# Patient Record
Sex: Male | Born: 1960 | Race: White | Hispanic: No | Marital: Married | Smoking: Former smoker
Health system: Southern US, Community
[De-identification: ages and names within clinical notes are randomized; demographics above are authoritative.]

## PROBLEM LIST (undated history)

## (undated) DIAGNOSIS — I480 Paroxysmal atrial fibrillation: Secondary | ICD-10-CM

## (undated) DIAGNOSIS — I2510 Atherosclerotic heart disease of native coronary artery without angina pectoris: Secondary | ICD-10-CM

## (undated) DIAGNOSIS — R55 Syncope and collapse: Secondary | ICD-10-CM

## (undated) DIAGNOSIS — F419 Anxiety disorder, unspecified: Secondary | ICD-10-CM

## (undated) DIAGNOSIS — Z87891 Personal history of nicotine dependence: Secondary | ICD-10-CM

## (undated) DIAGNOSIS — M5030 Other cervical disc degeneration, unspecified cervical region: Secondary | ICD-10-CM

## (undated) DIAGNOSIS — E785 Hyperlipidemia, unspecified: Secondary | ICD-10-CM

## (undated) DIAGNOSIS — G4730 Sleep apnea, unspecified: Secondary | ICD-10-CM

## (undated) DIAGNOSIS — K219 Gastro-esophageal reflux disease without esophagitis: Secondary | ICD-10-CM

## (undated) HISTORY — DX: Atherosclerotic heart disease of native coronary artery without angina pectoris: I25.10

## (undated) HISTORY — DX: Hyperlipidemia, unspecified: E78.5

## (undated) HISTORY — DX: Syncope and collapse: R55

## (undated) HISTORY — DX: Personal history of nicotine dependence: Z87.891

## (undated) HISTORY — DX: Other cervical disc degeneration, unspecified cervical region: M50.30

---
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
## 2004-03-29 ENCOUNTER — Observation Stay (HOSPITAL_COMMUNITY): Admission: EM | Admit: 2004-03-29 | Discharge: 2004-03-29 | Payer: Self-pay | Admitting: Emergency Medicine

## 2008-02-02 ENCOUNTER — Encounter: Payer: Self-pay | Admitting: Thoracic Surgery (Cardiothoracic Vascular Surgery)

## 2008-02-02 ENCOUNTER — Ambulatory Visit: Payer: Self-pay | Admitting: Thoracic Surgery (Cardiothoracic Vascular Surgery)

## 2008-02-02 ENCOUNTER — Inpatient Hospital Stay (HOSPITAL_COMMUNITY): Admission: AD | Admit: 2008-02-02 | Discharge: 2008-02-10 | Payer: Self-pay | Admitting: Cardiovascular Disease

## 2008-02-06 ENCOUNTER — Encounter: Payer: Self-pay | Admitting: Thoracic Surgery (Cardiothoracic Vascular Surgery)

## 2008-02-06 HISTORY — PX: CORONARY ARTERY BYPASS GRAFT: SHX141

## 2008-03-05 ENCOUNTER — Encounter
Admission: RE | Admit: 2008-03-05 | Discharge: 2008-03-05 | Payer: Self-pay | Admitting: Thoracic Surgery (Cardiothoracic Vascular Surgery)

## 2008-03-05 ENCOUNTER — Ambulatory Visit: Payer: Self-pay | Admitting: Thoracic Surgery (Cardiothoracic Vascular Surgery)

## 2008-03-08 ENCOUNTER — Encounter (HOSPITAL_COMMUNITY): Admission: RE | Admit: 2008-03-08 | Discharge: 2008-03-28 | Payer: Self-pay | Admitting: Cardiovascular Disease

## 2008-03-30 ENCOUNTER — Encounter (HOSPITAL_COMMUNITY): Admission: RE | Admit: 2008-03-30 | Discharge: 2008-06-28 | Payer: Self-pay | Admitting: Cardiovascular Disease

## 2010-01-03 ENCOUNTER — Ambulatory Visit: Payer: Self-pay | Admitting: Cardiovascular Disease

## 2010-08-12 NOTE — Cardiovascular Report (Signed)
NAME:  BAYDEN, GIL NO.:  [id number]   MEDICAL RECORD NO.:  [id number]          PATIENT TYPE:  INP   LOCATION:  3712                         FACILITY:  [facility]   PHYSICIAN:  [name], M.D. DATE OF BIRTH:  1960/06/02   DATE OF PROCEDURE:  02/02/2008  DATE OF DISCHARGE:                            CARDIAC CATHETERIZATION   Jacob Gibbs is a 50 year old gentleman with a history of  hyperlipidemia, obesity, and remote history of cigarette smoking.  He  has a very strong family history of coronary artery disease.  He was  admitted with episodes of unstable angina for heart catheterization.   The procedure was left heart catheterization with coronary angiography.   The right femoral artery was easily cannulated using modified Seldinger  technique.   HEMODYNAMICS:  LV pressure is 116/9 with an aortic pressure of 114/72.   ANGIOGRAPHY:  Left main:  The left main is fairly large and is somewhat  ectatic.  Left anterior descending artery was previously a very large  vessel.  It was perhaps 4 mm in size.  There is a mild stenosis at the  ostium.  It then tapers to about a 1-1.5 mm vessel.  The LAD is very  heavily calcified.  There are several 30-40% stenoses in the mid vessel.  There are several diagonal vessels which are unremarkable.   Left circumflex artery is large and is codominant.  The first obtuse  marginal artery is relatively small and is unremarkable.  The second  obtuse marginal artery is also small and unremarkable.  The third obtuse  marginal artery is much larger and has a 99% stenosis at its mid  segment.  The distal circumflex artery has minor luminal irregularities  and the posterior descending artery is unremarkable.   The right coronary artery is small and is codominant in size.  There is  a 50% proximal stenosis.  The small posterior descending artery is  unremarkable.   The left ventriculogram was performed in a 30-RAO position.  It  reveals  overall normal left ventricular systolic function.  The ejection  fraction is about 55%.   COMPLICATIONS:  None.   CONCLUSIONS:  Moderate-to-severe coronary artery disease.  The culprit  lesion is clearly the obtuse marginal artery, but I an concerned about  the significant lesion in the proximal left anterior descending artery.  It is very  heavily calcified and in addition, the caliber of the vessel changes  from the proximal to mid vessel quite significantly.  We will review the  films.  We will consult the surgeons for their opinion.  We will discuss  the case further with the family this afternoon or tomorrow.      [name], M.D.  Electronically Signed     PJN/MEDQ  D:  02/02/2008  T:  02/02/2008  Job:  [id number]

## 2010-08-12 NOTE — Op Note (Signed)
Jacob Gibbs, Jacob Gibbs NO.:  [id number]   MEDICAL RECORD NO.:  [id number]          PATIENT TYPE:  INP   LOCATION:  2303                         FACILITY:  [facility]   PHYSICIAN:  Salvatore Decent. Cornelius Moras, M.D. DATE OF BIRTH:  1961/01/30   DATE OF PROCEDURE:  02/06/2008  DATE OF DISCHARGE:                               OPERATIVE REPORT   PREOPERATIVE DIAGNOSIS:  Severe 3-vessel coronary artery disease.   POSTOPERATIVE DIAGNOSIS:  Severe 3-vessel coronary artery disease.   PROCEDURE:  Median sternotomy for coronary artery bypass grafting x3  (right internal mammary artery to distal left anterior descending  coronary artery, left internal mammary artery to third circumflex  marginal branch, saphenous vein graft to distal right coronary artery,  endoscopic saphenous vein harvest from right thigh).   SURGEON:  Salvatore Decent. Cornelius Moras, MD   ASSISTANT:  Sheliah Plane, MD   SECOND ASSISTANT:  Coral Ceo, PA   ANESTHESIA:  General.   BRIEF CLINICAL NOTE:  The patient is a 50 year old male with no previous  history of coronary artery disease but multiple risk factors.  The  patient presents with unstable angina.  Cardiac catheterization  demonstrates severe 3-vessel coronary artery disease with normal left  ventricular function.  A full consultation has been dictated previously.  After considerable debate, the patient has decided to proceed with  elective surgical revascularization.  He understands and accepts all  potential associated risks of surgery including but not limited to risk  of death, stroke, myocardial infarction, congestive heart failure,  respiratory failure, pneumonia, bleeding requiring blood transfusion,  arrhythmia, infection, and recurrent coronary artery disease.  Alternative treatment strategies have been discussed.  All his questions  have been addressed.   OPERATIVE FINDINGS:  1. Normal left ventricular systolic function.  2. Good-quality left and right  internal mammary artery conduit for      grafting.  3. Good-quality saphenous vein conduit for grafting.  4. Good-quality target vessels for grafting, although the distal right      coronary artery was quite small.   OPERATIVE DETAILS:  The patient was brought to the operating room on the  above-mentioned date and central monitoring was established by the  anesthesia team under the care and direction of Dr. Sheldon Silvan.  Specifically, a Swan-Ganz catheter was placed through the right internal  jugular approach.  A radial arterial line was placed.  Intravenous  antibiotics were administered.  Following induction with general  endotracheal anesthesia, a Foley catheter was placed.  The patient's  chest, abdomen, both groins, and both lower extremities were prepared  and draped in a sterile manner.  Baseline transesophageal echocardiogram  was performed by Dr. Ivin Booty.  This confirms the presence of normal left  ventricular systolic function.   A median sternotomy incision was performed and the left internal mammary  artery was dissected from the chest wall and prepared for bypass  grafting.  The left internal mammary artery was a good-quality conduit.  Following this, the right internal mammary artery was also dissected  from the chest wall and prepared for bypass grafting.  The right  internal mammary  artery was a good-quality conduit for grafting.  Simultaneously, saphenous vein was obtained from the patient's right  thigh using endoscopic vein harvest technique.  The saphenous vein was a  good-quality conduit.  After the saphenous vein had been removed, the  small incision in the right thigh was closed in multiple layers with  running absorbable suture.  The patient was heparinized systemically and  both internal mammary arteries were transected distally.  They both had  excellent flow.   The pericardium was opened.  The ascending aorta was normal in  appearance.  The ascending aorta  and the right atrium were cannulated  for cardiopulmonary bypass.  Adequate heparinization was verified.  Cardiopulmonary bypass was begun and the surface of the heart was  inspected.  Distal target vessels were selected for coronary bypass  grafting.  A temperature probe was placed in the left ventricular  septum.  A cardioplegic catheter was placed in the ascending aorta.  The  patient was allowed to cool passively to 32 degrees systemic  temperature.  The aortic cross-clamp was applied and cold blood  cardioplegia was administered initially in an antegrade fashion through  the aortic root.  Iced saline slush was applied for topical hypothermia.  The initial cardioplegic arrest and myocardial cooling was felt to be  excellent.  Repeat doses of cardioplegia were administered  intermittently throughout the cross-clamp portion of the operation  through the aortic root and down the subsequently placed vein graft to  maintain left ventricular septal temperature below 15 degrees  centigrade.   The following distal coronary anastomoses were performed:  1. The distal right coronary artery was grafted with the saphenous      vein graft in an end-to-side fashion.  This vessel measured 1.4 mm      in diameter and is a fair-quality target vessel for grafting.  2. The third circumflex marginal branch was grafted with the left      internal mammary artery in an end-to-side fashion.  This vessel      measures 2.0 mm in diameter and is a good-quality target vessel for      grafting.  3. The distal left anterior descending coronary artery was grafted      with the right internal mammary artery in an end-to-side fashion.      The right internal mammary artery was utilized in situ.  The left      anterior descending coronary artery measures 2.0 mm in diameter and      is a good-quality target vessel for grafting.   The single proximal saphenous vein anastomoses is performed directly to  the ascending  aorta prior to removal of the aortic cross-clamp.  The  left ventricular septal temperature rises rapidly with reperfusion of  the internal mammary artery grafts.  The heart began to beat  spontaneously.  The aortic cross-clamp was removed after a total cross-  clamp time of 85 minutes.  All proximal and distal coronary anastomoses  were inspect for hemostasis and appropriate graft orientation.  Epicardial pacing wires were fixed to the right ventricular free wall  into the right atrial appendage.  The patient was rewarmed to 37 degrees  centigrade temperature.  The patient was weaned from cardiopulmonary  bypass without difficulty.  The patient's rhythm at separation from  bypass was normal sinus rhythm.  No inotropic support was required.  Atrial pacing was employed to increase the heart rate.  Total  cardiopulmonary bypass time was 96 minutes.   Followup  transesophageal echocardiogram performed by Dr. Ivin Booty  demonstrates normal left ventricular function.  The venous and arterial  cannulae were removed uneventfully.  Protamine was administered to  reverse the anticoagulation.  The mediastinum and both left and right  pleural spaces were irrigated with saline solution.  Meticulous surgical  hemostasis was ascertained.  The mediastinum and both left and right  pleural spaces were drained with 4 chest tubes exited through separate  stab incisions inferiorly.  The pericardium and soft tissues anterior to  the aorta were reapproximated loosely.  The sternum was closed with  double-strength sternal wire.  The On-Q continuous pain management  system was utilized to facilitate postoperative pain control.  Two 10-  inch catheters supplied with the On-Q kit were tunneled into the deep  subcutaneous tissues and positioned just lateral to the lateral border  of the sternum on either side.  Each catheter was flushed with 0.5%  bupivacaine solution and ultimately connected to a continuous infusion   pump.  The soft tissues anterior to the sternum were closed in multiple  layers and the skin was closed with running subcuticular skin closure.   The patient tolerated the procedure well and was transported to the  Surgical Intensive Care Unit in a stable condition.  There were no  intraoperative complications.  All sponge, instrument, and needle counts  were verified and correct at completion of the operation.  No blood  products were administered.      Salvatore Decent. Cornelius Moras, M.D.  Electronically Signed     CHO/MEDQ  D:  02/06/2008  T:  02/07/2008  Job:  [id number]   cc:   [name], M.D.

## 2010-08-12 NOTE — Consult Note (Signed)
NAMEANNETTE, Gibbs NO.:  [id number]   MEDICAL RECORD NO.:  [id number]          PATIENT TYPE:  INP   LOCATION:  3712                         FACILITY:  [facility]   PHYSICIAN:  Salvatore Decent. Cornelius Moras, M.D. DATE OF BIRTH:  07/28/1960   DATE OF CONSULTATION:  02/02/2008  DATE OF DISCHARGE:                                 CONSULTATION   REQUESTING PHYSICIAN:  [name], MD   REASON FOR CONSULTATION:  Severe three-vessel coronary artery disease  with class IV unstable angina.   HISTORY OF PRESENT ILLNESS:  Mr. Jacob Gibbs is a 50 year old white male  with no previous history of coronary artery disease, but risk factors  notable for history of hyperlipidemia, a strong family history of  coronary artery disease, and a previous history of tobacco use.  The  patient states that over the last 1-2 weeks he has had new onset of  burning substernal chest pain radiating down his left arm that initially  has been typically brought on with exercise and relieved by rest.  The  pain became acutely worse yesterday, prompting him to present to Dr.  Elease Hashimoto for evaluation.  Baseline electrocardiogram revealed T-wave  inversions in the inferior leads.  He was subsequently brought in for  elective cardiac catheterization today.  However, baseline cardiac  enzymes were abnormal with elevated CK-MB and troponin I levels.  Cardiac catheterization demonstrates severe three-vessel coronary artery  disease with normal left ventricular function.  Cardiothoracic surgical  consultation was requested to consider surgical revascularization versus  percutaneous approach.   REVIEW OF SYSTEMS:  GENERAL:  The patient reports normal appetite.  He  has actually been losing weight on the diet recently, and initially he  blamed all of his burning chest pain on indigestion.  CARDIAC:  The  patient describes a 1- to-2-week history of accelerating symptoms of  burning substernal chest pain that have  typically been brought on with  physical exertion and relieved by rest.  He had a more severe episode of  pain yesterday with numbness radiating down his left arm.  The patient  describes no history of exertional shortness of breath.  The patient  denies PND, orthopnea, or lower extremity edema.  Has not had  palpitations or syncope.  RESPIRATORY:  Negative.  The patient denies  productive cough, hemoptysis, or wheezing.  GASTROINTESTINAL:  Negative.  The patient has no difficulty swallowing.  He denies hematochezia,  hematemesis, or melena.  MUSCULOSKELETAL:  Notably only in that he  pulled muscle in his calf muscle several weeks ago playing tennis.  He  denies significant arthritis or arthralgias.  NEUROLOGIC:  Negative.  The patient denies transient monocular blindness.  The patient denies  transient numbness or weakness involving either upper or lower  extremity.  GENITOURINARY:  Negative.  HEENT:  Negative.   PAST MEDICAL HISTORY:  1. Hyperlipidemia.  2. Remote tobacco use.   FAMILY HISTORY:  Notable for several family members with premature  coronary artery disease.   SOCIAL HISTORY:  The patient is married and works as a Merchandiser, retail.  He  has a remote history  of tobacco use, although he quit smoking.  He  reports only occasional alcohol consumption.   MEDICATIONS PRIOR TO ADMISSION:  Include aspirin and Crestor.   ALLERGIES:  Include IV CONTRAST.   PHYSICAL EXAMINATION:  GENERAL:  The patient is a well-appearing white  male who appears his stated age in no acute distress.  HEENT:  Unrevealing.  NECK:  Supple.  There is no jugular venous distention.  There is no  palpable lymphadenopathy.  CHEST:  Auscultation of the chest demonstrates clear breath sounds,  which are symmetrical bilaterally.  No wheezes or rhonchi demonstrated.  CARDIOVASCULAR:  Regular rate and rhythm.  No murmurs, rubs, or gallops  are noted.  ABDOMEN:  Soft, nondistended, and nontender.  Bowel sounds  are present.  EXTREMITIES:  Warm and well perfused.  There is no lower extremity  edema.  Distal pulses are palpable in both lower legs at the ankle.  Allen test appears intact in left hand.  There is no sign of venous  insufficiency.  RECTAL AND GU:  Both deferred.  NEUROLOGIC:  Grossly nonfocal.   DIAGNOSTIC TESTS:  Cardiac catheterization performed today by Dr. Elease Hashimoto  is reviewed.  This demonstrates severe three-vessel coronary artery  disease with normal left ventricular function.  Specifically, there is  codominant coronary circulation.  There is long segment 70 and 80%  stenoses of the proximal left anterior descending coronary artery  beginning at the ostial portion of this vessel and extending over  considerable distance, including the takeoff of a large septal  perforating branch.  This is very heavily calcified and in my opinion  this would be relatively unfavorable for percutaneous coronary  intervention.  The left circumflex coronary artery is a large vessel.  This gives rise to a large third circumflex marginal branch that has 95-  99% high-grade stenosis in the midportion of this vessel.  There is  codominant coronary circulation with a small to medium size right  coronary artery that has 70% proximal stenosis.  Left ventricular  function appears normal.   IMPRESSION:  Severe three-vessel coronary artery disease with normal  left ventricular function and unstable angina.  Mr. Thorpe also has  abnormal cardiac enzymes consistent with mild non-ST-segment elevation  myocardial infarction.  I believe he would best be treated with surgical  revascularization.  I believe the proximal left anterior descending  coronary artery stenosis is quite severe and should not be left alone,  although I do agree that the left circumflex lesion may in fact be the  culprit in terms of what prompted the patient's acute presentation.  I  feel the proximal left anterior descending coronary  artery stenosis  appears relatively unfavorable for percutaneous coronary intervention,  and as such the patient's long-term survival and risk of future cardiac  event probably all be best served with surgical revascularization.   PLAN:  I have discussed matters at length with Mr. Pannone this  evening.  Risks and benefits of all approaches have been discussed.  All  their questions have been addressed.  He desires to think matters over  further before making the final decision.      Salvatore Decent. Cornelius Moras, M.D.  Electronically Signed     CHO/MEDQ  D:  02/02/2008  T:  02/03/2008  Job:  [id number]   cc:   [name], M.D.

## 2010-08-12 NOTE — Assessment & Plan Note (Signed)
OFFICE VISIT   Jacob Gibbs, Jacob Gibbs  DOB:  [birth year]                                        March 05, 2008  CHART #:  [id number]   The patient comes in today for a postoperative 3-week followup.  He is  status post coronary artery bypass grafting x3 on February 06, 2008.  Postoperatively, he did well and was able to be discharged home on  postop day #5.  Since his discharge, he has continued to progress  nicely.  He saw Dr. Elease Hashimoto for followup last week and at that time, his  Lopressor dose was decreased to 12.5 mg daily and his Crestor dose  decreased to 20 mg daily.  He is having minimal pain and is walking  approximately 40 minutes a day without problem.  He denies any chest  discomfort or shortness of breath.  He is enrolled in cardiac  rehabilitation and will be starting next week.  He otherwise, has no  complaints today.   PHYSICAL EXAMINATION:  VITAL SIGNS:  Blood pressure 114/65, pulse is 78,  respirations 16, and O2 sat 97% on room air.  CHEST:  His sternal and right leg incisions are well healed.  His  sternum is stable to palpation.  HEART:  Regular rate and rhythm without murmurs, rubs, or gallops.  LUNGS:  Clear to auscultation.  EXTREMITIES:  No edema.   Chest x-ray does show that the lowermost sternal wire is displaced, but  this is unchanged from his initial postoperative chest x-ray in the  hospital as well as all subsequent x-rays since.   ASSESSMENT AND PLAN:  The patient is doing well 3 weeks status post  coronary artery bypass graft.  At this point, he may resume driving as  well as his normal activities including  returning to work.  He is encouraged to pursue cardiac rehabilitation as  it is planned.  We will see him back on p.r.n. basis for followup.   Salvatore Decent. Cornelius Moras, M.D.  Electronically Signed   GC/MEDQ  D:  03/05/2008  T:  03/05/2008  Job:  [id number]   cc:   [name], M.D.

## 2010-08-12 NOTE — Discharge Summary (Signed)
NAMEMARSHAL, ESKEW NO.:  [id number]   MEDICAL RECORD NO.:  [id number]          PATIENT TYPE:  INP   LOCATION:  2020                         FACILITY:  [facility]   PHYSICIAN:  Salvatore Decent. Cornelius Moras, M.D. DATE OF BIRTH:  1961-01-27   DATE OF ADMISSION:  02/02/2008  DATE OF DISCHARGE:                               DISCHARGE SUMMARY   ADMITTING DIAGNOSES:  1. 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  2. History of hyperlipidemia.  3. History of remote tobacco use.   DISCHARGE DIAGNOSES:  1. 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  2. History of hyperlipidemia.  3. History of remote tobacco use.  4. Thrombocytopenia.   PROCEDURES:  1. Cardiac catheterization done on February 02, 2008, by Dr. Elease Hashimoto.      Findings showed an EF of 55%, a 30-40% stenosis in the midvessel of      the LAD, mild stenosis at the ostium, and a 99% stenosis at the      midsegment of OM-3 as well as at least a 50% proximal stenosis of      the RCA.  2. Coronary artery bypass grafting x3, LIMA to distal LAD, LIMA to OM-      3, and SVG to distal RCA with [facility] and right VATS and he has had a      right thigh on February 06, 2008, by Dr. Cornelius Moras.   HISTORY OF PRESENTING ILLNESS:  This is a 50 year old Caucasian male  with past medical history of hyperlipidemia and remote tobacco use.  According to medical records, over the last 1-2 weeks, he had complaints  of burning substernal chest pain that radiated down into his left arm.  Initially, this occurred upon exertion and was relieved by rest.  However, the chest pain continued to worsen and he presented to Dr.  Harvie Bridge office for further evaluation.  Baseline EKG showed T-wave  inversions in the inferior leads.  He was admitted to [facility] for further evaluation and treatment.  Initial cardiac enzymes  revealed the CK-MB to be elevated at 10.4, relative index 5.5, and  troponin 0.92.  He then underwent a cardiac catheterization by Dr.  Elease Hashimoto with the aforementioned findings.  A cardiothoracic consultation  was obtained with Dr. Cornelius Moras.  The patient underwent coronary artery  bypass grafting surgery x3, on February 06, 2008.  He was extubated later  in the day of surgery.  He remained afebrile and hemodynamically stable.  Drips were weaned as tolerated.  He was still in the need of the drip  the morning of postop day #1.  This was gradually weaned off.  It should  also be noted he was AI paced initially.  Chest tubes were removed on  postop day #1.  Followup chest x-ray revealed no pneumothorax and  atelectasis of the bases.  He was volume overloaded and diuresed  accordingly.  He was slowly progressing with cardiac rehab.  After the  neo was stopped, he was transferred from the Intensive Care Unit  to the  Step-down Unit for further convalescence.  Currently on postop day #3,  he had a T-max of 101 on the evening of February 08, 2008, and he was  afebrile this a.m.  Heart rate is 80s-90s.  BP 96/66, prior systolic  blood pressure 102-111.  O2 sat 93% on room air.  Preop weight 99 kg,  today's weight down to 101.1 kg.   PHYSICAL EXAMINATION:  CARDIOVASCULAR:  Regular rate and rhythm.  PULMONARY:  Slightly diminished at the bases, but clear.  ABDOMEN:  Soft and nontender.  Bowel sounds present.  EXTREMITIES:  Trace edema.  Wounds clean and dry.  Positive ecchymosis  of the right thigh.   Provided the patient remains afebrile and hemodynamically stable, he  will be discharged on February 10, 2008.  The patient with fever (white  blood cell count normal), likely secondary to pulmonary (atelectasis).  We will continue to monitor.   LAST LABORATORY STUDIES:  BMET done on February 09, 2008, potassium 4,  BUN and creatinine 10 and 0.87 respectively.  Last CBC done also on this  date, H&H 10.1 and 28.9, white blood cell count of 800, platelet count  143,000.   Last chest x-ray done today as well as revealed by basilar  atelectasis, questionable infiltrative densities, bilateral pleural  effusions right greater than left, and cardiomegaly.   DISCHARGE INSTRUCTIONS:  Include the following:  The patient is not to  drive or lift more than 10 pounds.  He is to continue with his breathing  exercise daily, to walk every day, and increase his frequency and  duration as tolerated.  He is to remain on a low-fat, low-salt diet.  He  may shower.  He is to cleanse his wounds with mild soap and water.  He  is to call the office if any wound problems arise.   FOLLOWUP APPOINTMENTS:  1. The patient is to contact Dr. Harvie Bridge office for followup      appointment in 2 weeks.  2. The patient has appointment to see Dr. Orvan July physician assistant      on March 10, 2008, at 1 p.m.  Prior to this office appointment,      a chest x-ray will be obtained.   DISCHARGE MEDICATIONS:  Include the following:  1. Enteric-coated aspirin 325 mg p.o. daily.  2. Lopressor 12.5 mg p.o. 2 times daily.  3. Crestor 40 mg p.o. at bedtime.  4. Oxycodone 5 mg 1-2 tablets p.o. q.4-6 h. as needed for pain.  5. Maalox as directed p.r.n.   In addition, it will be determined whether or not the patient will  require several days of Lasix and potassium upon discharge.      Doree Fudge, PA      [place] H. Cornelius Moras, M.D.  Electronically Signed    DZ/MEDQ  D:  02/09/2008  T:  02/10/2008  Job:  [id number]   cc:   [name], M.D.

## 2010-08-15 NOTE — H&P (Signed)
NAME:  Jacob Gibbs, Jacob Gibbs NO.:  [id number]   MEDICAL RECORD NO.:  [id number]          PATIENT TYPE:  EMS   LOCATION:  MAJO                         FACILITY:  [facility]   PHYSICIAN:  Elmore Guise., M.D.DATE OF BIRTH:  1960-12-29   DATE OF ADMISSION:  03/29/2004  DATE OF DISCHARGE:                                HISTORY & PHYSICAL   PRIMARY CARDIOLOGIST:  Dr. Deloris Ping. Nahser.   REASON FOR ADMISSION:  1.  Syncope.  2.  New onset atrial fibrillation.   HISTORY OF PRESENT ILLNESS:  The patient is a 50 year old white male with no  significant past medical history who presents to the [facility] ER after a  syncopal episode.  The patient reports a normal state of health, actually  exercising up to 40 minutes yesterday without any significant problems.  Today, he recalls the following events.  He took one Cialis at approximately  3 p.m. and had some alcohol.  On further questioning, he notes this as  four glasses of Champagne.  This was followed by sexual activity.  After  that, he went down to the local hotel bar and started to smoke some  cigarettes.  He quickly turned to talk to his wife.  He felt nauseous, light-  headed and sweaty.  He lost his balance, fell off the bar stool and passed  out.  On regaining his consciousness, he felt his heart racing.  EMS was  called.  On arrival by EMS, the patient was found to be in new onset atrial  fibrillation.  The patient also notes decreased p.o. intake over the last  couple of hours, just cheese and crackers for dinner. He also notes  increased stress in the last couple of weeks.  Otherwise, he denies any  significant palpitations, chest pain, dyspnea on exertion, lower-extremity  edema, orthopnea, PND.  He does have disk disease in his upper neck and  occasional numbness in his right arm.   REVIEW OF SYSTEMS:  Positive for occasional sinus problems, occasional neck  pain.  All others were negative.   CURRENT  MEDICATIONS:  1.  Advair on a p.r.n. basis.  2.  Flonase on p.r.n. basis.  3.  Cialis on p.r.n. basis.   ALLERGIES:  None.   FAMILY HISTORY:  Positive for heart disease in his mother in her 19's and  uncle in his 36's.   SOCIAL HISTORY:  He is married.  He has used off and on tobacco.  He does  have occasional alcohol.  He also uses herbal medicine with daily vitamin  packs.   He had a stress test approximately five years ago which he describes as  normal.   PHYSICAL EXAMINATION:  VITAL SIGNS:  Blood pressure 122/70, temperature  98.6, pulse is 122 and irregular.  O2 saturations are 100% on two liters.  GENERAL:  He is alert and oriented x4.  HEENT:  Normal.  NECK:  Supple, no lymphadenopathy, 2+ carotids.  No JVD, no bruits.  LUNGS:  Clear.  HEART:  Regular with no significant murmurs, gallops or rubs.  ABDOMEN:  Soft, nontender,  nondistended.  EXTREMITIES:  Warm with 2+ pulses and no edema.  SKIN:  No rashes or significant bruises noted.  NEUROLOGIC:  He is intact with no focal deficits.   EKG:  Showed atrial fibrillation at a rate of 122 per minute, normal axis.  Normal QRS.  No significant ST-T wave changes noted.   Telemetry showed the patient spontaneously converting to normal sinus rhythm  during the exam.   LABORATORY DATA:  Shows hemoglobin of 16. BUN and creatinine of 17 and 1.0.  Potassium is 4.0.  Cardiac enzymes are myoglobin of 85, CPK-MB of 1.2, and  troponin I of less than 0.05.   IMPRESSION:  1.  Syncope, most likely secondary to a combination of recent alcohol      intake, Cialis, and possibly due to decreased p.o. intake.  2.  Recent atrial fibrillation, also most likely secondary to alcohol and      Cialis as well as increased stress here lately.   PLAN:  1.  We will admit for observation.  2.  Use beta-blockade to help limit any atrial fibrillation occurrences.  3.  TSH is pending.  4.  Alcohol level is pending.  5.  We will check EKG in the  morning, repeat cardiac enzymes in the morning.  6.  If he remains negative with his blood work as well as no further      recurrence of atrial fibrillation, we will discharge him home with      appropriate followup with Dr. Deloris Ping Nahser in approximately one week      for outpatient echocardiogram and potential for stress test.  7.  I did encourage him to avoid alcohol use with Cialis in the future.       TWK/MEDQ  D:  03/29/2004  T:  03/29/2004  Job:  [id number]   cc:   [name], M.D.  [address], [address]  [place]  [place] [address]  Fax: [phone]

## 2010-08-18 ENCOUNTER — Encounter: Payer: Self-pay | Admitting: Internal Medicine

## 2010-08-18 ENCOUNTER — Ambulatory Visit: Payer: Self-pay | Admitting: Internal Medicine

## 2010-08-19 ENCOUNTER — Ambulatory Visit (INDEPENDENT_AMBULATORY_CARE_PROVIDER_SITE_OTHER): Payer: 59 | Admitting: Internal Medicine

## 2010-08-19 ENCOUNTER — Encounter: Payer: Self-pay | Admitting: Internal Medicine

## 2010-08-19 VITALS — BP 114/78 | HR 64 | Resp 18 | Ht 69.0 in | Wt 212.0 lb

## 2010-08-19 DIAGNOSIS — E785 Hyperlipidemia, unspecified: Secondary | ICD-10-CM | POA: Insufficient documentation

## 2010-08-19 DIAGNOSIS — I2510 Atherosclerotic heart disease of native coronary artery without angina pectoris: Secondary | ICD-10-CM

## 2010-08-19 NOTE — Progress Notes (Signed)
PCP: No PCP  HPI:  Jacob Gibbs is a 50 y/o male with h/o PAF, HL and CAD s/p NSTEMI with 3-v CABG in 2009 (RIMA - LAD, LIMA - OM3, SVG- RCA). Previously followed by Dr. Elease Hashimoto and presents today for a new patient evaluation to transfer care.    Prior to NSTEMI in 2009 did not have many symptoms. Mild tightness in back of throat. Cath with 3-v CAD. Did well post CABG and has not had any problems since.   Remains very active. Plays doubles tennis several days a week.  Also uses elliptical and rowing machine and walks around [facility] No CP, dyspnea, throat tightness or palpitations.   Has been on Toprol in past but has not been on it recently. Compliant with Crestor and Niaspan. Occasionally flushes with niacin. Snores mildly. No witnessed apnea.    ROS: All other systems normal except as mentioned in HPI, past medical history and problem list.    Past Medical History  Diagnosis Date  . Coronary artery disease     EF 55%; MULTIVESSEL s/p CABG 2009  . Hyperlipidemia   . History of tobacco use     REMOTE  . Thrombocytopenia   . Atrial fibrillation     2005  . Syncope and collapse   . DDD (degenerative disc disease), cervical     Current Outpatient Prescriptions  Medication Sig Dispense Refill  . aspirin 325 MG EC tablet Take 325 mg by mouth daily.        . niacin (NIASPAN) 1000 MG CR tablet Take 1,000 mg by mouth at bedtime.        . NON FORMULARY ELEMENTAL L ARGININE daily       . omega-3 acid ethyl esters (LOVAZA) 1 G capsule Take 2 g by mouth 2 (two) times daily.        . rosuvastatin (CRESTOR) 10 MG tablet Take 10 mg by mouth daily.        . Testosterone (ANDROGEL PUMP) 1.25 GM/ACT (1%) GEL Place onto the skin. As directed        . DISCONTD: metoprolol tartrate (LOPRESSOR) 25 MG tablet Take 25 mg by mouth 2 (two) times daily. TAKE 1/2 TABLET TWICE DAILY       . DISCONTD: rosuvastatin (CRESTOR) 40 MG tablet Take 40 mg by mouth daily.        Marland Kitchen DISCONTD: Simethicone (MAALOX  ANTI-GAS PO) Take by mouth as needed.        Marland Kitchen DISCONTD: Tadalafil (CIALIS PO) Take by mouth as directed.           Allergies  Allergen Reactions  . Contrast Media (Iodinated Diagnostic Agents)     History   Social History  . Marital Status: Married    Spouse Name: N/A    Number of Children: N/A  . Years of Education: N/A   Occupational History  . Not on file.   Social History Main Topics  . Smoking status: Smoker, Current Status Unknown  . Smokeless tobacco: Not on file  . Alcohol Use: Yes     OCCASIONAL  . Drug Use: No  . Sexually Active:    Other Topics Concern  . Not on file   Social History Narrative   MARRIEDTOBACCO USE ON AND OFFETOH OCCASIONALLYUSES HERBAL SUPP. ALONG WITH A DAILY VITAMIN PACKHAD STRESS TEST APPROX 5 YRSNEW ONSET AFIBSYNCOPE    Family History  Problem Relation Age of Onset  . Heart disease Mother     ALSO ONE OF  HIS UNCLES    PHYSICAL EXAM: Filed Vitals:   08/19/10 1113  BP: 114/78  Pulse: 64  Resp: 18   General:  Well appearing. No respiratory difficulty HEENT: normal Neck: supple. no JVD. Carotids 2+ bilat; no bruits. No lymphadenopathy or thryomegaly appreciated. Cor: PMI nondisplaced. Regular rate & rhythm. No rubs, gallops or murmurs. Lungs: clear Abdomen: soft, nontender, nondistended. No hepatosplenomegaly. No bruits or masses. Good bowel sounds. Extremities: no cyanosis, clubbing, rash, edema Neuro: alert & oriented x 3, cranial nerves grossly intact. moves all 4 extremities w/o difficulty. Affect pleasant.  ECG: NSR 64 No ST-T wave abnormalities.    ASSESSMENT & PLAN:

## 2010-08-19 NOTE — Progress Notes (Signed)
Addended by: Meredith Staggers on: 08/19/2010 12:23 PM   Modules accepted: Orders

## 2010-08-19 NOTE — Assessment & Plan Note (Signed)
Goal LDL < 70. Continue statin. Discussed AIM-HIGH trial and role of niacin. Will continue for now. Check CMET and Lipids. Continue to exercise and watch his weight.

## 2010-08-19 NOTE — Assessment & Plan Note (Addendum)
Doing well. No evidence of ischemia. Given limited symptomatology prior to previous CABG may consider routine ETT at next visit. Can decrease ASA to 81 daily

## 2010-08-19 NOTE — Patient Instructions (Addendum)
Your physician recommends that you return for a FASTING lipid profile: next week Thur 5/31 (bmet, liver, lipid 272.4, 414.01) Your physician wants you to follow-up in: 6 months. You will receive a reminder letter in the mail two months in advance. If you don't receive a letter, please call our office to schedule the follow-up appointment.

## 2010-08-28 ENCOUNTER — Other Ambulatory Visit (INDEPENDENT_AMBULATORY_CARE_PROVIDER_SITE_OTHER): Payer: 59 | Admitting: *Deleted

## 2010-08-28 DIAGNOSIS — I2510 Atherosclerotic heart disease of native coronary artery without angina pectoris: Secondary | ICD-10-CM

## 2010-08-28 DIAGNOSIS — E785 Hyperlipidemia, unspecified: Secondary | ICD-10-CM

## 2010-08-28 LAB — BASIC METABOLIC PANEL
CO2: 27 mEq/L (ref 19–32)
Calcium: 9 mg/dL (ref 8.4–10.5)
Chloride: 106 mEq/L (ref 96–112)
Creatinine, Ser: 0.9 mg/dL (ref 0.4–1.5)
GFR: 90.95 mL/min (ref >60.00)
Glucose, Bld: 91 mg/dL (ref 70–99)
Potassium: 4.1 mEq/L (ref 3.5–5.1)
Sodium: 141 mEq/L (ref 135–145)

## 2010-08-28 LAB — LIPID PANEL
Cholesterol: 114 mg/dL (ref 0–200)
HDL: 45.1 mg/dL (ref >39.00)
LDL Cholesterol: 44 mg/dL (ref 0–99)
Total CHOL/HDL Ratio: 3
Triglycerides: 124 mg/dL (ref 0.0–149.0)
VLDL: 24.8 mg/dL (ref 0.0–40.0)

## 2010-08-28 LAB — HEPATIC FUNCTION PANEL
ALT: 31 U/L (ref 0–53)
AST: 19 U/L (ref 0–37)
Albumin: 4.1 g/dL (ref 3.5–5.2)
Alkaline Phosphatase: 51 U/L (ref 39–117)
Bilirubin, Direct: 0 mg/dL (ref 0.0–0.3)
Total Bilirubin: 1 mg/dL (ref 0.3–1.2)
Total Protein: 6.6 g/dL (ref 6.0–8.3)

## 2010-09-29 ENCOUNTER — Telehealth: Payer: Self-pay | Admitting: Internal Medicine

## 2010-09-29 NOTE — Telephone Encounter (Signed)
Pt needing RX for Crestor 20 mg, Lovaza 1 g, Niaspan 1000mg   [facility] [phone] Fax #[phone]

## 2010-09-30 ENCOUNTER — Other Ambulatory Visit: Payer: Self-pay | Admitting: *Deleted

## 2010-09-30 MED ORDER — NIACIN ER (ANTIHYPERLIPIDEMIC) 1000 MG PO TBCR: 1000.0000 mg | EXTENDED_RELEASE_TABLET | Freq: Every day | ORAL | Status: DC

## 2010-09-30 MED ORDER — ROSUVASTATIN CALCIUM 10 MG PO TABS: 10.0000 mg | ORAL_TABLET | Freq: Every day | ORAL | Status: DC

## 2010-09-30 MED ORDER — OMEGA-3-ACID ETHYL ESTERS 1 G PO CAPS: 2.0000 g | ORAL_CAPSULE | Freq: Two times a day (BID) | ORAL | Status: DC

## 2010-11-28 ENCOUNTER — Telehealth: Payer: Self-pay | Admitting: Internal Medicine

## 2010-11-28 NOTE — Telephone Encounter (Signed)
Faxed Records (2010 - present) to Dr. Clelia Croft at [facility] ([phone]).

## 2010-12-30 LAB — PLATELET COUNT: Platelets: 197

## 2010-12-30 LAB — CBC
HCT: 29.4 — ABNORMAL LOW
HCT: 31.9 — ABNORMAL LOW
HCT: 32.1 — ABNORMAL LOW
HCT: 42.1
HCT: 43.6
HCT: 43.8
Hemoglobin: 10.8 — ABNORMAL LOW
Hemoglobin: 12.7 — ABNORMAL LOW
Hemoglobin: 14.3
Hemoglobin: 14.7
Hemoglobin: 14.9
Hemoglobin: 15.2
MCHC: 33.6
MCHC: 34.2
MCHC: 34.6
MCHC: 34.8
MCHC: 35.1
MCV: 86.6
MCV: 86.8
MCV: 87.6
MCV: 87.8
MCV: 88.5
MCV: 88.7
MCV: 88.9
MCV: 89.1
Platelets: 143 — ABNORMAL LOW
Platelets: 163
Platelets: 189
Platelets: 195
Platelets: 255
Platelets: 257
Platelets: 262
Platelets: 266
RBC: 3.24 — ABNORMAL LOW
RBC: 3.64 — ABNORMAL LOW
RBC: 4.26
RBC: 4.66
RBC: 4.81
RBC: 5.02
RBC: 5.42
RDW: 12.9
RDW: 13.3
RDW: 13.5
WBC: 10.9 — ABNORMAL HIGH
WBC: 13.8 — ABNORMAL HIGH
WBC: 16.2 — ABNORMAL HIGH
WBC: 18.2 — ABNORMAL HIGH
WBC: 7.3
WBC: 7.6
WBC: 8.4
WBC: 8.8
WBC: 8.9
WBC: 9.2
WBC: 9.4

## 2010-12-30 LAB — URINALYSIS, ROUTINE W REFLEX MICROSCOPIC
Protein, ur: NEGATIVE
Specific Gravity, Urine: 1.026
Urobilinogen, UA: 0.2

## 2010-12-30 LAB — BASIC METABOLIC PANEL
BUN: 10
BUN: 10
BUN: 14
BUN: 15
BUN: 9
CO2: 25
CO2: 26
CO2: 28
CO2: 28
Calcium: 8.3 — ABNORMAL LOW
Calcium: 8.5
Calcium: 9.2
Chloride: 103
Chloride: 105
Chloride: 106
Chloride: 107
Chloride: 99
Creatinine, Ser: 0.87
Creatinine, Ser: 0.88
Creatinine, Ser: 0.89
Creatinine, Ser: 0.93
GFR calc Af Amer: >60
GFR calc Af Amer: >60
GFR calc non Af Amer: >60
GFR calc non Af Amer: >60
GFR calc non Af Amer: >60
Glucose, Bld: 108 — ABNORMAL HIGH
Glucose, Bld: 115 — ABNORMAL HIGH
Glucose, Bld: 129 — ABNORMAL HIGH
Glucose, Bld: 134 — ABNORMAL HIGH
Glucose, Bld: 96
Potassium: 4
Potassium: 4
Potassium: 4.1
Potassium: 4.2
Sodium: 138
Sodium: 138
Sodium: 140

## 2010-12-30 LAB — APTT
aPTT: 33
aPTT: 34
aPTT: 89 — ABNORMAL HIGH

## 2010-12-30 LAB — POCT I-STAT 3, ART BLOOD GAS (G3+)
Acid-base deficit: 1
Acid-base deficit: 2
Bicarbonate: 19.6 — ABNORMAL LOW
Bicarbonate: 27.7 — ABNORMAL HIGH
O2 Saturation: 100
O2 Saturation: 100
O2 Saturation: 96
O2 Saturation: 99
Patient temperature: 37.1
TCO2: 20
TCO2: 29
TCO2: 29
pH, Arterial: 7.336 — ABNORMAL LOW
pH, Arterial: 7.385
pH, Arterial: 7.403
pO2, Arterial: 108 — ABNORMAL HIGH
pO2, Arterial: 121 — ABNORMAL HIGH

## 2010-12-30 LAB — BLOOD GAS, ARTERIAL
Acid-Base Excess: 1
Bicarbonate: 25.2 — ABNORMAL HIGH
FIO2: 0.21
O2 Saturation: 95.1
pCO2 arterial: 41.2
pO2, Arterial: 69.9 — ABNORMAL LOW

## 2010-12-30 LAB — COMPREHENSIVE METABOLIC PANEL
Albumin: 3.7
Alkaline Phosphatase: 70
BUN: 13
CO2: 26
Chloride: 104
Creatinine, Ser: 0.87
GFR calc non Af Amer: >60
Glucose, Bld: 91
Potassium: 3.9
Total Bilirubin: 0.5

## 2010-12-30 LAB — POCT I-STAT 4, (NA,K, GLUC, HGB,HCT)
Glucose, Bld: 102 — ABNORMAL HIGH
Glucose, Bld: 117 — ABNORMAL HIGH
Glucose, Bld: 131 — ABNORMAL HIGH
Glucose, Bld: 139 — ABNORMAL HIGH
Glucose, Bld: 154 — ABNORMAL HIGH
Glucose, Bld: 158 — ABNORMAL HIGH
HCT: 27 — ABNORMAL LOW
HCT: 40
HCT: 41
HCT: 43
Hemoglobin: 13.6
Potassium: 4.1
Potassium: 6.2 — ABNORMAL HIGH
Potassium: 6.4
Sodium: 126 — ABNORMAL LOW
Sodium: 130 — ABNORMAL LOW
Sodium: 133 — ABNORMAL LOW
Sodium: 136
Sodium: 138
Sodium: 138

## 2010-12-30 LAB — HEPARIN LEVEL (UNFRACTIONATED)
Heparin Unfractionated: 0.48
Heparin Unfractionated: 0.48
Heparin Unfractionated: 0.54
Heparin Unfractionated: <0.1 — ABNORMAL LOW

## 2010-12-30 LAB — CARDIAC PANEL(CRET KIN+CKTOT+MB+TROPI)
CK, MB: 10.4 — ABNORMAL HIGH
CK, MB: 10.7 — ABNORMAL HIGH
CK, MB: 7.6 — ABNORMAL HIGH
Relative Index: 5 — ABNORMAL HIGH
Relative Index: 5.5 — ABNORMAL HIGH
Relative Index: 7.7 — ABNORMAL HIGH
Total CK: 139
Total CK: 152
Total CK: 189
Troponin I: 0.67
Troponin I: 0.92

## 2010-12-30 LAB — LIPID PANEL
Cholesterol: 157
HDL: 26 — ABNORMAL LOW
LDL Cholesterol: 84
Total CHOL/HDL Ratio: 6

## 2010-12-30 LAB — GLUCOSE, CAPILLARY
Glucose-Capillary: 116 — ABNORMAL HIGH
Glucose-Capillary: 123 — ABNORMAL HIGH
Glucose-Capillary: 126 — ABNORMAL HIGH
Glucose-Capillary: 131 — ABNORMAL HIGH
Glucose-Capillary: 95

## 2010-12-30 LAB — POCT I-STAT 3, VENOUS BLOOD GAS (G3P V)
Acid-Base Excess: 1
Bicarbonate: 28.3 — ABNORMAL HIGH

## 2010-12-30 LAB — CREATININE, SERUM
Creatinine, Ser: 0.8
GFR calc non Af Amer: >60

## 2010-12-30 LAB — POCT I-STAT, CHEM 8
HCT: 36 — ABNORMAL LOW
Hemoglobin: 12.2 — ABNORMAL LOW
Potassium: 4.7
Sodium: 137
TCO2: 23

## 2010-12-30 LAB — POCT I-STAT GLUCOSE: Glucose, Bld: 151 — ABNORMAL HIGH

## 2010-12-30 LAB — HEMOGLOBIN A1C: Mean Plasma Glucose: 114

## 2010-12-30 LAB — PROTIME-INR
INR: 1
Prothrombin Time: 12.8

## 2010-12-30 LAB — MAGNESIUM
Magnesium: 2.4
Magnesium: 2.6 — ABNORMAL HIGH

## 2010-12-30 LAB — HEMOGLOBIN AND HEMATOCRIT, BLOOD: HCT: 31.3 — ABNORMAL LOW

## 2010-12-30 LAB — TYPE AND SCREEN

## 2011-06-24 ENCOUNTER — Encounter: Payer: Self-pay | Admitting: Internal Medicine

## 2011-07-08 ENCOUNTER — Telehealth: Payer: Self-pay | Admitting: Internal Medicine

## 2011-07-08 NOTE — Telephone Encounter (Signed)
New msg Pt wants to know should he schedule with another physician or still see Dr Gala Romney. Please let him know

## 2011-07-08 NOTE — Telephone Encounter (Signed)
Patient states he was referred to Dr. Gala Romney three years ego after his open heart surgery. He was made aware that Dr. Gala Romney comes to the office to seen patients once in a while, Md sees the CHF patients in his office in the hospital. Patient was offered to make an appointment with another cardiologist, he said will decide for himself first, then will call the office if needed.

## 2011-07-13 ENCOUNTER — Telehealth: Payer: Self-pay | Admitting: Internal Medicine

## 2011-07-13 NOTE — Telephone Encounter (Signed)
Spoke with pt, according to the list I have, dr bensimhon would like to follow this pt at the hosp. Number given to contact CHF clinic.

## 2011-07-13 NOTE — Telephone Encounter (Signed)
Patient returning call left on his vm, he can be reached at [phone].  He doesn't know who called or what they called for.

## 2011-07-14 ENCOUNTER — Telehealth (HOSPITAL_COMMUNITY): Payer: Self-pay | Admitting: *Deleted

## 2011-07-14 NOTE — Telephone Encounter (Signed)
Mr Armato called today for a 1 year follow up appointment.  I am not sure if we should follow up with him here or at [facility].  Please let me know. Thanks.

## 2011-07-15 NOTE — Telephone Encounter (Signed)
He can see Duck they are aware and will let him know

## 2011-07-22 ENCOUNTER — Telehealth: Payer: Self-pay | Admitting: Internal Medicine

## 2011-07-22 DIAGNOSIS — I2510 Atherosclerotic heart disease of native coronary artery without angina pectoris: Secondary | ICD-10-CM

## 2011-07-22 NOTE — Telephone Encounter (Signed)
Spoke with pt, according to dr bensimhon's last office note the pt would need ETT at next visit. Will discuss with dr Jesusita Oka an call pt back. The pt is agreeable with this plan.

## 2011-07-22 NOTE — Telephone Encounter (Signed)
Please return call to patient at [phone]   Patient has spoken with Dr. Jesusita Oka in the past and would like to know if he still wants him to have an ECho. Patient also has been given the run around about seeing Dr. Jesusita Oka @ [facility] or [facility].  Please return call to patient [phone].

## 2011-07-23 NOTE — Telephone Encounter (Signed)
Discussed with dr bensimhon, pt to have a GXT with the pa prior to the appt scheduled with dr bensimhon and an echo will be discussed at the time of the office visit with dr bensimhon.

## 2011-08-04 NOTE — Telephone Encounter (Signed)
Spoke with pt, aware of dr bensimhon's recommendations.

## 2011-09-09 ENCOUNTER — Ambulatory Visit (INDEPENDENT_AMBULATORY_CARE_PROVIDER_SITE_OTHER): Payer: 59 | Admitting: Physician Assistant

## 2011-09-09 ENCOUNTER — Encounter: Payer: Self-pay | Admitting: Physician Assistant

## 2011-09-09 DIAGNOSIS — I2510 Atherosclerotic heart disease of native coronary artery without angina pectoris: Secondary | ICD-10-CM

## 2011-09-09 NOTE — Procedures (Signed)
Exercise Treadmill Test  Pre-Exercise Testing Evaluation Rhythm: normal sinus  Rate: 76   PR:  .15 QRS:  .08  QT:  .38 QTc: .43     Test  Exercise Tolerance Test Ordering MD: Arvilla Meres, MD  Interpreting MD: Tereso Newcomer PA-C  Unique Test No: 1  Treadmill:  1  Indication for ETT: known ASHD  Contraindication to ETT: No   Stress Modality: exercise - treadmill  Cardiac Imaging Performed: non   Protocol: standard Bruce - maximal  Max BP:  174/72  Max MPHR (bpm):  168 85% MPR (bpm):  142  MPHR obtained (bpm):  146 % MPHR obtained:  88%  Reached 85% MPHR (min:sec):  9:39 Total Exercise Time (min-sec):  10:00  Workload in METS:  11.9 Borg Scale: 15  Reason ETT Terminated:  patient's desire to stop    ST Segment Analysis At Rest: normal ST segments - no evidence of significant ST depression With Exercise: no evidence of significant ST depression  Other Information Arrhythmia:  Rare PVC during exercise Angina during ETT:  absent (0) Quality of ETT:  diagnostic  ETT Interpretation:  normal - no evidence of ischemia by ST analysis  Comments: Good exercise tolerance. No chest pain. Normal BP response to exercise. No ST-T changes to suggest ischemia.   Recommendations: Follow up with Dr. Arvilla Meres as directed. Tereso Newcomer, PA-C  9:21 AM 09/09/2011

## 2011-09-21 ENCOUNTER — Other Ambulatory Visit: Payer: Self-pay | Admitting: *Deleted

## 2011-09-21 MED ORDER — OMEGA-3-ACID ETHYL ESTERS 1 G PO CAPS: 2.0000 g | ORAL_CAPSULE | Freq: Two times a day (BID) | ORAL | Status: DC

## 2011-09-21 MED ORDER — NIACIN ER (ANTIHYPERLIPIDEMIC) 1000 MG PO TBCR: 1000.0000 mg | EXTENDED_RELEASE_TABLET | Freq: Every day | ORAL | Status: DC

## 2011-09-21 MED ORDER — ROSUVASTATIN CALCIUM 10 MG PO TABS: 10.0000 mg | ORAL_TABLET | Freq: Every day | ORAL | Status: DC

## 2011-09-21 NOTE — Telephone Encounter (Signed)
Refilled Lovaza, Niaspan and Crestor.

## 2011-10-05 ENCOUNTER — Other Ambulatory Visit: Payer: Self-pay | Admitting: Internal Medicine

## 2011-10-05 ENCOUNTER — Telehealth: Payer: Self-pay | Admitting: Internal Medicine

## 2011-10-05 MED ORDER — ROSUVASTATIN CALCIUM 10 MG PO TABS: 10.0000 mg | ORAL_TABLET | Freq: Every day | ORAL | Status: DC

## 2011-10-05 MED ORDER — NIACIN ER (ANTIHYPERLIPIDEMIC) 1000 MG PO TBCR: 1000.0000 mg | EXTENDED_RELEASE_TABLET | Freq: Every day | ORAL | Status: DC

## 2011-10-05 MED ORDER — OMEGA-3-ACID ETHYL ESTERS 1 G PO CAPS: 2.0000 g | ORAL_CAPSULE | Freq: Two times a day (BID) | ORAL | Status: DC

## 2011-10-05 NOTE — Telephone Encounter (Signed)
Out of pills 

## 2011-10-05 NOTE — Telephone Encounter (Signed)
Close  

## 2011-10-16 ENCOUNTER — Telehealth: Payer: Self-pay | Admitting: Internal Medicine

## 2011-10-16 NOTE — Telephone Encounter (Signed)
FYI:  Called patient to resched appnt per Bensimhon sched.  Patient would like to be seen in [facility] and is upset that he was not xfered with the rest of the Bensimhon patients.  Please return call to patient at [phone]

## 2011-10-16 NOTE — Telephone Encounter (Signed)
Will forward to Deliah Goody and Dr. Gala Romney to address.

## 2011-10-19 ENCOUNTER — Telehealth (HOSPITAL_COMMUNITY): Payer: Self-pay | Admitting: Internal Medicine

## 2011-10-19 NOTE — Telephone Encounter (Signed)
Please call pt,  Pt not sure is he will be coming to to CHF or to his [facility] office. He wants to know. If you will call him. He is very confused at which location he will be going to. He said he had spoken with you before concerning this appt. Thanks

## 2011-10-21 ENCOUNTER — Ambulatory Visit: Payer: 59 | Admitting: Internal Medicine

## 2011-10-21 NOTE — Telephone Encounter (Signed)
Spoke with pt, per heather and dr bensimhon the pt can see dan at the [facility]. Pt states he has the number and will call to get rescheduled.

## 2011-10-22 NOTE — Telephone Encounter (Signed)
Jacob Gibbs spoke w/pt he will call us to schedule an appt

## 2012-01-07 ENCOUNTER — Encounter: Payer: Self-pay | Admitting: Cardiovascular Disease

## 2012-01-07 ENCOUNTER — Ambulatory Visit (INDEPENDENT_AMBULATORY_CARE_PROVIDER_SITE_OTHER): Payer: 59 | Admitting: Cardiovascular Disease

## 2012-01-07 VITALS — BP 110/76 | HR 65 | Ht 69.0 in | Wt 218.1 lb

## 2012-01-07 DIAGNOSIS — I2510 Atherosclerotic heart disease of native coronary artery without angina pectoris: Secondary | ICD-10-CM

## 2012-01-07 NOTE — Patient Instructions (Addendum)
Your physician wants you to follow-up in:  12 months.  You will receive a reminder letter in the mail two months in advance. If you don't receive a letter, please call our office to schedule the follow-up appointment.   

## 2012-01-07 NOTE — Progress Notes (Signed)
History of Present Illness: 51 yo male with history of paroxysmal atrial fibrillation, HLD and CAD s/p NSTEMI with 3-v CABG in 2009 with Dr. Cornelius Moras (RIMA - LAD, LIMA - OM3, SVG- RCA) who is here today for cardiac follow up. He had been previously followed by Dr. Elease Hashimoto and was seen once in 2012 by Dr. Gala Romney. He is here today to establish with me. Prior to NSTEMI in 2009 did not have many symptoms. He only had mild tightness in back of throat. Cath with 3-v CAD. Did well post CABG and has not had any problems since. He had been on Toprol in the past but not over last few years.   He is here today for follow up. He has had no chest pain, SOB, palpitations. He remains very active. Plays doubles tennis several days a week. Also uses elliptical and rowing machine and walks around [facility].   Primary Care Physician: Eric Form  Last Lipid Profile:Lipid Panel     Component Value Date/Time   CHOL 114 08/28/2010 0922   TRIG 124.0 08/28/2010 0922   HDL 45.10 08/28/2010 0922   CHOLHDL 3 08/28/2010 0922   VLDL 24.8 08/28/2010 0922   LDLCALC 44 08/28/2010 1610     Past Medical History  Diagnosis Date  . Coronary artery disease     EF 55%; MULTIVESSEL s/p CABG 2009  . Hyperlipidemia   . History of tobacco use     REMOTE  . Thrombocytopenia   . Atrial fibrillation     2005  . Syncope and collapse   . DDD (degenerative disc disease), cervical     Past Surgical History  Procedure Date  . Coronary artery bypass graft 02/06/08    X 3    Current Outpatient Prescriptions  Medication Sig Dispense Refill  . aspirin 325 MG EC tablet Take 81 mg by mouth daily.       . niacin (NIASPAN) 1000 MG CR tablet Take 1 tablet (1,000 mg total) by mouth at bedtime.  90 tablet  3  . NON FORMULARY ELEMENTAL L ARGININE daily       . omega-3 acid ethyl esters (LOVAZA) 1 G capsule Take 1 g by mouth 2 (two) times daily.      . rosuvastatin (CRESTOR) 10 MG tablet Take 1 tablet (10 mg total) by mouth daily.  90  tablet  3  . Testosterone (ANDROGEL PUMP) 1.25 GM/ACT (1%) GEL Place onto the skin. As directed        . DISCONTD: omega-3 acid ethyl esters (LOVAZA) 1 G capsule Take 2 capsules (2 g total) by mouth 2 (two) times daily.  360 capsule  3    Allergies  Allergen Reactions  . Contrast Media (Iodinated Diagnostic Agents)     History   Social History  . Marital Status: Married    Spouse Name: N/A    Number of Children: N/A  . Years of Education: N/A   Occupational History  . Financial advisor    Social History Main Topics  . Smoking status: Former Smoker -- 0.5 packs/day for 20 years    Types: Cigarettes    Quit date: 01/07/2004  . Smokeless tobacco: Not on file   Comment: Stopped 71yrs ago  . Alcohol Use: 1.5 oz/week    3 drink(s) per week     OCCASIONAL  . Drug Use: No  . Sexually Active: Not on file   Other Topics Concern  . Not on file   Social History Narrative  MARRIEDTOBACCO USE ON AND OFFETOH OCCASIONALLYUSES HERBAL SUPP. ALONG WITH A DAILY VITAMIN PACKHAD STRESS TEST APPROX 5 YRSNEW ONSET AFIBSYNCOPE    Family History  Problem Relation Age of Onset  . Coronary artery disease Mother     ALSO ONE OF HIS UNCLES    Review of Systems:  As stated in the HPI and otherwise negative.   BP 110/76  Pulse 65  Ht 5\' 9"  (1.753 m)  Wt 218 lb 1.9 oz (98.939 kg)  BMI 32.21 kg/m2  Physical Examination: General: Well developed, well nourished, NAD HEENT: OP clear, mucus membranes moist SKIN: warm, dry. No rashes. Neuro: No focal deficits Musculoskeletal: Muscle strength 5/5 all ext Psychiatric: Mood and affect normal Neck: No JVD, no carotid bruits, no thyromegaly, no lymphadenopathy. Lungs:Clear bilaterally, no wheezes, rhonci, crackles Cardiovascular: Regular rate and rhythm. No murmurs, gallops or rubs. Abdomen:Soft. Bowel sounds present. Non-tender.  Extremities: No lower extremity edema. Pulses are 2 + in the bilateral DP/PT.  EKG: NSR, rate 65 bpm.    Assessment and Plan:   1. CAD: s/p CABG. He is having no dyspnea or Chest pain. He is very active. Lipids at goal. BP is well controlled. No changes in therapy.

## 2012-06-29 ENCOUNTER — Encounter: Payer: Self-pay | Admitting: Cardiovascular Disease

## 2012-11-15 ENCOUNTER — Other Ambulatory Visit: Payer: Self-pay

## 2012-11-15 MED ORDER — ROSUVASTATIN CALCIUM 10 MG PO TABS: 10.0000 mg | ORAL_TABLET | Freq: Every day | ORAL | Status: DC

## 2012-11-15 MED ORDER — NIACIN ER (ANTIHYPERLIPIDEMIC) 1000 MG PO TBCR: 1000.0000 mg | EXTENDED_RELEASE_TABLET | Freq: Every day | ORAL | Status: DC

## 2013-01-03 ENCOUNTER — Other Ambulatory Visit: Payer: Self-pay

## 2013-01-03 MED ORDER — OMEGA-3-ACID ETHYL ESTERS 1 G PO CAPS: 1.0000 g | ORAL_CAPSULE | Freq: Two times a day (BID) | ORAL | Status: DC

## 2013-01-24 ENCOUNTER — Encounter: Payer: Self-pay | Admitting: Cardiovascular Disease

## 2013-01-24 ENCOUNTER — Ambulatory Visit (INDEPENDENT_AMBULATORY_CARE_PROVIDER_SITE_OTHER): Payer: 59 | Admitting: Cardiovascular Disease

## 2013-01-24 VITALS — BP 122/88 | HR 68 | Ht 69.0 in | Wt 221.0 lb

## 2013-01-24 DIAGNOSIS — I2510 Atherosclerotic heart disease of native coronary artery without angina pectoris: Secondary | ICD-10-CM

## 2013-01-24 NOTE — Progress Notes (Signed)
History of Present Illness: 52 yo male with history of paroxysmal atrial fibrillation, HLD and CAD s/p NSTEMI with 3-V CABG in 2009 with Dr. Cornelius Moras (RIMA - LAD, LIMA - OM3, SVG- RCA) who is here today for cardiac follow up. He had been previously followed by Dr. Elease Hashimoto and was seen once in 2012 by Dr. Gala Romney. Prior to NSTEMI in 2009 did not have many symptoms. He only had mild tightness in back of throat. Cath with 3-v CAD followed by 3V CABG.   He is here today for follow up. He has had no chest pain, SOB, palpitations. He remains very active. Plays doubles tennis several days a week. Also uses elliptical and rowing machine and walks around [facility].   Primary Care Physician: Eric Form  Last Lipid Profile: Followed in primary care.    Past Medical History  Diagnosis Date  . Coronary artery disease     EF 55%; MULTIVESSEL s/p CABG 2009  . Hyperlipidemia   . History of tobacco use     REMOTE  . Thrombocytopenia   . Atrial fibrillation     2005  . Syncope and collapse   . DDD (degenerative disc disease), cervical     Past Surgical History  Procedure Laterality Date  . Coronary artery bypass graft  02/06/08    X 3    Current Outpatient Prescriptions  Medication Sig Dispense Refill  . aspirin 325 MG EC tablet Take 81 mg by mouth daily.       . niacin (NIASPAN) 1000 MG CR tablet Take 1 tablet (1,000 mg total) by mouth at bedtime.  90 tablet  1  . NON FORMULARY ELEMENTAL L ARGININE daily       . omega-3 acid ethyl esters (LOVAZA) 1 G capsule Take 1 capsule (1 g total) by mouth 2 (two) times daily.  180 capsule  0  . rosuvastatin (CRESTOR) 10 MG tablet Take 1 tablet (10 mg total) by mouth daily.  90 tablet  1  . Testosterone (ANDROGEL PUMP) 1.25 GM/ACT (1%) GEL Place onto the skin. As directed         No current facility-administered medications for this visit.    Allergies  Allergen Reactions  . Contrast Media [Iodinated Diagnostic Agents]   . Iodine Solution  [Povidone Iodine]     History   Social History  . Marital Status: Married    Spouse Name: N/A    Number of Children: N/A  . Years of Education: N/A   Occupational History  . Financial advisor    Social History Main Topics  . Smoking status: Former Smoker -- 0.50 packs/day for 20 years    Types: Cigarettes    Quit date: 01/07/2004  . Smokeless tobacco: Not on file     Comment: Stopped 37yrs ago  . Alcohol Use: 1.5 oz/week    3 drink(s) per week     Comment: OCCASIONAL  . Drug Use: No  . Sexual Activity: Not on file   Other Topics Concern  . Not on file   Social History Narrative   MARRIED   TOBACCO USE ON AND OFF   ETOH OCCASIONALLY   USES HERBAL SUPP. ALONG WITH A DAILY VITAMIN PACK   HAD STRESS TEST APPROX 5 YRS   NEW ONSET AFIB   SYNCOPE    Family History  Problem Relation Age of Onset  . Coronary artery disease Mother     ALSO ONE OF HIS UNCLES    Review of  Systems:  As stated in the HPI and otherwise negative.   BP 122/88  Pulse 68  Ht 5\' 9"  (1.753 m)  Wt 221 lb (100.245 kg)  BMI 32.62 kg/m2  Physical Examination: General: Well developed, well nourished, NAD HEENT: OP clear, mucus membranes moist SKIN: warm, dry. No rashes. Neuro: No focal deficits Musculoskeletal: Muscle strength 5/5 all ext Psychiatric: Mood and affect normal Neck: No JVD, no carotid bruits, no thyromegaly, no lymphadenopathy. Lungs:Clear bilaterally, no wheezes, rhonci, crackles Cardiovascular: Regular rate and rhythm. No murmurs, gallops or rubs. Abdomen:Soft. Bowel sounds present. Non-tender.  Extremities: No lower extremity edema. Pulses are 2 + in the bilateral DP/PT.  EKG: NSR, rate 68 bpm.   Assessment and Plan:   1. CAD: s/p CABG. He is having no dyspnea or chest pain. He is very active. Stress test 2013 without ischemic EKG changes. Lipids at goal in primary care. BP is well controlled. No changes in therapy. Continue ASA and statin. He is not on a beta blocker  secondary to bradycardia.

## 2013-01-24 NOTE — Patient Instructions (Signed)
Your physician wants you to follow-up in:  12 months.  You will receive a reminder letter in the mail two months in advance. If you don't receive a letter, please call our office to schedule the follow-up appointment.   

## 2013-02-22 ENCOUNTER — Other Ambulatory Visit: Payer: Self-pay

## 2013-02-22 MED ORDER — ROSUVASTATIN CALCIUM 10 MG PO TABS: 10.0000 mg | ORAL_TABLET | Freq: Every day | ORAL | Status: DC

## 2013-02-22 MED ORDER — OMEGA-3-ACID ETHYL ESTERS 1 G PO CAPS: 1.0000 g | ORAL_CAPSULE | Freq: Two times a day (BID) | ORAL | Status: DC

## 2013-02-22 MED ORDER — NIACIN ER (ANTIHYPERLIPIDEMIC) 1000 MG PO TBCR: 1000.0000 mg | EXTENDED_RELEASE_TABLET | Freq: Every day | ORAL | Status: DC

## 2013-07-07 ENCOUNTER — Encounter: Payer: Self-pay | Admitting: Cardiovascular Disease

## 2013-08-17 ENCOUNTER — Other Ambulatory Visit: Payer: Self-pay | Admitting: Cardiovascular Disease

## 2013-09-11 ENCOUNTER — Ambulatory Visit (INDEPENDENT_AMBULATORY_CARE_PROVIDER_SITE_OTHER): Payer: 59 | Admitting: Podiatry

## 2013-09-11 ENCOUNTER — Encounter: Payer: Self-pay | Admitting: Podiatry

## 2013-09-11 ENCOUNTER — Ambulatory Visit (INDEPENDENT_AMBULATORY_CARE_PROVIDER_SITE_OTHER): Payer: 59

## 2013-09-11 VITALS — BP 115/68 | HR 77 | Resp 18

## 2013-09-11 DIAGNOSIS — M779 Enthesopathy, unspecified: Secondary | ICD-10-CM

## 2013-09-11 DIAGNOSIS — M2040 Other hammer toe(s) (acquired), unspecified foot: Secondary | ICD-10-CM

## 2013-09-11 MED ORDER — TRIAMCINOLONE ACETONIDE 10 MG/ML IJ SUSP
10.0000 mg | Freq: Once | INTRAMUSCULAR | Status: AC
  Administered 2013-09-11: 10 mg

## 2013-09-11 MED ORDER — DICLOFENAC SODIUM 75 MG PO TBEC: 75.0000 mg | DELAYED_RELEASE_TABLET | Freq: Two times a day (BID) | ORAL | Status: DC

## 2013-09-11 NOTE — Progress Notes (Signed)
Subjective:     Patient ID: Jacob Gibbs, male   DOB: 1960-04-09, 53 y.o.   MRN: [id number]  HPI patient presents stating he's had discomfort in his right second metatarsophalangeal joint for almost 6 months and has made activity levels difficult. Did go to an orthopedic doctor who gave him a graphite insoles   Review of Systems  All other systems reviewed and are negative.      Objective:   Physical Exam  Nursing note and vitals reviewed. Constitutional: He is oriented to person, place, and time.  Cardiovascular: Intact distal pulses.   Musculoskeletal: Normal range of motion.  Neurological: He is oriented to person, place, and time.  Skin: Skin is warm.   neurovascular status intact with inflammation and pain around the second metatarsophalangeal joint right with mild lifting of the toe noted. Range of motion subtalar midtarsal joint adequate and muscle strength within normal limits and digits are found to be well perfused with a mild depression of the arch upon weightbearing    Assessment:     Probable flexor plate stretch or possible tear secondary to excessive activity and January creating chronic inflammation of the second MPJ joint    Plan:     H&P and x-rays reviewed. Today I did a proximal nerve block aspirated the second MPJ was able to take out a small amount of clear fluid and then injected with half cc of dexamethasone Kenalog and applied thick plantar pad. Reappoint one week for consideration of orthotics or other treatment depending on the response we get also placed on short-term diclofenac 75 mg twice a day

## 2013-09-11 NOTE — Progress Notes (Signed)
° °  Subjective:    Patient ID: Jacob SidleGary L Gibbs, male    DOB: Nov 20, 1960, 53 y.o.   MRN: [id number]  HPI 2nd toe on my right foot and has been hurting since January of this year and burns and throbs and feels like a petal under it    Review of Systems  All other systems reviewed and are negative.      Objective:   Physical Exam        Assessment & Plan:

## 2013-09-21 ENCOUNTER — Encounter: Payer: Self-pay | Admitting: Podiatry

## 2013-09-21 ENCOUNTER — Ambulatory Visit (INDEPENDENT_AMBULATORY_CARE_PROVIDER_SITE_OTHER): Payer: 59 | Admitting: Podiatry

## 2013-09-21 VITALS — BP 126/80 | HR 73 | Resp 16

## 2013-09-21 DIAGNOSIS — M2040 Other hammer toe(s) (acquired), unspecified foot: Secondary | ICD-10-CM

## 2013-09-21 DIAGNOSIS — M779 Enthesopathy, unspecified: Secondary | ICD-10-CM

## 2013-09-21 NOTE — Progress Notes (Signed)
Subjective:     Patient ID: Jacob SidleGary L Gibbs, male   DOB: Nov 18, 1960, 53 y.o.   MRN: [id number]  HPI patient right foot is doing a lot better with pain still noted if a lot of activity is occurring   Review of Systems     Objective:   Physical Exam Neurovascular status is found to be intact with muscle strength adequate and significant diminishment of discomfort second MPJ right with moderate lifting of the toe noted    Assessment:     Explained flexor plate stretch or dislocation and that ultimately this toe may need to be fused but at this time her to continue use pads consider orthotics if symptoms warrant and someday this may require surgery.     Plan:     Condition explained the patient and discussed possible surgery in the future if symptoms persist and the possibility for orthotics. Dispensed pads we're rigid bottom shoes and we'll see how he progresses

## 2013-10-16 ENCOUNTER — Other Ambulatory Visit: Payer: Self-pay | Admitting: Cardiovascular Disease

## 2013-11-01 ENCOUNTER — Telehealth: Payer: Self-pay | Admitting: Cardiovascular Disease

## 2013-11-01 NOTE — Telephone Encounter (Signed)
Pt.notified

## 2013-11-01 NOTE — Telephone Encounter (Signed)
Should be ok. Thayer Ohmhris

## 2013-11-01 NOTE — Telephone Encounter (Signed)
Reviewed with PharmD in our office and no contraindications for pt being in study. Our office is participating in this study. Will forward to Dr. Clifton JamesMcAlhany to see if OK for pt to participate.

## 2013-11-01 NOTE — Telephone Encounter (Signed)
New message     Pt want to participate in a bococizumab study (for cholesterol).  He was recommended by his PCP. However, he want to make sure it is ok with his cardiologist.  Please call at your convenience

## 2014-01-31 ENCOUNTER — Ambulatory Visit (INDEPENDENT_AMBULATORY_CARE_PROVIDER_SITE_OTHER): Payer: 59 | Admitting: Cardiovascular Disease

## 2014-01-31 ENCOUNTER — Encounter: Payer: Self-pay | Admitting: Cardiovascular Disease

## 2014-01-31 VITALS — BP 110/74 | HR 70 | Ht 69.0 in | Wt 219.0 lb

## 2014-01-31 DIAGNOSIS — I2510 Atherosclerotic heart disease of native coronary artery without angina pectoris: Secondary | ICD-10-CM

## 2014-01-31 DIAGNOSIS — E785 Hyperlipidemia, unspecified: Secondary | ICD-10-CM

## 2014-01-31 DIAGNOSIS — I480 Paroxysmal atrial fibrillation: Secondary | ICD-10-CM

## 2014-01-31 NOTE — Progress Notes (Signed)
History of Present Illness: 53 yo male with history of paroxysmal atrial fibrillation, HLD and CAD s/p NSTEMI with 3-V CABG in 2009 with Dr. Cornelius Moraswen (RIMA - LAD, LIMA - OM3, SVG- RCA) who is here today for cardiac follow up. He had been previously followed by Dr. Elease HashimotoNahser and was seen once in 2012 by Dr. Gala RomneyBensimhon.    He is here today for follow up. He has had no chest pain, SOB, palpitations. He remains very active. Plays doubles tennis several days a week. Also uses elliptical and rowing machine and [place] [facility] [place].   Primary Care Physician: Eric Formoug Shaw  Last Lipid Profile: Followed in primary care. April 2015 TC 135 LDL 80. See scanned documents   Past Medical History  Diagnosis Date  . Coronary artery disease     EF 55%; MULTIVESSEL s/p CABG 2009  . Hyperlipidemia   . History of tobacco use     REMOTE  . Thrombocytopenia   . Atrial fibrillation     2005  . Syncope and collapse   . DDD (degenerative disc disease), cervical     Past Surgical History  Procedure Laterality Date  . Coronary artery bypass graft  02/06/08    X 3    Current Outpatient Prescriptions  Medication Sig Dispense Refill  . aspirin 81 MG tablet Take 81 mg by mouth daily.    . CRESTOR 10 MG tablet Take 1 tablet by mouth  daily 90 tablet 1  . niacin (NIASPAN) 1000 MG CR tablet Take 1 tablet (1,000 mg total) by mouth at bedtime. 90 tablet 1  . NON FORMULARY ELEMENTAL L ARGININE daily     . omega-3 acid ethyl esters (LOVAZA) 1 G capsule Take 1 capsule (1 g total) by mouth 2 (two) times daily. 180 capsule 3  . Testosterone (ANDROGEL PUMP) 1.25 GM/ACT (1%) GEL Place onto the skin. As directed       No current facility-administered medications for this visit.    Allergies  Allergen Reactions  . Contrast Media [Iodinated Diagnostic Agents]   . Iodine Solution [Povidone Iodine]     History   Social History  . Marital Status: Married    Spouse Name: N/A    Number of Children: N/A  . Years  of Education: N/A   Occupational History  . Financial advisor    Social History Main Topics  . Smoking status: Former Smoker -- 0.50 packs/day for 20 years    Types: Cigarettes    Quit date: 01/07/2004  . Smokeless tobacco: Not on file     Comment: Stopped 2361yrs ago  . Alcohol Use: 1.5 oz/week    3 drink(s) per week     Comment: OCCASIONAL  . Drug Use: No  . Sexual Activity: Not on file   Other Topics Concern  . Not on file   Social History Narrative   MARRIED   TOBACCO USE ON AND OFF   ETOH OCCASIONALLY   USES HERBAL SUPP. ALONG WITH A DAILY VITAMIN PACK   HAD STRESS TEST APPROX 5 YRS   NEW ONSET AFIB   SYNCOPE    Family History  Problem Relation Age of Onset  . Coronary artery disease Mother     ALSO ONE OF HIS UNCLES    Review of Systems:  As stated in the HPI and otherwise negative.   BP 110/74 mmHg  Pulse 70  Ht 5\' 9"  (1.753 m)  Wt 219 lb (99.338 kg)  BMI 32.33 kg/m2  Physical Examination: General: Well developed, well nourished, NAD HEENT: OP clear, mucus membranes moist SKIN: warm, dry. No rashes. Neuro: No focal deficits Musculoskeletal: Muscle strength 5/5 all ext Psychiatric: Mood and affect normal Neck: No JVD, no carotid bruits, no thyromegaly, no lymphadenopathy. Lungs:Clear bilaterally, no wheezes, rhonci, crackles Cardiovascular: Regular rate and rhythm. No murmurs, gallops or rubs. Abdomen:Soft. Bowel sounds present. Non-tender.  Extremities: No lower extremity edema. Pulses are 2 + in the bilateral DP/PT.  EKG: NSR, rate 70 bpm.   Assessment and Plan:   1. CAD: s/p CABG. He is having no dyspnea or chest pain. He is very active. Stress test 2013 without ischemic EKG changes. Lipids at goal in primary care. BP is well controlled. No changes in therapy. Continue ASA and statin. He is not on a beta blocker secondary to bradycardia. Will arrange echo to assess LVEF. Will arrange exercise treadmill stress test to exclude ischemia since no stress  testing since June 2013.   2. Paroxysmal atrial fibrillation: Sinus today. His episode of atrial fibrillation occurred in 2005. No indication for anti-coagulation.   3. HLD: He is on a statin. Last LDL 80 in April 2015. I have reviewed his lipids and LFTs from April 2015, scanned in. He is in a study through primary are looking at lipids.

## 2014-01-31 NOTE — Patient Instructions (Signed)
Your physician wants you to follow-up in:  12 months. You will receive a reminder letter in the mail two months in advance. If you don't receive a letter, please call our office to schedule the follow-up appointment.  Your physician has requested that you have an echocardiogram. Echocardiography is a painless test that uses sound waves to create images of your heart. It provides your doctor with information about the size and shape of your heart and how well your heart's chambers and valves are working. This procedure takes approximately one hour. There are no restrictions for this procedure.   Your physician has requested that you have an exercise tolerance test. Can be done with NP or PA or at [facility] office.  For further information please visit [email or web address]. Please also follow instruction sheet, as given.

## 2014-02-15 ENCOUNTER — Telehealth (HOSPITAL_COMMUNITY): Payer: Self-pay

## 2014-02-15 NOTE — Telephone Encounter (Signed)
Encounter complete. 

## 2014-02-16 ENCOUNTER — Telehealth (HOSPITAL_COMMUNITY): Payer: Self-pay | Admitting: *Deleted

## 2014-02-20 ENCOUNTER — Ambulatory Visit (HOSPITAL_COMMUNITY)
Admission: RE | Admit: 2014-02-20 | Discharge: 2014-02-20 | Disposition: A | Discharge status: Home / Self Care | Payer: 59 | Source: Ambulatory Visit | Attending: Cardiovascular Disease | Admitting: Cardiovascular Disease

## 2014-02-20 ENCOUNTER — Telehealth: Payer: Self-pay | Admitting: Cardiovascular Disease

## 2014-02-20 ENCOUNTER — Ambulatory Visit (HOSPITAL_COMMUNITY): Payer: 59

## 2014-02-20 DIAGNOSIS — I2510 Atherosclerotic heart disease of native coronary artery without angina pectoris: Secondary | ICD-10-CM | POA: Insufficient documentation

## 2014-02-20 NOTE — Telephone Encounter (Signed)
Spoke with pt and reviewed stress test results with him.  

## 2014-02-20 NOTE — Procedures (Signed)
Exercise Treadmill Test  Pre-Exercise Testing Evaluation NSR  Test  Exercise Tolerance Test Ordering MD: Melene Mullerhristopher McAlhaney, MD    Unique Test No: 1  Treadmill:  1  Indication for ETT: known ASHD  Contraindication to ETT: No   Stress Modality: exercise - treadmill  Cardiac Imaging Performed: non   Protocol: standard Bruce - maximal  Max BP:  190/90  Max MPHR (bpm):  166 85% MPR (bpm):  141  MPHR obtained (bpm):  146 % MPHR obtained:  87  Reached 85% MPHR (min:sec):  9:25 Total Exercise Time (min-sec):  10  Workload in METS:  11.9 Borg Scale: 15  Reason ETT Terminated:  SOB and leg fatigue    ST Segment Analysis At Rest: normal ST segments - no evidence of significant ST depression With Exercise: no evidence of significant ST depression  Other Information Arrhythmia:  No Angina during ETT:  absent (0) Quality of ETT:  diagnostic  ETT Interpretation:  normal - no evidence of ischemia by ST analysis  Comments: Normotensive response to exercise Good exercise tolerance  Thurmon FairMihai Jamayia Croker, MD, [facility] [phone] office [phone] pager

## 2014-02-20 NOTE — Telephone Encounter (Signed)
New Msg   Patient is returning a call. Please contact at [phone]

## 2014-03-12 ENCOUNTER — Ambulatory Visit (HOSPITAL_COMMUNITY)
Admission: RE | Admit: 2014-03-12 | Discharge: 2014-03-12 | Disposition: A | Discharge status: Home / Self Care | Payer: 59 | Source: Ambulatory Visit | Attending: Cardiology | Admitting: Cardiology

## 2014-03-12 DIAGNOSIS — I480 Paroxysmal atrial fibrillation: Secondary | ICD-10-CM

## 2014-03-12 DIAGNOSIS — I4891 Unspecified atrial fibrillation: Secondary | ICD-10-CM | POA: Insufficient documentation

## 2014-03-12 DIAGNOSIS — R55 Syncope and collapse: Secondary | ICD-10-CM | POA: Diagnosis not present

## 2014-03-12 DIAGNOSIS — I2510 Atherosclerotic heart disease of native coronary artery without angina pectoris: Secondary | ICD-10-CM

## 2014-03-12 DIAGNOSIS — Z8249 Family history of ischemic heart disease and other diseases of the circulatory system: Secondary | ICD-10-CM | POA: Insufficient documentation

## 2014-03-12 DIAGNOSIS — I252 Old myocardial infarction: Secondary | ICD-10-CM | POA: Diagnosis not present

## 2014-03-12 DIAGNOSIS — E785 Hyperlipidemia, unspecified: Secondary | ICD-10-CM | POA: Diagnosis not present

## 2014-03-12 DIAGNOSIS — Z87891 Personal history of nicotine dependence: Secondary | ICD-10-CM | POA: Insufficient documentation

## 2014-03-12 DIAGNOSIS — I517 Cardiomegaly: Secondary | ICD-10-CM

## 2014-03-12 NOTE — Progress Notes (Signed)
2D Echocardiogram Complete.  03/12/2014   Jerry Haugen, RDCS

## 2014-06-19 ENCOUNTER — Other Ambulatory Visit: Payer: Self-pay | Admitting: Cardiovascular Disease

## 2014-07-27 ENCOUNTER — Encounter: Payer: Self-pay | Admitting: Cardiovascular Disease

## 2014-10-08 ENCOUNTER — Other Ambulatory Visit: Payer: Self-pay | Admitting: Cardiovascular Disease

## 2014-12-06 ENCOUNTER — Other Ambulatory Visit: Payer: Self-pay | Admitting: Cardiovascular Disease

## 2015-01-11 ENCOUNTER — Other Ambulatory Visit: Payer: Self-pay | Admitting: Cardiovascular Disease

## 2015-02-26 ENCOUNTER — Encounter: Payer: Self-pay | Admitting: Cardiovascular Disease

## 2015-02-26 ENCOUNTER — Ambulatory Visit (INDEPENDENT_AMBULATORY_CARE_PROVIDER_SITE_OTHER): Payer: 59 | Admitting: Cardiovascular Disease

## 2015-02-26 VITALS — BP 122/80 | HR 73 | Ht 69.0 in | Wt 231.0 lb

## 2015-02-26 DIAGNOSIS — I480 Paroxysmal atrial fibrillation: Secondary | ICD-10-CM | POA: Diagnosis not present

## 2015-02-26 DIAGNOSIS — E785 Hyperlipidemia, unspecified: Secondary | ICD-10-CM

## 2015-02-26 DIAGNOSIS — I2510 Atherosclerotic heart disease of native coronary artery without angina pectoris: Secondary | ICD-10-CM | POA: Diagnosis not present

## 2015-02-26 MED ORDER — ROSUVASTATIN CALCIUM 10 MG PO TABS
10.0000 mg | ORAL_TABLET | Freq: Every day | ORAL | Status: DC
Start: 2015-02-26 — End: 2016-01-27

## 2015-02-26 NOTE — Progress Notes (Signed)
Chief Complaint  Patient presents with  . Annual Exam     History of Present Illness: 54 yo male with history of paroxysmal atrial fibrillation, HLD and CAD s/p NSTEMI with 3-V CABG in 2009 with Dr. Cornelius Moraswen (RIMA - LAD, LIMA - OM3, SVG- RCA) who is here today for cardiac follow up. Exercise stress test November 2015 with no ischemia. Echo December 2015 with normal LV size and function, mild valve disease.   He is here today for follow up. He has had no chest pain, SOB, palpitations. He remains very active. He plays tennis several days per week. He also uses an elliptical and rowing machine and walks around [facility] [place].   Primary Care Physician: Eric Formoug Shaw  Last Lipid Profile: Followed in primary care. See scanned documents   Past Medical History  Diagnosis Date  . Coronary artery disease     EF 55%; MULTIVESSEL s/p CABG 2009  . Hyperlipidemia   . History of tobacco use     REMOTE  . Thrombocytopenia (HCC)   . Atrial fibrillation (HCC)     2005  . Syncope and collapse   . DDD (degenerative disc disease), cervical     Past Surgical History  Procedure Laterality Date  . Coronary artery bypass graft  02/06/08    X 3    Current Outpatient Prescriptions  Medication Sig Dispense Refill  . aspirin 81 MG tablet Take 81 mg by mouth daily.    . NON FORMULARY ELEMENTAL L ARGININE daily     . omega-3 acid ethyl esters (LOVAZA) 1 G capsule Take 1 capsule by mouth  twice daily 180 capsule 2  . rosuvastatin (CRESTOR) 10 MG tablet Take 1 tablet (10 mg total) by mouth daily. 90 tablet 3  . Testosterone (ANDROGEL PUMP) 1.25 GM/ACT (1%) GEL Place onto the skin. As directed       No current facility-administered medications for this visit.    Allergies  Allergen Reactions  . Contrast Media [Iodinated Diagnostic Agents]   . Iodine Solution [Povidone Iodine]     Social History   Social History  . Marital Status: Married    Spouse Name: N/A  . Number of Children: N/A  . Years of  Education: N/A   Occupational History  . Financial advisor    Social History Main Topics  . Smoking status: Former Smoker -- 0.50 packs/day for 20 years    Types: Cigarettes    Quit date: 01/07/2004  . Smokeless tobacco: Not on file     Comment: Stopped 7539yrs ago  . Alcohol Use: 1.5 oz/week    3 drink(s) per week     Comment: OCCASIONAL  . Drug Use: No  . Sexual Activity: Not on file   Other Topics Concern  . Not on file   Social History Narrative   MARRIED   TOBACCO USE ON AND OFF   ETOH OCCASIONALLY   USES HERBAL SUPP. ALONG WITH A DAILY VITAMIN PACK   HAD STRESS TEST APPROX 5 YRS   NEW ONSET AFIB   SYNCOPE    Family History  Problem Relation Age of Onset  . Coronary artery disease Mother     ALSO ONE OF HIS UNCLES    Review of Systems:  As stated in the HPI and otherwise negative.   BP 122/80 mmHg  Pulse 73  Ht 5\' 9"  (1.753 m)  Wt 231 lb (104.781 kg)  BMI 34.10 kg/m2  Physical Examination: General: Well developed, well nourished, NAD HEENT:  OP clear, mucus membranes moist SKIN: warm, dry. No rashes. Neuro: No focal deficits Musculoskeletal: Muscle strength 5/5 all ext Psychiatric: Mood and affect normal Neck: No JVD, no carotid bruits, no thyromegaly, no lymphadenopathy. Lungs:Clear bilaterally, no wheezes, rhonci, crackles Cardiovascular: Regular rate and rhythm. No murmurs, gallops or rubs. Abdomen:Soft. Bowel sounds present. Non-tender.  Extremities: No lower extremity edema. Pulses are 2 + in the bilateral DP/PT.  Echo 03/12/14: Left ventricle: The cavity size was normal. Wall thickness was increased in a pattern of mild LVH. Systolic function was normal. The estimated ejection fraction was in the range of 55% to 60%. Although no diagnostic regional wall motion abnormality was identified, this possibility cannot be completely excluded on the basis of this study. Diastolic function was not fully assessed. - Aortic valve: There was no  stenosis. - Aorta: Mildly dilated aortic root. Aortic root dimension: 37 mm (ED). - Mitral valve: Mildly calcified annulus. Mildly calcified leaflets . There was trivial regurgitation. - Tricuspid valve: Peak RV-RA gradient (S): 24 mm Hg. - Pulmonary arteries: PA peak pressure: 27 mm Hg (S). - Inferior vena cava: The vessel was normal in size. The respirophasic diameter changes were in the normal range (= 50%), consistent with normal central venous pressure.  Impressions:  - Normal LV size with mild LV hypertrophy. EF 55-60%. Normal RV size and systolic function. No significant valvular abnormalities.   EKG:  EKG is ordered today. The ekg ordered today demonstrates NSR, rate 73 bpm. Incomplete RBBB. T wave flattening V1, V2.   Recent Labs: No results found for requested labs within last 365 days.   Lipid Panel    Component Value Date/Time   CHOL 114 08/28/2010 0922   TRIG 124.0 08/28/2010 0922   HDL 45.10 08/28/2010 0922   CHOLHDL 3 08/28/2010 0922   VLDL 24.8 08/28/2010 0922   LDLCALC 44 08/28/2010 0922     Wt Readings from Last 3 Encounters:  02/26/15 231 lb (104.781 kg)  01/31/14 219 lb (99.338 kg)  01/24/13 221 lb (100.245 kg)     Other studies Reviewed: Additional studies/ records that were reviewed today include: . Review of the above records demonstrates:    Assessment and Plan:   1. CAD: s/p CABG. He is having no dyspnea or chest pain. He is very active. Stress test 2015 without ischemic EKG changes. Continue ASA and statin. He is not on a beta blocker secondary to bradycardia.   2. Paroxysmal atrial fibrillation: Sinus today. His episode of atrial fibrillation occurred in 2005. No recurrence. No indication for anti-coagulation.   3. HLD: He is on a statin. Lipids from primary care are scanned in.   Current medicines are reviewed at length with the patient today.  The patient does not have concerns regarding medicines.  The following changes  have been made:  no change  Labs/ tests ordered today include:   Orders Placed This Encounter  Procedures  . EKG 12-Lead     Disposition:   FU with me in 12  months   Signed, Verne Carrow, MD 02/26/2015 4:39 PM    [facility] [address] [place], [place], [place]  [address] Phone: [phone]; Fax: [phone]

## 2015-02-26 NOTE — Patient Instructions (Signed)
Medication Instructions:  Your physician recommends that you continue on your current medications as directed. Please refer to the Current Medication list given to you today.  Labwork: None  Testing/Procedures: None  Follow-Up: Your physician wants you to follow-up in: 1 year with Dr. McAlhany.  You will receive a reminder letter in the mail two months in advance. If you don't receive a letter, please call our office to schedule the follow-up appointment.   Any Other Special Instructions Will Be Listed Below (If Applicable).     If you need a refill on your cardiac medications before your next appointment, please call your pharmacy.   

## 2015-08-29 ENCOUNTER — Telehealth: Payer: Self-pay | Admitting: Cardiovascular Disease

## 2015-08-29 NOTE — Telephone Encounter (Signed)
Spoke with pt. He reports he is feeling well. No chest pain.  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 Also involves a 200 yard swimming test.

## 2015-08-29 NOTE — Telephone Encounter (Signed)
New message     Pt want to know----based on his medical records, would Dr Clifton JamesMcAlhany have any objections to pt getting his certification to scuba dive?  Please call

## 2015-08-30 NOTE — Telephone Encounter (Signed)
Pt notified of information from Dr. McAlhany 

## 2015-08-30 NOTE — Telephone Encounter (Signed)
We typically do not endorse SCUBA diving in patients with CAD, especially post CABG. I am not sure a Cabin crewCUBA instructor would endorse this either. I would say no. Thayer Ohmhris

## 2015-09-03 ENCOUNTER — Other Ambulatory Visit: Payer: Self-pay | Admitting: Cardiovascular Disease

## 2016-01-27 ENCOUNTER — Other Ambulatory Visit: Payer: Self-pay | Admitting: Cardiovascular Disease

## 2016-01-29 ENCOUNTER — Encounter: Payer: Self-pay | Admitting: Cardiovascular Disease

## 2016-02-16 ENCOUNTER — Other Ambulatory Visit: Payer: Self-pay | Admitting: Cardiovascular Disease

## 2016-02-24 ENCOUNTER — Ambulatory Visit (HOSPITAL_COMMUNITY): Admit: 2016-02-24 | Payer: 59 | Admitting: Cardiovascular Disease

## 2016-02-24 ENCOUNTER — Inpatient Hospital Stay (HOSPITAL_COMMUNITY)
Admission: EM | Admit: 2016-02-24 | Discharge: 2016-02-25 | DRG: 247 | Disposition: A | Discharge status: Home / Self Care | Payer: 59 | Attending: Cardiovascular Disease | Admitting: Cardiovascular Disease

## 2016-02-24 ENCOUNTER — Encounter (HOSPITAL_COMMUNITY)
Admission: EM | Disposition: A | Discharge status: Home / Self Care | Payer: Self-pay | Source: Home / Self Care | Attending: Cardiovascular Disease

## 2016-02-24 ENCOUNTER — Encounter (HOSPITAL_COMMUNITY): Payer: Self-pay | Admitting: Cardiovascular Disease

## 2016-02-24 DIAGNOSIS — Z8249 Family history of ischemic heart disease and other diseases of the circulatory system: Secondary | ICD-10-CM

## 2016-02-24 DIAGNOSIS — I2119 ST elevation (STEMI) myocardial infarction involving other coronary artery of inferior wall: Principal | ICD-10-CM | POA: Diagnosis present

## 2016-02-24 DIAGNOSIS — E7800 Pure hypercholesterolemia, unspecified: Secondary | ICD-10-CM | POA: Diagnosis not present

## 2016-02-24 DIAGNOSIS — E785 Hyperlipidemia, unspecified: Secondary | ICD-10-CM | POA: Diagnosis present

## 2016-02-24 DIAGNOSIS — I2510 Atherosclerotic heart disease of native coronary artery without angina pectoris: Secondary | ICD-10-CM | POA: Diagnosis present

## 2016-02-24 DIAGNOSIS — I2111 ST elevation (STEMI) myocardial infarction involving right coronary artery: Secondary | ICD-10-CM

## 2016-02-24 DIAGNOSIS — Z87891 Personal history of nicotine dependence: Secondary | ICD-10-CM | POA: Diagnosis not present

## 2016-02-24 DIAGNOSIS — Z955 Presence of coronary angioplasty implant and graft: Secondary | ICD-10-CM

## 2016-02-24 DIAGNOSIS — I480 Paroxysmal atrial fibrillation: Secondary | ICD-10-CM | POA: Diagnosis present

## 2016-02-24 DIAGNOSIS — I252 Old myocardial infarction: Secondary | ICD-10-CM

## 2016-02-24 DIAGNOSIS — Z91041 Radiographic dye allergy status: Secondary | ICD-10-CM | POA: Diagnosis not present

## 2016-02-24 DIAGNOSIS — Z951 Presence of aortocoronary bypass graft: Secondary | ICD-10-CM | POA: Diagnosis not present

## 2016-02-24 DIAGNOSIS — I25810 Atherosclerosis of coronary artery bypass graft(s) without angina pectoris: Secondary | ICD-10-CM | POA: Diagnosis present

## 2016-02-24 DIAGNOSIS — Z7982 Long term (current) use of aspirin: Secondary | ICD-10-CM | POA: Diagnosis not present

## 2016-02-24 DIAGNOSIS — R079 Chest pain, unspecified: Secondary | ICD-10-CM | POA: Diagnosis present

## 2016-02-24 HISTORY — PX: CARDIAC CATHETERIZATION: SHX172

## 2016-02-24 HISTORY — DX: Paroxysmal atrial fibrillation: I48.0

## 2016-02-24 LAB — TROPONIN I: Troponin I: <0.03 ng/mL (ref <0.03)

## 2016-02-24 LAB — COMPREHENSIVE METABOLIC PANEL
ALBUMIN: 4 g/dL (ref 3.5–5.0)
ALT: 26 U/L (ref 17–63)
ANION GAP: 9 (ref 5–15)
AST: 22 U/L (ref 15–41)
Alkaline Phosphatase: 72 U/L (ref 38–126)
BUN: 14 mg/dL (ref 6–20)
CHLORIDE: 107 mmol/L (ref 101–111)
CO2: 22 mmol/L (ref 22–32)
Calcium: 9.1 mg/dL (ref 8.9–10.3)
Creatinine, Ser: 1.04 mg/dL (ref 0.61–1.24)
GFR calc Af Amer: >60 mL/min (ref >60)
GFR calc non Af Amer: >60 mL/min (ref >60)
GLUCOSE: 96 mg/dL (ref 65–99)
POTASSIUM: 4.4 mmol/L (ref 3.5–5.1)
SODIUM: 138 mmol/L (ref 135–145)
Total Bilirubin: 0.7 mg/dL (ref 0.3–1.2)
Total Protein: 6.4 g/dL — ABNORMAL LOW (ref 6.5–8.1)

## 2016-02-24 LAB — APTT: aPTT: 33 seconds (ref 24–36)

## 2016-02-24 LAB — LIPID PANEL
Cholesterol: 138 mg/dL (ref 0–200)
HDL: 34 mg/dL — AB (ref >40)
LDL Cholesterol: 76 mg/dL (ref 0–99)
Total CHOL/HDL Ratio: 4.1 RATIO
Triglycerides: 141 mg/dL (ref <150)
VLDL: 28 mg/dL (ref 0–40)

## 2016-02-24 LAB — CREATININE, SERUM
CREATININE: 1.14 mg/dL (ref 0.61–1.24)
GFR calc Af Amer: >60 mL/min (ref >60)
GFR calc non Af Amer: >60 mL/min (ref >60)

## 2016-02-24 LAB — CBC
HCT: 47.4 % (ref 39.0–52.0)
HEMATOCRIT: 49.6 % (ref 39.0–52.0)
HEMOGLOBIN: 16.9 g/dL (ref 13.0–17.0)
Hemoglobin: 16.1 g/dL (ref 13.0–17.0)
MCH: 29.7 pg (ref 26.0–34.0)
MCH: 29.5 pg (ref 26.0–34.0)
MCHC: 34.1 g/dL (ref 30.0–36.0)
MCHC: 34 g/dL (ref 30.0–36.0)
MCV: 87.2 fL (ref 78.0–100.0)
MCV: 86.8 fL (ref 78.0–100.0)
PLATELETS: 198 10*3/uL (ref 150–400)
Platelets: 228 10*3/uL (ref 150–400)
RBC: 5.69 MIL/uL (ref 4.22–5.81)
RBC: 5.46 MIL/uL (ref 4.22–5.81)
RDW: 14.3 % (ref 11.5–15.5)
RDW: 14.3 % (ref 11.5–15.5)
WBC: 9.3 10*3/uL (ref 4.0–10.5)
WBC: 14.1 10*3/uL — AB (ref 4.0–10.5)

## 2016-02-24 LAB — PROTIME-INR
INR: 0.98
PROTHROMBIN TIME: 13 s (ref 11.4–15.2)

## 2016-02-24 LAB — POCT I-STAT TROPONIN I: TROPONIN I, POC: 0 ng/mL (ref 0.00–0.08)

## 2016-02-24 LAB — MRSA PCR SCREENING: MRSA by PCR: NEGATIVE

## 2016-02-24 LAB — I-STAT CHEM 8, ED
BUN: 16 mg/dL (ref 6–20)
Calcium, Ion: 1.09 mmol/L — ABNORMAL LOW (ref 1.15–1.40)
Chloride: 103 mmol/L (ref 101–111)
Creatinine, Ser: 1 mg/dL (ref 0.61–1.24)
GLUCOSE: 96 mg/dL (ref 65–99)
HCT: 50 % (ref 39.0–52.0)
HEMOGLOBIN: 17 g/dL (ref 13.0–17.0)
POTASSIUM: 4.3 mmol/L (ref 3.5–5.1)
SODIUM: 141 mmol/L (ref 135–145)
TCO2: 24 mmol/L (ref 0–100)

## 2016-02-24 LAB — BRAIN NATRIURETIC PEPTIDE: B Natriuretic Peptide: 14.9 pg/mL (ref 0.0–100.0)

## 2016-02-24 SURGERY — CORONARY STENT INTERVENTION

## 2016-02-24 MED ORDER — SODIUM CHLORIDE 0.9 % WEIGHT BASED INFUSION: 1.0000 mL/kg/h | INTRAVENOUS | Status: DC

## 2016-02-24 MED ORDER — LIDOCAINE HCL (PF) 1 % IJ SOLN
INTRAMUSCULAR | Status: AC | PRN
  Administered 2016-02-24: 20 mL

## 2016-02-24 MED ORDER — BIVALIRUDIN BOLUS VIA INFUSION - CUPID
INTRAVENOUS | Status: AC | PRN
  Administered 2016-02-24: 78.75 mg via INTRAVENOUS

## 2016-02-24 MED ORDER — BIVALIRUDIN 250 MG IV SOLR
INTRAVENOUS | Status: AC
  Filled 2016-02-24: qty 250

## 2016-02-24 MED ORDER — TICAGRELOR 90 MG PO TABS
90.0000 mg | ORAL_TABLET | Freq: Two times a day (BID) | ORAL | Status: DC
  Administered 2016-02-25 (×2): 90 mg via ORAL
  Filled 2016-02-24 (×2): qty 1

## 2016-02-24 MED ORDER — METHYLPREDNISOLONE SODIUM SUCC 125 MG IJ SOLR
INTRAMUSCULAR | Status: AC
  Filled 2016-02-24: qty 2

## 2016-02-24 MED ORDER — TICAGRELOR 90 MG PO TABS
ORAL_TABLET | ORAL | Status: AC | PRN
  Administered 2016-02-24: 180 mg via ORAL

## 2016-02-24 MED ORDER — FENTANYL CITRATE (PF) 100 MCG/2ML IJ SOLN
INTRAMUSCULAR | Status: AC | PRN
  Administered 2016-02-24: 50 ug via INTRAVENOUS
  Administered 2016-02-24: 25 ug via INTRAVENOUS

## 2016-02-24 MED ORDER — OMEGA-3-ACID ETHYL ESTERS 1 G PO CAPS
1.0000 | ORAL_CAPSULE | Freq: Two times a day (BID) | ORAL | Status: DC
  Administered 2016-02-24 – 2016-02-25 (×2): 1 g via ORAL
  Filled 2016-02-24 (×2): qty 1

## 2016-02-24 MED ORDER — HEPARIN (PORCINE) IN NACL 2-0.9 UNIT/ML-% IJ SOLN
INTRAMUSCULAR | Status: AC | PRN
  Administered 2016-02-24: 1000 mL

## 2016-02-24 MED ORDER — FAMOTIDINE IN NACL 20-0.9 MG/50ML-% IV SOLN
INTRAVENOUS | Status: AC | PRN
  Administered 2016-02-24: 20 mg via INTRAVENOUS

## 2016-02-24 MED ORDER — MIDAZOLAM HCL 2 MG/2ML IJ SOLN
INTRAMUSCULAR | Status: AC
  Filled 2016-02-24: qty 2

## 2016-02-24 MED ORDER — ROSUVASTATIN CALCIUM 10 MG PO TABS
10.0000 mg | ORAL_TABLET | Freq: Every day | ORAL | Status: DC
  Administered 2016-02-24: 10 mg via ORAL
  Filled 2016-02-24: qty 1

## 2016-02-24 MED ORDER — MIDAZOLAM HCL 2 MG/2ML IJ SOLN
INTRAMUSCULAR | Status: AC | PRN
  Administered 2016-02-24: 1 mg via INTRAVENOUS
  Administered 2016-02-24: 2 mg via INTRAVENOUS

## 2016-02-24 MED ORDER — HEPARIN SODIUM (PORCINE) 1000 UNIT/ML IJ SOLN
INTRAMUSCULAR | Status: AC | PRN
Start: 2016-02-24 — End: ?
  Administered 2016-02-24: 4000 [IU] via INTRAVENOUS

## 2016-02-24 MED ORDER — LIDOCAINE HCL (PF) 1 % IJ SOLN
INTRAMUSCULAR | Status: AC
  Filled 2016-02-24: qty 30

## 2016-02-24 MED ORDER — LABETALOL HCL 5 MG/ML IV SOLN: 10.0000 mg | INTRAVENOUS | Status: AC | PRN

## 2016-02-24 MED ORDER — SODIUM CHLORIDE 0.9 % IV SOLN
INTRAVENOUS | Status: AC | PRN
  Administered 2016-02-24: 1.75 mg/kg/h via INTRAVENOUS

## 2016-02-24 MED ORDER — ACETAMINOPHEN 325 MG PO TABS: 650.0000 mg | ORAL_TABLET | ORAL | Status: DC | PRN

## 2016-02-24 MED ORDER — HEPARIN SODIUM (PORCINE) 5000 UNIT/ML IJ SOLN
5000.0000 [IU] | Freq: Three times a day (TID) | INTRAMUSCULAR | Status: DC
  Administered 2016-02-24: 5000 [IU] via SUBCUTANEOUS
  Filled 2016-02-24 (×2): qty 1

## 2016-02-24 MED ORDER — HEPARIN SODIUM (PORCINE) 1000 UNIT/ML IJ SOLN
INTRAMUSCULAR | Status: AC
  Filled 2016-02-24: qty 1

## 2016-02-24 MED ORDER — FENTANYL CITRATE (PF) 100 MCG/2ML IJ SOLN
INTRAMUSCULAR | Status: AC
  Filled 2016-02-24: qty 2

## 2016-02-24 MED ORDER — IOPAMIDOL (ISOVUE-370) INJECTION 76%
INTRAVENOUS | Status: AC
  Filled 2016-02-24: qty 100

## 2016-02-24 MED ORDER — ONDANSETRON HCL 4 MG/2ML IJ SOLN: 4.0000 mg | Freq: Four times a day (QID) | INTRAMUSCULAR | Status: DC | PRN

## 2016-02-24 MED ORDER — METHYLPREDNISOLONE SODIUM SUCC 125 MG IJ SOLR
INTRAMUSCULAR | Status: AC | PRN
Start: 2016-02-24 — End: ?
  Administered 2016-02-24: 125 mg via INTRAVENOUS

## 2016-02-24 MED ORDER — SODIUM CHLORIDE 0.9% FLUSH
3.0000 mL | Freq: Two times a day (BID) | INTRAVENOUS | Status: DC
  Administered 2016-02-25: 3 mL via INTRAVENOUS

## 2016-02-24 MED ORDER — TICAGRELOR 90 MG PO TABS
ORAL_TABLET | ORAL | Status: AC
  Filled 2016-02-24: qty 2

## 2016-02-24 MED ORDER — SODIUM CHLORIDE 0.9% FLUSH: 3.0000 mL | INTRAVENOUS | Status: DC | PRN

## 2016-02-24 MED ORDER — ASPIRIN 81 MG PO CHEW
81.0000 mg | CHEWABLE_TABLET | Freq: Every day | ORAL | Status: DC
  Administered 2016-02-24 – 2016-02-25 (×2): 81 mg via ORAL
  Filled 2016-02-24 (×2): qty 1

## 2016-02-24 MED ORDER — METOPROLOL TARTRATE 12.5 MG HALF TABLET
12.5000 mg | ORAL_TABLET | Freq: Two times a day (BID) | ORAL | Status: DC
  Administered 2016-02-24 – 2016-02-25 (×2): 12.5 mg via ORAL
  Filled 2016-02-24 (×2): qty 1

## 2016-02-24 MED ORDER — HEPARIN (PORCINE) IN NACL 2-0.9 UNIT/ML-% IJ SOLN
INTRAMUSCULAR | Status: AC
  Filled 2016-02-24: qty 1000

## 2016-02-24 MED ORDER — DIPHENHYDRAMINE HCL 50 MG/ML IJ SOLN
INTRAMUSCULAR | Status: AC | PRN
  Administered 2016-02-24: 50 mg via INTRAVENOUS

## 2016-02-24 MED ORDER — IOPAMIDOL (ISOVUE-370) INJECTION 76%
INTRAVENOUS | Status: AC
  Filled 2016-02-24: qty 125

## 2016-02-24 MED ORDER — NITROGLYCERIN 0.4 MG SL SUBL: 0.4000 mg | SUBLINGUAL_TABLET | SUBLINGUAL | Status: DC | PRN

## 2016-02-24 MED ORDER — IOPAMIDOL (ISOVUE-370) INJECTION 76%
INTRAVENOUS | Status: AC | PRN
  Administered 2016-02-24: 190 mL via INTRA_ARTERIAL

## 2016-02-24 MED ORDER — HYDRALAZINE HCL 20 MG/ML IJ SOLN: 5.0000 mg | INTRAMUSCULAR | Status: AC | PRN

## 2016-02-24 MED ORDER — SODIUM CHLORIDE 0.9 % WEIGHT BASED INFUSION: 1.0000 mL/kg/h | INTRAVENOUS | Status: AC

## 2016-02-24 MED ORDER — SODIUM CHLORIDE 0.9 % IV SOLN: 250.0000 mL | INTRAVENOUS | Status: DC | PRN

## 2016-02-24 MED ORDER — FAMOTIDINE IN NACL 20-0.9 MG/50ML-% IV SOLN
INTRAVENOUS | Status: AC
  Filled 2016-02-24: qty 50

## 2016-02-24 MED ORDER — DIPHENHYDRAMINE HCL 50 MG/ML IJ SOLN
INTRAMUSCULAR | Status: AC
  Filled 2016-02-24: qty 1

## 2016-02-24 SURGICAL SUPPLY — 23 items
BALLN MOZEC 2.0X12 (BALLOONS) ×3
BALLN ~~LOC~~ EMERGE MR 2.5X12 (BALLOONS) ×3
BALLN ~~LOC~~ EMERGE MR 2.75X12 (BALLOONS) ×3
BALLOON MOZEC 2.0X12 (BALLOONS) IMPLANT
BALLOON ~~LOC~~ EMERGE MR 2.5X12 (BALLOONS) IMPLANT
BALLOON ~~LOC~~ EMERGE MR 2.75X12 (BALLOONS) IMPLANT
CATH INFINITI 5FR ANG PIGTAIL (CATHETERS) ×2 IMPLANT
CATH INFINITI 5FR JL4 (CATHETERS) ×2 IMPLANT
CATH INFINITI JR4 5F (CATHETERS) ×2 IMPLANT
DEVICE CLOSURE PERCLS PRGLD 6F (VASCULAR PRODUCTS) IMPLANT
ELECT DEFIB PAD ADLT CADENCE (PAD) ×2 IMPLANT
GUIDE CATH RUNWAY 6FR FR4 (CATHETERS) ×2 IMPLANT
KIT ENCORE 26 ADVANTAGE (KITS) ×2 IMPLANT
KIT HEART LEFT (KITS) ×3 IMPLANT
PACK CARDIAC CATHETERIZATION (CUSTOM PROCEDURE TRAY) ×3 IMPLANT
PERCLOSE PROGLIDE 6F (VASCULAR PRODUCTS) ×3
SHEATH PINNACLE 6F 10CM (SHEATH) ×2 IMPLANT
STENT PROMUS PREM MR 2.5X20 (Permanent Stent) ×2 IMPLANT
SYR MEDRAD MARK V 150ML (SYRINGE) ×3 IMPLANT
TRANSDUCER W/STOPCOCK (MISCELLANEOUS) ×3 IMPLANT
TUBING CIL FLEX 10 FLL-RA (TUBING) ×3 IMPLANT
WIRE COUGAR XT STRL 190CM (WIRE) ×2 IMPLANT
WIRE EMERALD 3MM-J .035X150CM (WIRE) ×2 IMPLANT

## 2016-02-24 NOTE — Progress Notes (Signed)
eLink Physician-Brief Progress Note Patient Name: Jonelle SidleGary L Mcfetridge DOB: 12/17/1960 MRN: [id number]   Date of Service  02/24/2016  HPI/Events of Note  Admitted with STEMI.  Hemodynamics stable, labs reviewed.  On ASA, brilinta, lopressor, crestor.  eICU Interventions  Nothing.     Intervention Category Major Interventions: Other:  Maudry Zeidan 02/24/2016, 5:49 PM

## 2016-02-24 NOTE — ED Provider Notes (Signed)
55 year old gentleman came to us via EMS, had ST elevations in leads 2, 3 and aVF, with left shoulder pain that radiated to his neck. No shortness of breath no diaphoresis no nausea vomiting. He has had an MI in the past. At that time he had reflux type pain. His vital signs are stable he is afebrile. On initial assessment is that his pain is minimal, currently a 2 out of 10. His heart sounds are normal lungs are clear to auscultation. He is mentating normally. Is visualized with no reciprocal change, however he has not had a significant provocative testing a few years and cardiology down at patient's bedside has decided to offer intervention with catheterization. Patient and cardiology agreed to this plan and patient is taken to cardiology Cath Lab suite. For the remainder this patient's care please see cardiology team notes and Cath Lab intervention notes. Of note he was given aspirin nitroglycerin for pain and pain was resolving after that.   Cherlynn PerchesEric Iran Kievit, MD 02/24/16 1445    Canary Brimhristopher J Tegeler, MD 02/24/16 [id number]

## 2016-02-24 NOTE — H&P (Signed)
History and Physical  Patient ID: Jacob Gibbs MRN: [id number], SOB: 07/29/1960 55 y.o. Date of Encounter: 02/24/2016, 3:28 PM  Primary Physician: Martha ClanShaw, William, MD Primary Cardiologist: Earney HamburgMcAlhany, Chris, MD  Chief Complaint: Chest pain  HPI: 55 y.o. male w/ PMHx significant for CAD s/p CABG 2009 who presented to [facility] on 02/24/2016 with complaints of chest and shoulder pain.    The patient was evaluated by Dr. Clelia CroftShaw today after calling in with left shoulder pain. He describes pain in the left chest, left shoulder, and left upper back intermittently over about 48 hours. Pains and then sharp and at times pleuritic. There has been some pressure-like sensation as well. He denies associated shortness of breath, diaphoresis, nausea, or vomiting. At the time of my interview he complains of 2/10 pain.  The patient has a history of multivessel CAD. He underwent CABG in 2009 with bilateral mammary artery grafts and the saphenous vein graft to the RCA. He has had normal LV function in the past. He has been followed by Dr. Clifton JamesMcAlhany with stress testing in 2015 suggesting no ischemic changes.  During his evaluation today he was found to have inferior ST segment elevation and EMS was called. A code STEMI was paged. He presents directly to the cardiac catheterization lab.  Past Medical History:  Diagnosis Date  . Acute ST elevation myocardial infarction (STEMI) of inferior wall (HCC) 02/24/2016  . Atrial fibrillation (HCC)    2005  . Coronary artery disease    EF 55%; MULTIVESSEL s/p CABG 2009  . DDD (degenerative disc disease), cervical   . History of tobacco use    REMOTE  . Hyperlipidemia   . Syncope and collapse   . Thrombocytopenia [facility](HCC)      Surgical History:  Past Surgical History:  Procedure Laterality Date  . CORONARY ARTERY BYPASS GRAFT  02/06/08   X 3     Home Meds: Prior to Admission medications   Medication Sig Start Date End Date Taking? Authorizing  Provider  aspirin 81 MG tablet Take 81 mg by mouth daily.    Historical Provider, MD  NON FORMULARY ELEMENTAL L ARGININE daily     Historical Provider, MD  omega-3 acid ethyl esters (LOVAZA) 1 g capsule Take 1 capsule (1 g total) by mouth 2 (two) times daily. *Please call and schedule a one year follow up appointment* 02/17/16   Kathleene Hazelhristopher D McAlhany, MD  rosuvastatin (CRESTOR) 10 MG tablet Take 1 tablet (10 mg total) by mouth daily. Please call and schedule a one year follow up appointment 01/28/16   Kathleene Hazelhristopher D McAlhany, MD  Testosterone (ANDROGEL PUMP) 1.25 GM/ACT (1%) GEL Place onto the skin. As directed      Historical Provider, MD    Allergies:  Allergies  Allergen Reactions  . Contrast Media [Iodinated Diagnostic Agents]   . Iodine Solution [Povidone Iodine]     Social History   Social History  . Marital status: Married    Spouse name: N/A  . Number of children: N/A  . Years of education: N/A   Occupational History  . Financial advisor    Social History Main Topics  . Smoking status: Former Smoker    Packs/day: 0.50    Years: 20.00    Types: Cigarettes    Quit date: 01/07/2004  . Smokeless tobacco: Not on file     Comment: Stopped 3878yrs ago  . Alcohol use 1.5 oz/week    3 drink(s) per week  Comment: OCCASIONAL  . Drug use: No  . Sexual activity: Not on file   Other Topics Concern  . Not on file   Social History Narrative   MARRIED   TOBACCO USE ON AND OFF   ETOH OCCASIONALLY   USES HERBAL SUPP. ALONG WITH A DAILY VITAMIN PACK   HAD STRESS TEST APPROX 5 YRS   NEW ONSET AFIB   SYNCOPE     Family History  Problem Relation Age of Onset  . Coronary artery disease Mother     ALSO ONE OF HIS UNCLES    Review of Systems General: negative for chills, fever, night sweats or weight changes.  ENT: negative for rhinorrhea or epistaxis Cardiovascular: see HPI Dermatological: negative for rash Respiratory: negative for cough or wheezing GI: negative  for nausea, vomiting, diarrhea, bright red blood per rectum, melena, or hematemesis GU: no hematuria, urgency, or frequency Neurologic: negative for visual changes, syncope, headache, or dizziness Heme: no easy bruising or bleeding Endo: negative for excessive thirst, thyroid disorder, or flushing Musculoskeletal: negative for joint pain or swelling, negative for myalgias All other systems reviewed and are otherwise negative except as noted above.  Physical Exam: Blood pressure 119/88, pulse 75, resp. rate 19, SpO2 95 %. General: Well developed, well nourished, alert and oriented, in no acute distress. HEENT: Normocephalic, atraumatic, sclera anicteric Neck: Supple. Carotids 2+ without bruits. JVP normal Lungs: Clear bilaterally to auscultation without wheezes, rales, or rhonchi. Breathing is unlabored. Heart: RRR with normal S1 and S2. No murmurs, rubs, or gallops appreciated. Abdomen: Soft, non-tender, non-distended with normoactive bowel sounds. No hepatomegaly. No rebound/guarding. No obvious abdominal masses. Back: No CVA tenderness Msk:  Strength and tone appear normal for age. Extremities: No clubbing, cyanosis, or edema.  Distal pedal pulses are 2+ and equal bilaterally. Neuro: CNII-XII intact, moves all extremities spontaneously. Psych:  Responds to questions appropriately with a normal affect. Skin: warm and dry without rash   Labs:   Lab Results  Component Value Date   WBC 9.3 02/24/2016   HGB 17.0 02/24/2016   HCT 50.0 02/24/2016   MCV 87.2 02/24/2016   PLT 228 02/24/2016    Recent Labs Lab 02/24/16 1400 02/24/16 1403  NA 138 141  K 4.4 4.3  CL 107 103  CO2 22  --   BUN 14 16  CREATININE 1.04 1.00  CALCIUM 9.1  --   PROT 6.4*  --   BILITOT 0.7  --   ALKPHOS 72  --   ALT 26  --   AST 22  --   GLUCOSE 96 96    Recent Labs  02/24/16 1400  TROPONINI <0.03   Lab Results  Component Value Date   CHOL 138 02/24/2016   HDL 34 (L) 02/24/2016   LDLCALC 76  02/24/2016   TRIG 141 02/24/2016   No results found for: DDIMER  Radiology/Studies:  No results found.   EKG: Sinus rhythm with inferior STE, suspected acute inferior injury  CARDIAC STUDIES: pending  ASSESSMENT AND PLAN:  1. Inferior STEMI 2. CAD s/p CABG (2009) - LIMA-LCx, RIMA-LAD, SVG-distal RCA 3. Hyperlipidemia  The patient will be taken for emergency cardiac catheterization and primary PCI. Right femoral access will be used because of his bilateral mammary graft. Post MI medical therapy will be instituted. 4000 units of unfractionated heparin is administered prior to cardiac catheterization. The patient will be loaded with a thienopyridine drug. Further plans/disposition pending catheterization results.   Enzo BiSigned, Tennie Grussing MD 02/24/2016, 3:28 PM

## 2016-02-25 ENCOUNTER — Encounter (HOSPITAL_COMMUNITY): Payer: Self-pay | Admitting: Cardiovascular Disease

## 2016-02-25 DIAGNOSIS — I480 Paroxysmal atrial fibrillation: Secondary | ICD-10-CM | POA: Diagnosis present

## 2016-02-25 DIAGNOSIS — E7800 Pure hypercholesterolemia, unspecified: Secondary | ICD-10-CM

## 2016-02-25 DIAGNOSIS — I2510 Atherosclerotic heart disease of native coronary artery without angina pectoris: Secondary | ICD-10-CM | POA: Diagnosis present

## 2016-02-25 LAB — LIPID PANEL
CHOLESTEROL: 140 mg/dL (ref 0–200)
HDL: 34 mg/dL — ABNORMAL LOW (ref >40)
LDL Cholesterol: 93 mg/dL (ref 0–99)
TRIGLYCERIDES: 64 mg/dL (ref <150)
Total CHOL/HDL Ratio: 4.1 RATIO
VLDL: 13 mg/dL (ref 0–40)

## 2016-02-25 LAB — BASIC METABOLIC PANEL
Anion gap: 8 (ref 5–15)
BUN: 12 mg/dL (ref 6–20)
CHLORIDE: 107 mmol/L (ref 101–111)
CO2: 23 mmol/L (ref 22–32)
Calcium: 9.1 mg/dL (ref 8.9–10.3)
Creatinine, Ser: 0.93 mg/dL (ref 0.61–1.24)
GFR calc non Af Amer: >60 mL/min (ref >60)
Glucose, Bld: 146 mg/dL — ABNORMAL HIGH (ref 65–99)
POTASSIUM: 4.4 mmol/L (ref 3.5–5.1)
SODIUM: 138 mmol/L (ref 135–145)

## 2016-02-25 LAB — TROPONIN I: Troponin I: <0.03 ng/mL (ref <0.03)

## 2016-02-25 LAB — HEMOGLOBIN A1C
HEMOGLOBIN A1C: 5.6 % (ref 4.8–5.6)
Hgb A1c MFr Bld: 5.4 % (ref 4.8–5.6)
Mean Plasma Glucose: 114 mg/dL
Mean Plasma Glucose: 108 mg/dL

## 2016-02-25 LAB — CBC
HCT: 46.9 % (ref 39.0–52.0)
HEMOGLOBIN: 16.2 g/dL (ref 13.0–17.0)
MCH: 29.6 pg (ref 26.0–34.0)
MCHC: 34.5 g/dL (ref 30.0–36.0)
MCV: 85.6 fL (ref 78.0–100.0)
PLATELETS: 238 10*3/uL (ref 150–400)
RBC: 5.48 MIL/uL (ref 4.22–5.81)
RDW: 13.7 % (ref 11.5–15.5)
WBC: 18.6 10*3/uL — ABNORMAL HIGH (ref 4.0–10.5)

## 2016-02-25 LAB — POCT ACTIVATED CLOTTING TIME: Activated Clotting Time: 401 seconds

## 2016-02-25 MED ORDER — TICAGRELOR 90 MG PO TABS: 90.0000 mg | ORAL_TABLET | Freq: Two times a day (BID) | ORAL | 4 refills | Status: DC

## 2016-02-25 MED ORDER — ROSUVASTATIN CALCIUM 20 MG PO TABS: 20.0000 mg | ORAL_TABLET | Freq: Every day | ORAL | Status: DC

## 2016-02-25 MED ORDER — NITROGLYCERIN 0.4 MG SL SUBL: 0.4000 mg | SUBLINGUAL_TABLET | SUBLINGUAL | 0 refills | Status: DC | PRN

## 2016-02-25 MED ORDER — METOPROLOL TARTRATE 25 MG PO TABS: 12.5000 mg | ORAL_TABLET | Freq: Two times a day (BID) | ORAL | 3 refills | Status: DC

## 2016-02-25 MED ORDER — ROSUVASTATIN CALCIUM 20 MG PO TABS: 20.0000 mg | ORAL_TABLET | Freq: Every day | ORAL | 0 refills | Status: DC

## 2016-02-25 MED ORDER — METOPROLOL TARTRATE 25 MG PO TABS: 12.5000 mg | ORAL_TABLET | Freq: Two times a day (BID) | ORAL | 0 refills | Status: DC

## 2016-02-25 MED ORDER — NITROGLYCERIN 0.4 MG SL SUBL: 0.4000 mg | SUBLINGUAL_TABLET | SUBLINGUAL | 12 refills | Status: DC | PRN

## 2016-02-25 MED ORDER — ROSUVASTATIN CALCIUM 40 MG PO TABS: 40.0000 mg | ORAL_TABLET | Freq: Every day | ORAL | 0 refills | Status: DC

## 2016-02-25 MED ORDER — ROSUVASTATIN CALCIUM 40 MG PO TABS: 40.0000 mg | ORAL_TABLET | Freq: Every day | ORAL | 3 refills | Status: DC

## 2016-02-25 MED FILL — Diphenhydramine HCl Inj 50 MG/ML: INTRAMUSCULAR | Qty: 1 | Status: AC

## 2016-02-25 MED FILL — Famotidine in NaCl 0.9% IV Soln 20 MG/50ML: INTRAVENOUS | Qty: 50 | Status: AC

## 2016-02-25 MED FILL — Methylprednisolone Sod Succ For Inj 125 MG (Base Equiv): INTRAMUSCULAR | Qty: 2 | Status: AC

## 2016-02-25 NOTE — Progress Notes (Signed)
Patient discharge, all information has been given and questions answered. Cardiac rehab has been to see patient as well as CSW, brillinta card has been given. IV removed, no questions or concerns at this time. Patient to be taken to main entrance by hospital volunteer, wife is transporting home.

## 2016-02-25 NOTE — Progress Notes (Signed)
     SUBJECTIVE: NO chest pain or dyspnea.   Tele: sinus  BP (!) 117/101   Pulse 81   Temp 97.7 F (36.5 C) (Oral)   Resp 16   Wt 231 lb 0.7 oz (104.8 kg)   SpO2 90%   BMI 34.12 kg/m   Intake/Output Summary (Last 24 hours) at 02/25/16 0750 Last data filed at 02/24/16 2300  Gross per 24 hour  Intake            550.2 ml  Output                0 ml  Net            550.2 ml    PHYSICAL EXAM General: Well developed, well nourished, in no acute distress. Alert and oriented x 3.  Psych:  Good affect, responds appropriately Neck: No JVD. No masses noted.  Lungs: Clear bilaterally with no wheezes or rhonci noted.  Heart: RRR with no murmurs noted. Abdomen: Bowel sounds are present. Soft, non-tender.  Extremities: No lower extremity edema. Right groin without hematoma.   LABS: Basic Metabolic Panel:  Recent Labs  16/12/9609/27/17 1400 02/24/16 1403 02/24/16 1805 02/25/16 0515  NA 138 141  --  138  K 4.4 4.3  --  4.4  CL 107 103  --  107  CO2 22  --   --  23  GLUCOSE 96 96  --  146*  BUN 14 16  --  12  CREATININE 1.04 1.00 1.14 0.93  CALCIUM 9.1  --   --  9.1   CBC:  Recent Labs  02/24/16 1805 02/25/16 0515  WBC 14.1* 18.6*  HGB 16.1 16.2  HCT 47.4 46.9  MCV 86.8 85.6  PLT 198 238   Cardiac Enzymes:  Recent Labs  02/24/16 1805 02/24/16 2319 02/25/16 0515  TROPONINI <0.03 <0.03 <0.03   Fasting Lipid Panel:  Recent Labs  02/25/16 0515  CHOL 140  HDL 34*  LDLCALC 93  TRIG 64  CHOLHDL 4.1    Current Meds: . aspirin  81 mg Oral Daily  . heparin  5,000 Units Subcutaneous Q8H  . metoprolol tartrate  12.5 mg Oral BID  . omega-3 acid ethyl esters  1 capsule Oral BID  . rosuvastatin  10 mg Oral q1800  . sodium chloride flush  3 mL Intravenous Q12H  . ticagrelor  90 mg Oral BID     ASSESSMENT AND PLAN: 55 yo male with history of CAD with CABG in 2009 admitted with inferior STEMI on 02/24/16. The SVG to the RCA was severely diseased. The native RCA was  treated with a DES x 1.   1. CAD/Inferior STEMI: Admitted with inferior STEMI on 02/24/16. The SVG to the RCA was severely diseased. The native RCA was treated with a DES x 1. LVEF is overall preserved with slight wall motion abnormality. Troponin has remained negative. He is on ASA, Brilinta, beta blocker and statin. He is stable this am.   2. Hyperlipidemia: Continue statin.   D/C home today. Follow up in the office with office APP in 1-2 weeks.   Dajha Urquilla  11/28/20177:50 AM

## 2016-02-25 NOTE — Discharge Summary (Signed)
Discharge Summary    Patient ID: Jacob Gibbs,  MRN: [id number], DOB/AGE: October 15, 1960 55 y.o.  Admit date: 02/24/2016 Discharge date: 02/25/2016  Primary Care Provider: Martha ClanShaw, William Primary Cardiologist: Dr. Clifton JamesMcAlhany    Discharge Diagnoses    Principal Problem:   Acute ST elevation myocardial infarction (STEMI) of inferior wall (HCC) Active Problems:   HLD (hyperlipidemia)   Coronary artery disease   PAF (paroxysmal atrial fibrillation) (HCC)   Allergies Allergies  Allergen Reactions  . Contrast Media [Iodinated Diagnostic Agents] Other (See Comments)    Face turned scaly  . Iodine Solution [Povidone Iodine] Other (See Comments)    Face turned scaly     History of Present Illness     55 y.o. male w/ PMHx significant for CAD s/p CABG 2009, HLD and PAF (remotely in 2005) who presented to [facility] on 02/24/2016 with complaints of chest and shoulder pain.    He had an episode of atrial fibrillation occurred in 2005 with no recurrence. There has been no indication for anti-coagulation.   The patient has a history of multivessel CAD. He underwent CABG in 2009 with bilateral mammary artery grafts and the saphenous vein graft to the RCA. He has had normal LV function in the past. He has been followed by Dr. Clifton JamesMcAlhany with stress testing in 2015 suggesting no ischemic changes.  He was evaluated by his PCP, Dr. Clelia CroftShaw on the day of admission for left chest and shoulder pain. During his evaluation he was found to have inferior ST segment elevation and EMS was called. A code STEMI was paged and [facility] was [facility] directly to the cardiac catheterization lab.  Hospital Course     Consultants: none   1. CAD/Inferior STEMI: Admitted with inferior STEMI on 02/24/16. The SVG to the RCA was severely diseased. The native RCA was treated with a DES x 1. LVEF is overall preserved with slight wall motion abnormality. Troponin has remained negative. He is on ASA, Brilinta,  beta blocker and statin.   2. Hyperlipidemia: LDL 93. Goal <70. Will increase Crestor from 10mg  --> 20mg  daily.   3. PAF: remote episode. NSR this admission. No indication for anticoagulation  4. Leukocytosis: WBC up to 18.6. No fevers or complaints. Likely from stress of MI.   The patient has had an uncomplicated hospital course and is recovering well. The femoral catheter site is stable. He has been seen by Dr. Clifton JamesMcAlhany today and deemed ready for discharge home. All follow-up appointments have been scheduled. A written RX for a 30 day free supply of Brilinta was provided for the patient. Discharge medications are listed below.  _____________  Discharge Vitals Blood pressure 135/84, pulse 77, temperature 98.4 F (36.9 C), temperature source Oral, resp. rate 16, weight 231 lb 0.7 oz (104.8 kg), SpO2 93 %.  Filed Weights   02/24/16 1715  Weight: 231 lb 0.7 oz (104.8 kg)    Labs & Radiologic Studies     CBC  Recent Labs  02/24/16 1805 02/25/16 0515  WBC 14.1* 18.6*  HGB 16.1 16.2  HCT 47.4 46.9  MCV 86.8 85.6  PLT 198 238   Basic Metabolic Panel  Recent Labs  02/24/16 1400 02/24/16 1403 02/24/16 1805 02/25/16 0515  NA 138 141  --  138  K 4.4 4.3  --  4.4  CL 107 103  --  107  CO2 22  --   --  23  GLUCOSE 96 96  --  146*  BUN 14 16  --  12  CREATININE 1.04 1.00 1.14 0.93  CALCIUM 9.1  --   --  9.1   Liver Function Tests  Recent Labs  02/24/16 1400  AST 22  ALT 26  ALKPHOS 72  BILITOT 0.7  PROT 6.4*  ALBUMIN 4.0   No results for input(s): LIPASE, AMYLASE in the last 72 hours. Cardiac Enzymes  Recent Labs  02/24/16 1805 02/24/16 2319 02/25/16 0515  TROPONINI <0.03 <0.03 <0.03   BNP Invalid input(s): POCBNP D-Dimer No results for input(s): DDIMER in the last 72 hours. Hemoglobin A1C  Recent Labs  02/24/16 1805  HGBA1C 5.4   Fasting Lipid Panel  Recent Labs  02/25/16 0515  CHOL 140  HDL 34*  LDLCALC 93  TRIG 64  CHOLHDL 4.1    Thyroid Function Tests No results for input(s): TSH, T4TOTAL, T3FREE, THYROIDAB in the last 72 hours.  Invalid input(s): FREET3  No results found.   Diagnostic Studies/Procedures    02/24/16 Procedures  Coronary Stent Intervention  Left Heart Cath and Cors/Grafts Angiography  Conclusion   1. Severe three-vessel coronary artery disease with severe proximal LAD stenosis, total occlusion of the third OM branch of the circumflex, and severe native RCA stenosis 2. Status post CABG with continued patency of the LIMA to OM 3, RIMA to LAD, and SVG to distal RCA 3. Severe diffuse vein graft disease of the SVG to distal RCA 4. Successful native vessel PCI of the right coronary artery using a single drug-eluting stent 5. Mild segmental contraction abnormality the left ventricle with preserved overall LVEF  Recommendations: Aggressive post MI medical therapy, M.D. aPTT with aspirin and brilinta for at least 12 months.    _____________    Disposition   Pt is being discharged home today in good condition.  Follow-up Plans & Appointments    Follow-up Information    Cline Crock, PA-C Follow up on 03/03/2016.   Specialties:  Cardiology, Radiology Why:  @ 11am (please arrive 10 minutes early) Contact information: [address] [place] [address] [phone]          Discharge Instructions    Amb Referral to Cardiac Rehabilitation    Complete by:  As directed    Diagnosis:   Coronary Stents PTCA        Discharge Medications     Medication List    STOP taking these medications   ibuprofen 200 MG tablet Commonly known as:  ADVIL,MOTRIN     TAKE these medications   aspirin EC 81 MG tablet Take 81 mg by mouth at bedtime.   L-Arginine Powd Take 1 scoop by mouth daily. Mix in water and drink   metoprolol tartrate 25 MG tablet Commonly known as:  LOPRESSOR Take 0.5 tablets (12.5 mg total) by mouth 2 (two) times daily.   nitroGLYCERIN  0.4 MG SL tablet Commonly known as:  NITROSTAT Place 1 tablet (0.4 mg total) under the tongue every 5 (five) minutes x 3 doses as needed for chest pain.   omega-3 acid ethyl esters 1 g capsule Commonly known as:  LOVAZA Take 1 capsule (1 g total) by mouth 2 (two) times daily. *Please call and schedule a one year follow up appointment*   rosuvastatin 20 MG tablet Commonly known as:  CRESTOR Take 1 tablet (20 mg total) by mouth daily. Please call and schedule a one year follow up appointment What changed:  medication strength  how much to take   testosterone cypionate  200 MG/ML injection Commonly known as:  DEPOTESTOSTERONE CYPIONATE Inject 100 mg into the muscle every Tuesday.   ticagrelor 90 MG Tabs tablet Commonly known as:  BRILINTA Take 1 tablet (90 mg total) by mouth 2 (two) times daily.        Outstanding Labs/Studies   none  Duration of Discharge Encounter   Greater than 30 minutes including physician time.  Signed, Cline CrockKathryn Ainara Eldridge PA-C 02/25/2016, 1:00 PM

## 2016-02-25 NOTE — Progress Notes (Signed)
CARDIAC REHAB PHASE I   PRE:  Rate/Rhythm: 80 SR    BP: sitting 122/77    SaO2:   MODE:  Ambulation: 470 ft   POST:  Rate/Rhythm: 90 SR    BP: sitting 126/70     SaO2:   Pt's groin very sore getting up and down, moving slow for him around the hall. No other c/o. Ed completed with pt and wife. Pt curious when he can get back to tennis and the elliptical. Encouraged f/u first. Will refer to [facility]. Pt is waiting on Brilinta booklet, understands its importance. He watches his diet. Diet sheet given. [id number]   Harriet MassonRandi Kristan Tamina Cyphers CES, ACSM 02/25/2016 12:43 PM

## 2016-02-25 NOTE — Discharge Instructions (Signed)

## 2016-02-25 NOTE — Progress Notes (Signed)
Pt for disch. Being dc home on brilinta. Gave pt 30day free and copay card for brilinta. Pt has [facility] ins. indep w adl's. No other dc needs.

## 2016-02-26 ENCOUNTER — Telehealth: Payer: Self-pay | Admitting: Cardiovascular Disease

## 2016-02-26 NOTE — Telephone Encounter (Signed)
Calling stating he had a stent placed on Mon and is concerned because yesterday he felt good but today feels tired, some SOB and HR 63. States he does not have a BP cuff but will go purchase one this afternoon.  He was started on Metoprolol and Brilinta. Advised that Metoprolol will cause him to be tired and have the low HR and Brilinta may cause some SOB and he may experience these symptoms until his body adjusts to medications. Suggested that he get a BP cuff and monitor his BP. Also he should monitor his HR and if his SOB  becomes more pronounced to call the office back.  States he has had some very minor chest discomfort but overall feels better. States he will call us back this afternoon if he has more questions regarding his BP and HR.

## 2016-02-26 NOTE — Telephone Encounter (Signed)
New message     Pt c/o Shortness Of Breath: STAT if SOB developed within the last 24 hours or pt is noticeably SOB on the phone  1. Are you currently SOB (can you hear that pt is SOB on the phone)?  no  2. How long have you been experiencing SOB?  Since this am 3. Are you SOB when sitting or when up moving around?  sitting  4. Are you currently experiencing any other symptoms?  Headache Stent put in on Monday.  Pt states his watch says his HR is 63 but he is usually in the 70's

## 2016-03-01 NOTE — Progress Notes (Signed)
Cardiology Office Note    Date:  03/03/2016   ID:  JHEREMY BOGER, DOB 03/23/1961, MRN [id number]  PCP:  Martha Clan, MD  Cardiologist:  Dr. Clifton James   CC: post hospital follow up- STEMI  History of Present Illness:  Jacob Gibbs is a 55 y.o. male with a history of CAD s/p CABG 2009 and STEMI s/p DES to RCA (01/2016), HLDand PAF (remotely in 2005) who presents to clinic for post hospital follow up.   He had an episode of atrial fibrillation occurred in 2005 with no recurrence. There has been no indication for anti-coagulation.   The patient has a history of multivessel CAD. He underwent CABG in 2009 with bilateral mammary artery grafts and the saphenous vein graft to the RCA. He has had normal LV function in the past. He has been followed by Dr. Clifton James with stress testing in 2015 suggesting no ischemic changes.  He was recently admitted 11/27-11/28/17. He presented to his PCP office with chest pain. A code STEMI was paged and he was brought to [facility] directly to the cardiac catheterization lab. The SVG to the RCA was severely diseased. The native RCA was treated with a DES x 1. LVEF was overall preserved with slight wall motion abnormality. Troponin remained negative. He was discharged the following day on DAPT with ASA and Brilinta. Crestor was increased from 10mg  to 20mg  daily.   Today he presents to clinic for follow up. Yesterday he had a little bit of indigestion and did better on Mylanta. He has been walking since discharge. He has been walking 1-2x day for 15 minutes and no chest pain or SOB. No LE edema, orthopnea or PND. No dizziness or syncope. No palpitations.    Past Medical History:  Diagnosis Date  . Coronary artery disease    a. 2009: mutivessel CAD s/p CABG   b. 01/2016: inf STEMI s/p DES to RCA  . DDD (degenerative disc disease), cervical   . History of tobacco use    REMOTE  . Hyperlipidemia   . PAF (paroxysmal atrial fibrillation) (HCC)    a. brief  episode in 2005 wtih no recurrence. No indication for OAC  . Syncope and collapse     Past Surgical History:  Procedure Laterality Date  . CARDIAC CATHETERIZATION N/A 02/24/2016   Procedure: Coronary Stent Intervention;  Surgeon: Tonny Bollman, MD;  Location: [facility] INVASIVE CV LAB;  Service: Cardiovascular;  Laterality: N/A;  . CARDIAC CATHETERIZATION N/A 02/24/2016   Procedure: Left Heart Cath and Cors/Grafts Angiography;  Surgeon: Tonny Bollman, MD;  Location: [facility] INVASIVE CV LAB;  Service: Cardiovascular;  Laterality: N/A;  . CORONARY ARTERY BYPASS GRAFT  02/06/08   X 3    Current Medications: Outpatient Medications Prior to Visit  Medication Sig Dispense Refill  . aspirin EC 81 MG tablet Take 81 mg by mouth at bedtime.    Marland Kitchen L-Arginine POWD Take 1 scoop by mouth daily. Mix in water and drink    . metoprolol tartrate (LOPRESSOR) 25 MG tablet Take 0.5 tablets (12.5 mg total) by mouth 2 (two) times daily. 60 tablet 0  . nitroGLYCERIN (NITROSTAT) 0.4 MG SL tablet Place 1 tablet (0.4 mg total) under the tongue every 5 (five) minutes x 3 doses as needed for chest pain. 25 tablet 0  . omega-3 acid ethyl esters (LOVAZA) 1 g capsule Take 1 capsule (1 g total) by mouth 2 (two) times daily. *Please call and schedule a one year follow up appointment* 180 capsule  0  . rosuvastatin (CRESTOR) 20 MG tablet Take 1 tablet (20 mg total) by mouth daily. Please call and schedule a one year follow up appointment 30 tablet 0  . testosterone cypionate (DEPOTESTOSTERONE CYPIONATE) 200 MG/ML injection Inject 100 mg into the muscle every Tuesday.    . ticagrelor (BRILINTA) 90 MG TABS tablet Take 1 tablet (90 mg total) by mouth 2 (two) times daily. 180 tablet 4   Facility-Administered Medications Prior to Visit  Medication Dose Route Frequency Provider Last Rate Last Dose  . bivalirudin (ANGIOMAX) 250 mg in sodium chloride 0.9 % 50 mL (5 mg/mL) infusion    Continuous PRN Tonny BollmanMichael Cooper, MD   Stopped at 02/24/16 1511   . bivalirudin (ANGIOMAX) BOLUS via infusion    PRN Tonny BollmanMichael Cooper, MD   78.75 mg at 02/24/16 1428  . diphenhydrAMINE (BENADRYL) injection    PRN Tonny BollmanMichael Cooper, MD   50 mg at 02/24/16 1414  . famotidine (PEPCID) IVPB 20 mg premix    PRN Tonny BollmanMichael Cooper, MD   20 mg at 02/24/16 1413  . fentaNYL (SUBLIMAZE) injection    PRN Tonny BollmanMichael Cooper, MD   25 mcg at 02/24/16 1505  . heparin infusion 2 units/mL in 0.9 % sodium chloride    Continuous PRN Tonny BollmanMichael Cooper, MD   1,000 mL at 02/24/16 1526  . heparin injection    PRN Tonny BollmanMichael Cooper, MD   4,000 Units at 02/24/16 1417  . iopamidol (ISOVUE-370) 76 % injection    PRN Tonny BollmanMichael Cooper, MD   190 mL at 02/24/16 1525  . lidocaine (PF) (XYLOCAINE) 1 % injection    PRN Tonny BollmanMichael Cooper, MD   20 mL at 02/24/16 1419  . methylPREDNISolone sodium succinate (SOLU-MEDROL) 125 mg/2 mL injection    PRN Tonny BollmanMichael Cooper, MD   125 mg at 02/24/16 1414  . midazolam (VERSED) injection    PRN Tonny BollmanMichael Cooper, MD   1 mg at 02/24/16 1505  . ticagrelor (BRILINTA) tablet    PRN Tonny BollmanMichael Cooper, MD   180 mg at 02/24/16 1435     Allergies:   Contrast media [iodinated diagnostic agents] and Iodine solution [povidone iodine]   Social History   Social History  . Marital status: Married    Spouse name: N/A  . Number of children: N/A  . Years of education: N/A   Occupational History  . Financial advisor    Social History Main Topics  . Smoking status: Former Smoker    Packs/day: 0.50    Years: 20.00    Types: Cigarettes    Quit date: 01/07/2004  . Smokeless tobacco: None     Comment: Stopped 2566yrs ago  . Alcohol use 1.5 oz/week    3 drink(s) per week     Comment: OCCASIONAL  . Drug use: No  . Sexual activity: Not Asked   Other Topics Concern  . None   Social History Narrative   MARRIED   TOBACCO USE ON AND OFF   ETOH OCCASIONALLY   USES HERBAL SUPP. ALONG WITH A DAILY VITAMIN PACK   HAD STRESS TEST APPROX 5 YRS   NEW ONSET AFIB   SYNCOPE     Family History:   The patient's family history includes Coronary artery disease in his mother.     ROS:   Please see the history of present illness.    ROS All other systems reviewed and are negative.   PHYSICAL EXAM:   VS:  BP 132/78   Pulse 66   Ht 5\' 9"  (  1.753 m)   Wt 220 lb (99.8 kg)   BMI 32.49 kg/m    GEN: Well nourished, well developed, in no acute distress  HEENT: normal  Neck: no JVD, carotid bruits, or masses Cardiac: RRR; no murmurs, rubs, or gallops,no edema  Respiratory:  clear to auscultation bilaterally, normal work of breathing GI: soft, nontender, nondistended, + BS MS: no deformity or atrophy  Skin: warm and dry, no rash Neuro:  Alert and Oriented x 3, Strength and sensation are intact Psych: euthymic mood, full affect  Wt Readings from Last 3 Encounters:  03/03/16 220 lb (99.8 kg)  03/02/16 220 lb (99.8 kg)  02/24/16 231 lb 0.7 oz (104.8 kg)      Studies/Labs Reviewed:   EKG:  EKG is ordered today.  The ekg ordered today demonstrates NSR, IRBBB, non specific ST/TW changes.   Recent Labs: 02/24/2016: ALT 26; B Natriuretic Peptide 14.9 02/25/2016: BUN 12; Creatinine, Ser 0.93; Hemoglobin 16.2; Platelets 238; Potassium 4.4; Sodium 138   Lipid Panel    Component Value Date/Time   CHOL 140 02/25/2016 0515   TRIG 64 02/25/2016 0515   HDL 34 (L) 02/25/2016 0515   CHOLHDL 4.1 02/25/2016 0515   VLDL 13 02/25/2016 0515   LDLCALC 93 02/25/2016 0515    Additional studies/ records that were reviewed today include:    02/24/16 Procedures  Coronary Stent Intervention  Left Heart Cath and Cors/Grafts Angiography  Conclusion   1. Severe three-vessel coronary artery disease with severe proximal LAD stenosis, total occlusion of the third OM branch of the circumflex, and severe native RCA stenosis 2. Status post CABG with continued patency of the LIMA to OM 3, RIMA to LAD, and SVG to distal RCA 3. Severe diffuse vein graft disease of the SVG to distal RCA 4. Successful  native vessel PCI of the right coronary artery using a single drug-eluting stent 5. Mild segmental contraction abnormality the left ventricle with preserved overall LVEF  Recommendations: Aggressive post MI medical therapy, M.D. aPTT with aspirin and brilinta for at least 12 months.    _____________    ASSESSMENT & PLAN:    1. CAD/Inferior STEMI: Admitted with inferior STEMI on 02/24/16. The SVG to the RCA was severely diseased. The native RCA was treated with a DES x 1. LVEF is overall preserved with slight wall motion abnormality. Cleared to go back to playing tennis -- Continue on ASA, Brilinta, beta blocker and statin.   2. Hyperlipidemia: LDL 93. Goal <70. Tolerating Crestor 20mg  daily and Zetia 10mg  daily. Will recheck lipid/LFTs in 8 weeks  3. PAF: remote episode. NSR this admission. No indication for anticoagulation  4. Indigestion: will start a PPI   Medication Adjustments/Labs and Tests Ordered: Current medicines are reviewed at length with the patient today.  Concerns regarding medicines are outlined above.  Medication changes, Labs and Tests ordered today are listed in the Patient Instructions below. Patient Instructions  Medication Instructions:  Your physician has recommended you make the following change in your medication: 1.  START Zetia 10 mg taking 1 tablet daily 2.  START Protonix 40 mg taking 1 tablet daily   Labwork: 8 WEEKS:  FASTING LIPID & LFT  Testing/Procedures: None ordered  Follow-Up: Your physician recommends that you schedule a follow-up appointment in: 3 MONTHS WITH DR. Clifton JamesMCALHANY   Any Other Special Instructions Will Be Listed Below (If Applicable).     If you need a refill on your cardiac medications before your next appointment, please call your pharmacy.  Signed, Cline Crock, PA-C  03/03/2016 12:08 PM    [facility] [address] [place], [place], [place]  [address] Phone: [phone]; Fax: [phone]

## 2016-03-02 ENCOUNTER — Ambulatory Visit (INDEPENDENT_AMBULATORY_CARE_PROVIDER_SITE_OTHER): Payer: 59 | Admitting: Neurology

## 2016-03-02 ENCOUNTER — Encounter: Payer: Self-pay | Admitting: Neurology

## 2016-03-02 ENCOUNTER — Telehealth: Payer: Self-pay | Admitting: Physician Assistant

## 2016-03-02 VITALS — BP 119/71 | HR 66 | Resp 20 | Ht 69.0 in | Wt 220.0 lb

## 2016-03-02 DIAGNOSIS — I480 Paroxysmal atrial fibrillation: Secondary | ICD-10-CM

## 2016-03-02 DIAGNOSIS — R0683 Snoring: Secondary | ICD-10-CM | POA: Diagnosis not present

## 2016-03-02 DIAGNOSIS — R351 Nocturia: Secondary | ICD-10-CM

## 2016-03-02 DIAGNOSIS — R0681 Apnea, not elsewhere classified: Secondary | ICD-10-CM

## 2016-03-02 DIAGNOSIS — Z955 Presence of coronary angioplasty implant and graft: Secondary | ICD-10-CM | POA: Diagnosis not present

## 2016-03-02 DIAGNOSIS — R400 Somnolence: Secondary | ICD-10-CM

## 2016-03-02 DIAGNOSIS — Z951 Presence of aortocoronary bypass graft: Secondary | ICD-10-CM

## 2016-03-02 NOTE — Telephone Encounter (Signed)
Pt developed chest pain today and was concerned.   The pain does not remind him of his recent STEMI sx but he is concerned.  He was having more indigestion than usual at Thanksgiving. He saw the neurologist today and was told he had evidence of reflux on his throat exam.  He had spicy food for lunch today, just after that visit.  He has not taken any medication for the pain. It is a 3/10. It started this evening, not with exertion. He has not taken NTG, but has it at home.  Discussed options with pt and his wife. Advised it is not unusual to have reflux sx after being started on DAPT.  Advised I could start PPI tonight if they wished, but since sx not severe, he could try Mylanta tonight, keep f/u appt tomorrow. He prefers this. OK to try NTG. If sx get severe or remind him of his MI, call 911 and come to the ER.  Leanna BattlesBarrett, Linda Biehn, PA-C 03/02/2016 8:19 PM Beeper [phone]

## 2016-03-02 NOTE — Progress Notes (Signed)
Subjective:    Patient ID: Jacob Gibbs is a 55 y.o. male.  HPI     Huston Foley, MD, PhD [facility] [address], [address] [address] [place], [place] [address]  Dear Dr. Clelia Croft,   I saw your patient, Jacob Gibbs, upon your kind request in my neurologic clinic today for initial consultation of his sleep disorder, in particular, concern for underlying obstructive sleep apnea. The patient is accompanied by his wife today. As you know, Jacob Gibbs is a 55 year old right-handed gentleman with an underlying medical history of hypertension, obesity, hyperlipidemia, coronary artery disease, status post three-vessel CABG in 2009, paroxysmal A. fib, recent STEMI, status post drug-eluting stent placement and recent hospitalization in November 2017 for this, who reports snoring and excessive daytime somnolence, As well as witnessed apneic pauses while asleep. I reviewed your office note from 02/05/2016. Per wife, he has had breathing pauses while asleep, she noticed in the past few months. He does snore but more than the snoring she is worried about his apneic breathing pauses particularly with his recent cardiac stent placed and prior history of cardiac disease. He has had no recurrence of A. fib from what I understand had one episode in 2005. He exercises regularly and is trying to lose weight. He tries to drink water. He does not drink caffeine on a regular basis since his cardiac stent placement. His Epworth sleepiness score is 11 out of 24, his fatigue score is 21 out of 63. Bedtime is generally around 11:30, wakeup time around 6:30. He does not watch TV in bed. They have pets but not in the bedroom. He does not endorse any significant morning headaches, has nocturia about once per night on average, denies any overt restless leg symptoms and is not known to twitch his legs are kick in his sleep. He does have a tendency to get sleepy after lunch. He works as a Conservation officer, historic buildings. They have 2 children. He drinks alcohol a few times a week, not daily, quit smoking in 2004. He has one nephew with obstructive sleep apnea. He has a remote history of C6 fracture. He has a history of low testosterone but not currently on testosterone replacement.  His Past Medical History Is Significant For: Past Medical History:  Diagnosis Date  . Coronary artery disease    a. 2009: mutivessel CAD s/p CABG   b. 01/2016: inf STEMI s/p DES to RCA  . DDD (degenerative disc disease), cervical   . History of tobacco use    REMOTE  . Hyperlipidemia   . PAF (paroxysmal atrial fibrillation) (HCC)    a. brief episode in 2005 wtih no recurrence. No indication for OAC  . Syncope and collapse     His Past Surgical History Is Significant For: Past Surgical History:  Procedure Laterality Date  . CARDIAC CATHETERIZATION N/A 02/24/2016   Procedure: Coronary Stent Intervention;  Surgeon: Tonny Bollman, MD;  [facility] IN[facility]ASIVE CV LAB;  Service: Cardiovascular;  Laterality: N/A;  . CARDIAC CATHETERIZATION N/A 02/24/2016   Procedure: Left Heart Cath and Cors/Grafts Angiography;  Surgeon: Tonny Bollman, MD;  Location: [facility] INVASIVE CV LAB;  Service: Cardiovascular;  Laterality: N/A;  . CORONARY ARTERY BYPASS GRAFT  02/06/08   X 3    His Family History Is Significant For: Family History  Problem Relation Age of Onset  . Coronary artery disease Mother     ALSO ONE OF HIS UNCLES    His Social History Is Significant For: Social  History   Social History  . Marital status: Married    Spouse name: N/A  . Number of children: N/A  . Years of education: N/A   Occupational History  . Financial advisor    Social History Main Topics  . Smoking status: Former Smoker    Packs/day: 0.50    Years: 20.00    Types: Cigarettes    Quit date: 01/07/2004  . Smokeless tobacco: None     Comment: Stopped 42yrs ago  . Alcohol use 1.5 oz/week    3 drink(s) per week     Comment: OCCASIONAL  .  Drug use: No  . Sexual activity: Not Asked   Other Topics Concern  . None   Social History Narrative   MARRIED   TOBACCO USE ON AND OFF   ETOH OCCASIONALLY   USES HERBAL SUPP. ALONG WITH A DAILY VITAMIN PACK   HAD STRESS TEST APPROX 5 YRS   NEW ONSET AFIB   SYNCOPE    His Allergies Are:  Allergies  Allergen Reactions  . Contrast Media [Iodinated Diagnostic Agents] Other (See Comments)    Face turned scaly  . Iodine Solution [Povidone Iodine] Other (See Comments)    Face turned scaly  :   His Current Medications Are:  Outpatient Encounter Prescriptions as of 03/02/2016  Medication Sig  . aspirin EC 81 MG tablet Take 81 mg by mouth at bedtime.  Marland Kitchen L-Arginine POWD Take 1 scoop by mouth daily. Mix in water and drink  . metoprolol tartrate (LOPRESSOR) 25 MG tablet Take 0.5 tablets (12.5 mg total) by mouth 2 (two) times daily.  . nitroGLYCERIN (NITROSTAT) 0.4 MG SL tablet Place 1 tablet (0.4 mg total) under the tongue every 5 (five) minutes x 3 doses as needed for chest pain.  Marland Kitchen omega-3 acid ethyl esters (LOVAZA) 1 g capsule Take 1 capsule (1 g total) by mouth 2 (two) times daily. *Please call and schedule a one year follow up appointment*  . rosuvastatin (CRESTOR) 20 MG tablet Take 1 tablet (20 mg total) by mouth daily. Please call and schedule a one year follow up appointment  . testosterone cypionate (DEPOTESTOSTERONE CYPIONATE) 200 MG/ML injection Inject 100 mg into the muscle every Tuesday.  . ticagrelor (BRILINTA) 90 MG TABS tablet Take 1 tablet (90 mg total) by mouth 2 (two) times daily.   Facility-Administered Encounter Medications as of 03/02/2016  Medication  . bivalirudin (ANGIOMAX) 250 mg in sodium chloride 0.9 % 50 mL (5 mg/mL) infusion  . bivalirudin (ANGIOMAX) BOLUS via infusion  . diphenhydrAMINE (BENADRYL) injection  . famotidine (PEPCID) IVPB 20 mg premix  . fentaNYL (SUBLIMAZE) injection  . heparin infusion 2 units/mL in 0.9 % sodium chloride  . heparin  injection  . iopamidol (ISOVUE-370) 76 % injection  . lidocaine (PF) (XYLOCAINE) 1 % injection  . methylPREDNISolone sodium succinate (SOLU-MEDROL) 125 mg/2 mL injection  . midazolam (VERSED) injection  . ticagrelor (BRILINTA) tablet  :  Review of Systems:  Out of a complete 14 point review of systems, all are reviewed and negative with the exception of these symptoms as listed below:` Review of Systems  Neurological:       Pt presents today for a sleep consult. Pt's wife reports that he snores and stops breathing in his sleep. Pt has never had a sleep study.  Epworth Sleepiness Scale 0= would never doze 1= slight chance of dozing 2= moderate chance of dozing 3= high chance of dozing  Sitting and reading: 3 Watching TV:  2 Sitting inactive in a public place (ex. Theater or meeting): 1 As a passenger in a car for an hour without a break: 1 Lying down to rest in the afternoon: 2 Sitting and talking to someone: 0 Sitting quietly after lunch (no alcohol): 2 In a car, while stopped in traffic:0 Total: 11    Objective:  Neurologic Exam  Physical Exam Physical Examination:   Vitals:   03/02/16 1122  BP: 119/71  Pulse: 66  Resp: 20   General Examination: The patient is a very pleasant 55 y.o. male in no acute distress. He appears well-developed and well-nourished and very well groomed.   HEENT: Normocephalic, atraumatic, pupils are equal, round and reactive to light and accommodation. Funduscopic exam is normal with sharp disc margins noted. Extraocular tracking is good without limitation to gaze excursion or nystagmus noted. Normal smooth pursuit is noted. Hearing is grossly intact. Tympanic membranes are clear bilaterally. Face is symmetric with normal facial animation and normal facial sensation. Speech is clear with no dysarthria noted. There is no hypophonia. There is no lip, neck/head, jaw or voice tremor. Neck is supple with full range of passive and active motion. There  are no carotid bruits on auscultation. Oropharynx exam reveals: mild mouth dryness, good dental hygiene and moderate airway crowding, due to Larger appearing tongue, redundant soft palate. Mallampati is class III. Tongue protrudes centrally and palate elevates symmetrically. Tonsils are absent. Neck size is 17.5 inches. He has a Mild overbite.   Chest: Clear to auscultation without wheezing, rhonchi or crackles noted.  Heart: S1+S2+0, regular and normal without murmurs, rubs or gallops noted.   Abdomen: Soft, non-tender and non-distended with normal bowel sounds appreciated on auscultation.  Extremities: There is trace pitting edema in the distal lower extremities bilaterally. Pedal pulses are intact.  Skin: Warm and dry without trophic changes noted. There are no varicose veins.  Musculoskeletal: exam reveals no obvious joint deformities, tenderness or joint swelling or erythema.   Neurologically:  Mental status: The patient is awake, alert and oriented in all 4 spheres. His immediate and remote memory, attention, language skills and fund of knowledge are appropriate. There is no evidence of aphasia, agnosia, apraxia or anomia. Speech is clear with normal prosody and enunciation. Thought process is linear. Mood is normal and affect is normal.  Cranial nerves II - XII are as described above under HEENT exam. In addition: shoulder shrug is normal with equal shoulder height noted. Motor exam: Normal bulk, strength and tone is noted. There is no drift, tremor or rebound. Romberg is negative. Reflexes are 2+ throughout. Fine motor skills and coordination: intact with normal finger taps, normal hand movements, normal rapid alternating patting, normal foot taps and normal foot agility.  Cerebellar testing: No dysmetria or intention tremor on finger to nose testing. Heel to shin is unremarkable bilaterally. There is no truncal or gait ataxia.  Sensory exam: intact to light touch, pinprick, vibration,  temperature sense in the upper and lower extremities.  Gait, station and balance: He stands easily. No veering to one side is noted. No leaning to one side is noted. Posture is age-appropriate and stance is narrow based. Gait shows normal stride length and normal pace. No problems turning are noted. Tandem walk is unremarkable.   Assessment and Plan:  In summary, Jacob SidleGary L Bialy is a very pleasant 55 y.o.-year old male with an underlying medical history of hypertension, obesity, hyperlipidemia, coronary artery disease, status post three-vessel CABG in 2009, paroxysmal A. fib, recent  STEMI, status post drug-eluting stent placement and recent hospitalization in November 2017 for this, whose history and physical exam are concerning for obstructive sleep apnea (OSA). I had a long chat with the patient and his wife about my findings and the diagnosis of OSA, its prognosis and treatment options. We talked about medical treatments, surgical interventions and non-pharmacological approaches. I explained in particular the risks and ramifications of untreated moderate to severe OSA, especially with respect to developing cardiovascular disease down the Road, including congestive heart failure, difficult to treat hypertension, cardiac arrhythmias, or stroke. Even type 2 diabetes has, in part, been linked to untreated OSA. Symptoms of untreated OSA include daytime sleepiness, memory problems, mood irritability and mood disorder such as depression and anxiety, lack of energy, as well as recurrent headaches, especially morning headaches. We talked about trying to maintain a healthy lifestyle in general, as well as the importance of weight control. I encouraged the patient to eat healthy, exercise daily and keep well hydrated, to keep a scheduled bedtime and wake time routine, to not skip any meals and eat healthy snacks in between meals. I advised the patient not to drive when feeling sleepy. I recommended the following at  this time: sleep study with potential positive airway pressure titration. (We will score hypopneas at 4% and split the sleep study into diagnostic and treatment portion, if the estimated. 2 hour AHI is >20/h).   I explained the sleep test procedure to the patient and also outlined possible surgical and non-surgical treatment options of OSA, including the use of a custom-made dental device (which would require a referral to a specialist dentist or oral surgeon), upper airway surgical options, such as pillar implants, radiofrequency surgery, tongue base surgery, and UPPP (which would involve a referral to an ENT surgeon). Rarely, jaw surgery such as mandibular advancement may be considered.  I also explained the CPAP treatment option to the patient, who indicated that he would be willing to try CPAP if the need arises. I explained the importance of being compliant with PAP treatment, not only for insurance purposes but primarily to improve His symptoms, and for the patient's long term health benefit, including to reduce His cardiovascular risks. I answered all their questions today and the patient and his wife were in agreement. I would like to see him back after the sleep study is completed and encouraged him to call with any interim questions, concerns, problems or updates.   Thank you very much for allowing me to participate in the care of this nice patient. If I can be of any further assistance to you please do not hesitate to call me at [phone].  Sincerely,   Huston FoleySaima Shalaunda Weatherholtz, MD, PhD

## 2016-03-02 NOTE — Patient Instructions (Signed)

## 2016-03-03 ENCOUNTER — Ambulatory Visit (INDEPENDENT_AMBULATORY_CARE_PROVIDER_SITE_OTHER): Payer: 59 | Admitting: Physician Assistant

## 2016-03-03 ENCOUNTER — Encounter: Payer: Self-pay | Admitting: Physician Assistant

## 2016-03-03 VITALS — BP 132/78 | HR 66 | Ht 69.0 in | Wt 220.0 lb

## 2016-03-03 DIAGNOSIS — E785 Hyperlipidemia, unspecified: Secondary | ICD-10-CM

## 2016-03-03 DIAGNOSIS — I480 Paroxysmal atrial fibrillation: Secondary | ICD-10-CM

## 2016-03-03 DIAGNOSIS — I2510 Atherosclerotic heart disease of native coronary artery without angina pectoris: Secondary | ICD-10-CM | POA: Diagnosis not present

## 2016-03-03 DIAGNOSIS — K219 Gastro-esophageal reflux disease without esophagitis: Secondary | ICD-10-CM | POA: Diagnosis not present

## 2016-03-03 MED ORDER — EZETIMIBE 10 MG PO TABS: 10.0000 mg | ORAL_TABLET | Freq: Every day | ORAL | 3 refills | Status: DC

## 2016-03-03 MED ORDER — PANTOPRAZOLE SODIUM 40 MG PO TBEC
40.0000 mg | DELAYED_RELEASE_TABLET | Freq: Every day | ORAL | 0 refills | Status: DC
Start: 2016-03-03 — End: 2016-03-05

## 2016-03-03 MED ORDER — PANTOPRAZOLE SODIUM 40 MG PO TBEC: 40.0000 mg | DELAYED_RELEASE_TABLET | Freq: Every day | ORAL | 11 refills | Status: DC

## 2016-03-03 NOTE — Patient Instructions (Addendum)
Medication Instructions:  Your physician has recommended you make the following change in your medication: 1.  START Zetia 10 mg taking 1 tablet daily 2.  START Protonix 40 mg taking 1 tablet daily   Labwork: 8 WEEKS:  FASTING LIPID & LFT  Testing/Procedures: None ordered  Follow-Up: Your physician recommends that you schedule a follow-up appointment in: 3 MONTHS WITH DR. Clifton JamesMCALHANY   Any Other Special Instructions Will Be Listed Below (If Applicable).     If you need a refill on your cardiac medications before your next appointment, please call your pharmacy.

## 2016-03-05 ENCOUNTER — Other Ambulatory Visit: Payer: Self-pay

## 2016-03-05 MED ORDER — PANTOPRAZOLE SODIUM 40 MG PO TBEC: 40.0000 mg | DELAYED_RELEASE_TABLET | Freq: Every day | ORAL | 3 refills | Status: DC

## 2016-03-19 ENCOUNTER — Telehealth: Payer: Self-pay | Admitting: Neurology

## 2016-03-19 DIAGNOSIS — R0683 Snoring: Secondary | ICD-10-CM

## 2016-03-19 DIAGNOSIS — R0681 Apnea, not elsewhere classified: Secondary | ICD-10-CM

## 2016-03-19 DIAGNOSIS — R351 Nocturia: Secondary | ICD-10-CM

## 2016-03-19 NOTE — Telephone Encounter (Signed)
[employer] denied Split but approved HST.  Can I get an order for HST? °

## 2016-03-19 NOTE — Telephone Encounter (Signed)
Home sleep test order placed.

## 2016-03-24 ENCOUNTER — Encounter (HOSPITAL_COMMUNITY): Payer: Self-pay | Admitting: *Deleted

## 2016-04-08 ENCOUNTER — Encounter (INDEPENDENT_AMBULATORY_CARE_PROVIDER_SITE_OTHER): Payer: 59 | Admitting: Neurology

## 2016-04-08 DIAGNOSIS — R0681 Apnea, not elsewhere classified: Secondary | ICD-10-CM

## 2016-04-08 DIAGNOSIS — R0683 Snoring: Secondary | ICD-10-CM

## 2016-04-08 DIAGNOSIS — R351 Nocturia: Secondary | ICD-10-CM

## 2016-04-08 DIAGNOSIS — G4733 Obstructive sleep apnea (adult) (pediatric): Secondary | ICD-10-CM

## 2016-04-09 ENCOUNTER — Telehealth: Payer: Self-pay | Admitting: Neurology

## 2016-04-09 DIAGNOSIS — I480 Paroxysmal atrial fibrillation: Secondary | ICD-10-CM

## 2016-04-09 DIAGNOSIS — Z951 Presence of aortocoronary bypass graft: Secondary | ICD-10-CM

## 2016-04-09 DIAGNOSIS — G4734 Idiopathic sleep related nonobstructive alveolar hypoventilation: Secondary | ICD-10-CM

## 2016-04-09 DIAGNOSIS — Z955 Presence of coronary angioplasty implant and graft: Secondary | ICD-10-CM

## 2016-04-09 DIAGNOSIS — G4733 Obstructive sleep apnea (adult) (pediatric): Secondary | ICD-10-CM

## 2016-04-09 NOTE — Telephone Encounter (Signed)
Patient referred by Dr. Clelia CroftShaw, seen by me on 03/02/16, HST on 04/08/16:  Please call and notify the patient that the recent home sleep test did suggest the diagnosis of severe obstructive sleep apnea and that I recommend treatment for this in the form of CPAP. I will request an overnight sleep study for proper titration and mask fitting. Please explain to patient and arrange for a CPAP titration study. I have placed an order in the chart. Thanks, and please route to [facility] for [facility].   Huston FoleySaima Jeanne Terrance, MD, PhD [facility])

## 2016-04-13 ENCOUNTER — Telehealth: Payer: Self-pay | Admitting: Neurology

## 2016-04-13 DIAGNOSIS — G4733 Obstructive sleep apnea (adult) (pediatric): Secondary | ICD-10-CM

## 2016-04-13 NOTE — Telephone Encounter (Signed)
[employer] denied CPAP suggested autopap °

## 2016-04-13 NOTE — Telephone Encounter (Signed)
Insurance denied titration study, prefers auto-pap

## 2016-04-17 NOTE — Telephone Encounter (Signed)
We will set patient up with autoPAP at home, as insurance denied in house titration study for OSA. Pls process order and notify patient and set up FU in 8-10 weeks.       

## 2016-04-21 NOTE — Telephone Encounter (Signed)
I spoke to patient and he is aware of results and recommendations. He is willing to start treatment. I will send orders to [facility]. I have sent report to PCP. Patient will get a letter reminding him to make f/u appt and stress the importance of compliance.  

## 2016-04-21 NOTE — Telephone Encounter (Signed)
See other phone note, Dr. Frances FurbishAthar has ordered Auto.

## 2016-04-27 ENCOUNTER — Other Ambulatory Visit: Payer: 59 | Admitting: *Deleted

## 2016-04-27 DIAGNOSIS — E7800 Pure hypercholesterolemia, unspecified: Secondary | ICD-10-CM | POA: Diagnosis not present

## 2016-04-27 DIAGNOSIS — I480 Paroxysmal atrial fibrillation: Secondary | ICD-10-CM

## 2016-04-27 DIAGNOSIS — I2510 Atherosclerotic heart disease of native coronary artery without angina pectoris: Secondary | ICD-10-CM

## 2016-04-28 LAB — HEPATIC FUNCTION PANEL
ALK PHOS: 75 IU/L (ref 39–117)
ALT: 38 IU/L (ref 0–44)
AST: 21 IU/L (ref 0–40)
Albumin: 4.5 g/dL (ref 3.5–5.5)
BILIRUBIN TOTAL: 0.8 mg/dL (ref 0.0–1.2)
Bilirubin, Direct: 0.23 mg/dL (ref 0.00–0.40)
Total Protein: 6.5 g/dL (ref 6.0–8.5)

## 2016-04-28 LAB — LIPID PANEL
Chol/HDL Ratio: 3.2 ratio units (ref 0.0–5.0)
Cholesterol, Total: 102 mg/dL (ref 100–199)
HDL: 32 mg/dL — ABNORMAL LOW (ref >39)
LDL CALC: 53 mg/dL (ref 0–99)
TRIGLYCERIDES: 86 mg/dL (ref 0–149)
VLDL CHOLESTEROL CAL: 17 mg/dL (ref 5–40)

## 2016-04-30 DIAGNOSIS — G4733 Obstructive sleep apnea (adult) (pediatric): Secondary | ICD-10-CM | POA: Diagnosis not present

## 2016-05-07 ENCOUNTER — Other Ambulatory Visit: Payer: Self-pay | Admitting: Cardiovascular Disease

## 2016-05-12 ENCOUNTER — Telehealth: Payer: Self-pay | Admitting: Cardiovascular Disease

## 2016-05-12 ENCOUNTER — Telehealth: Payer: Self-pay | Admitting: Neurology

## 2016-05-12 DIAGNOSIS — G4733 Obstructive sleep apnea (adult) (pediatric): Secondary | ICD-10-CM

## 2016-05-12 NOTE — Telephone Encounter (Signed)
I called pt. He started auto pap on 04/30/2016 and is really struggling with the mask and the pressures. Pt feels that the pressures need to be increased. An appt was made for 06/09/2016 at 9:30am with Dr. Frances FurbishAthar.  In the meantime, the pt wants to see if Dr. Frances FurbishAthar can adjust the pressures on his autopap.

## 2016-05-12 NOTE — Addendum Note (Signed)
Addended by: Huston FoleyATHAR, Dequan Kindred on: 05/12/2016 05:59 PM   Modules accepted: Orders

## 2016-05-12 NOTE — Telephone Encounter (Signed)
Follow Up:; ° ° °Returning your call. °

## 2016-05-12 NOTE — Telephone Encounter (Signed)
New message  Darel HongJudy from [facility] case Manage call requesting to speak with RN about some questions she had about the authorization for medication Repatha. Please call back to disucss

## 2016-05-12 NOTE — Telephone Encounter (Signed)
I reviewed his AutoPap compliance data for the past 30 days, he used his AutoPap 12 days, percent used days greater than 4 hours at 13% only, residual AHI suboptimal at 9.4 per hour, 95th percentile pressure at 11.4 cm, leak acceptable with the 95th percentile at 15.8. Looks like he just started AutoPap therapy on 04/30/2016, so obviously not 30 days yet. Average usage is 2 hours and 45 minutes, certainly not enough, AutoSet range of 5-15 cm with EPR. We can try on a set pressure of 12 cm. I will place an order for this. We will fax to his DME company.

## 2016-05-12 NOTE — Telephone Encounter (Signed)
Left message to call back  

## 2016-05-12 NOTE — Telephone Encounter (Signed)
Patient is calling in reference to autoPAP machine.  Patient states he feels like his pressure needs to be adjusted due to not having any sleep at all.  Recently changed face mask, but the mask feels the machine is not getting enough air early on.  Please call

## 2016-05-13 NOTE — Telephone Encounter (Signed)
I called pt to discuss. No answer, left a detailed message on his cell phone number, per DPR. I advised him that Dr. Frances FurbishAthar changed his pressure from a range of pressures to a set pressure of 12 cm H2O. I asked him to please use his cpap at least 4 hours per night. I advised him that [facility] will be changing his pressure. I asked pt to call me back with any further questions or concerns.

## 2016-05-14 NOTE — Telephone Encounter (Signed)
Left message to call back  

## 2016-05-14 NOTE — Telephone Encounter (Signed)
New Message     Returning Pats call about Repatha authorization

## 2016-05-15 NOTE — Telephone Encounter (Signed)
[facility] She states pt told them Repatha had been rejected twice by insurance. Doody reports they received request for Repatha on 04/07/16 and it was denied on 04/08/16.  She states it can be resubmitted.  I do not see this in the chart but will send to pharmacist to see if they have sent in request.

## 2016-05-15 NOTE — Telephone Encounter (Signed)
We have not sent in a request. Doesn't look like pt was even seen in office on the day they state a request was sent in. If anyone has submitted a PA for Repatha it came from a different office since we have no record of it.

## 2016-05-15 NOTE — Telephone Encounter (Signed)
Please please please give Darel HongJudy a call back today if possible.

## 2016-05-15 NOTE — Telephone Encounter (Signed)
I left information from Margaretmary DysMegan Supple PharmD on case manager's identified voicemail. Left message to call office if questions

## 2016-05-15 NOTE — Telephone Encounter (Signed)
Left message to call back  

## 2016-05-19 ENCOUNTER — Encounter (HOSPITAL_COMMUNITY): Payer: Self-pay | Admitting: Internal Medicine

## 2016-05-19 NOTE — Progress Notes (Signed)
Mailed patient letter with information about Cardiac Rehab program. MW °

## 2016-05-28 DIAGNOSIS — G4733 Obstructive sleep apnea (adult) (pediatric): Secondary | ICD-10-CM | POA: Diagnosis not present

## 2016-06-04 ENCOUNTER — Encounter: Payer: Self-pay | Admitting: Cardiovascular Disease

## 2016-06-05 DIAGNOSIS — I25810 Atherosclerosis of coronary artery bypass graft(s) without angina pectoris: Secondary | ICD-10-CM | POA: Diagnosis not present

## 2016-06-05 DIAGNOSIS — R0789 Other chest pain: Secondary | ICD-10-CM | POA: Diagnosis not present

## 2016-06-05 DIAGNOSIS — M546 Pain in thoracic spine: Secondary | ICD-10-CM | POA: Diagnosis not present

## 2016-06-09 ENCOUNTER — Ambulatory Visit: Payer: Self-pay | Admitting: Neurology

## 2016-06-09 ENCOUNTER — Telehealth: Payer: Self-pay

## 2016-06-09 NOTE — Telephone Encounter (Signed)
Yesterday I called the number available for this patient, male voice stated that I had the wrong number. I needed to cancel the patient's appt due to the adverse weather we had yesterday. Office opened at 10am today. I will send patient a letter.

## 2016-06-11 ENCOUNTER — Ambulatory Visit (INDEPENDENT_AMBULATORY_CARE_PROVIDER_SITE_OTHER): Payer: 59 | Admitting: Cardiovascular Disease

## 2016-06-11 ENCOUNTER — Encounter (INDEPENDENT_AMBULATORY_CARE_PROVIDER_SITE_OTHER): Payer: Self-pay

## 2016-06-11 ENCOUNTER — Encounter: Payer: Self-pay | Admitting: Cardiovascular Disease

## 2016-06-11 VITALS — BP 108/58 | HR 76 | Ht 69.0 in | Wt 226.0 lb

## 2016-06-11 DIAGNOSIS — I480 Paroxysmal atrial fibrillation: Secondary | ICD-10-CM | POA: Diagnosis not present

## 2016-06-11 DIAGNOSIS — I2510 Atherosclerotic heart disease of native coronary artery without angina pectoris: Secondary | ICD-10-CM

## 2016-06-11 DIAGNOSIS — E785 Hyperlipidemia, unspecified: Secondary | ICD-10-CM | POA: Diagnosis not present

## 2016-06-11 NOTE — Progress Notes (Signed)
Chief Complaint  Patient presents with  . paroxysmal atrial fibrillation    follow up     History of Present Illness: 56 yo male with history of paroxysmal atrial fibrillation, HLD and CAD s/p 3V CABG in 2009 with Dr. Cornelius Moras (RIMA - LAD, LIMA - OM3, SVG- RCA) who is here today for cardiac follow up. Exercise stress test November 2015 with no ischemia. Echo December 2015 with normal LV size and function, mild valve disease. He was admitted to [facility] November 2017 via EMS with c/o left shoulder pain. Code STEMI called. Emergent cardiac cath with severe diffuse disease in the SVG to PDA. A drug eluting stent was placed in the native RCA. LVEF by LV gram 55% with subtle hypokinesis of the inferior wall. Troponin negative.   He is here today for follow up. He has had no chest pain, SOB, palpitations. He remains very active.  He had some left shoulder pain last week but EKG ok in primary care.   Primary Care Physician: Martha Clan, MD  Past Medical History:  Diagnosis Date  . Coronary artery disease    a. 2009: mutivessel CAD s/p CABG   b. 01/2016: inf STEMI s/p DES to RCA  . DDD (degenerative disc disease), cervical   . History of tobacco use    REMOTE  . Hyperlipidemia   . PAF (paroxysmal atrial fibrillation) (HCC)    a. brief episode in 2005 wtih no recurrence. No indication for OAC  . Syncope and collapse     Past Surgical History:  Procedure Laterality Date  . CARDIAC CATHETERIZATION N/A 02/24/2016   Procedure: Coronary Stent Intervention;  Surgeon: Tonny Bollman, MD;  Location: [facility] INVASIVE CV LAB;  Service: Cardiovascular;  Laterality: N/A;  . CARDIAC CATHETERIZATION N/A 02/24/2016   Procedure: Left Heart Cath and Cors/Grafts Angiography;  Surgeon: Tonny Bollman, MD;  Location: [facility] INVASIVE CV LAB;  Service: Cardiovascular;  Laterality: N/A;  . CORONARY ARTERY BYPASS GRAFT  02/06/08   X 3    Current Outpatient Prescriptions  Medication Sig Dispense Refill  . aspirin EC 81 MG  tablet Take 81 mg by mouth at bedtime.    Marland Kitchen ezetimibe (ZETIA) 10 MG tablet Take 1 tablet (10 mg total) by mouth daily. 90 tablet 3  . L-Arginine POWD Take 1 scoop by mouth daily. Mix in water and drink    . metoprolol tartrate (LOPRESSOR) 25 MG tablet Take 0.5 tablets (12.5 mg total) by mouth 2 (two) times daily. 60 tablet 0  . nitroGLYCERIN (NITROSTAT) 0.4 MG SL tablet Place 1 tablet (0.4 mg total) under the tongue every 5 (five) minutes x 3 doses as needed for chest pain. 25 tablet 0  . omega-3 acid ethyl esters (LOVAZA) 1 g capsule TAKE 1 CAPSULE BY MOUTH 2  TIMES DAILY 180 capsule 0  . pantoprazole (PROTONIX) 40 MG tablet Take 1 tablet (40 mg total) by mouth daily. 90 tablet 3  . rosuvastatin (CRESTOR) 20 MG tablet Take 1 tablet (20 mg total) by mouth daily. Please call and schedule a one year follow up appointment 30 tablet 0  . testosterone cypionate (DEPOTESTOSTERONE CYPIONATE) 200 MG/ML injection Inject 100 mg into the muscle every Tuesday.    . ticagrelor (BRILINTA) 90 MG TABS tablet Take 1 tablet (90 mg total) by mouth 2 (two) times daily. 180 tablet 4   No current facility-administered medications for this visit.    Facility-Administered Medications Ordered in Other Visits  Medication Dose Route Frequency Provider Last Rate Last  Dose  . bivalirudin (ANGIOMAX) 250 mg in sodium chloride 0.9 % 50 mL (5 mg/mL) infusion    Continuous PRN Tonny Bollman, MD   Stopped at 02/24/16 1511  . bivalirudin (ANGIOMAX) BOLUS via infusion    PRN Tonny Bollman, MD   78.75 mg at 02/24/16 1428  . diphenhydrAMINE (BENADRYL) injection    PRN Tonny Bollman, MD   50 mg at 02/24/16 1414  . famotidine (PEPCID) IVPB 20 mg premix    PRN Tonny Bollman, MD   20 mg at 02/24/16 1413  . fentaNYL (SUBLIMAZE) injection    PRN Tonny Bollman, MD   25 mcg at 02/24/16 1505  . heparin infusion 2 units/mL in 0.9 % sodium chloride    Continuous PRN Tonny Bollman, MD   1,000 mL at 02/24/16 1526  . heparin injection    PRN  Tonny Bollman, MD   4,000 Units at 02/24/16 1417  . iopamidol (ISOVUE-370) 76 % injection    PRN Tonny Bollman, MD   190 mL at 02/24/16 1525  . lidocaine (PF) (XYLOCAINE) 1 % injection    PRN Tonny Bollman, MD   20 mL at 02/24/16 1419  . methylPREDNISolone sodium succinate (SOLU-MEDROL) 125 mg/2 mL injection    PRN Tonny Bollman, MD   125 mg at 02/24/16 1414  . midazolam (VERSED) injection    PRN Tonny Bollman, MD   1 mg at 02/24/16 1505  . ticagrelor (BRILINTA) tablet    PRN Tonny Bollman, MD   180 mg at 02/24/16 1435    Allergies  Allergen Reactions  . Contrast Media [Iodinated Diagnostic Agents] Other (See Comments)    Face turned scaly  . Iodine Solution [Povidone Iodine] Other (See Comments)    Face turned scaly    Social History   Social History  . Marital status: Married    Spouse name: N/A  . Number of children: N/A  . Years of education: N/A   Occupational History  . Financial advisor    Social History Main Topics  . Smoking status: Former Smoker    Packs/day: 0.50    Years: 20.00    Types: Cigarettes    Quit date: 01/07/2004  . Smokeless tobacco: Never Used     Comment: Stopped 74yrs ago  . Alcohol use 1.5 oz/week    3 Standard drinks or equivalent per week     Comment: OCCASIONAL  . Drug use: No  . Sexual activity: Not on file   Other Topics Concern  . Not on file   Social History Narrative   MARRIED   TOBACCO USE ON AND OFF   ETOH OCCASIONALLY   USES HERBAL SUPP. ALONG WITH A DAILY VITAMIN PACK   HAD STRESS TEST APPROX 5 YRS   NEW ONSET AFIB   SYNCOPE    Family History  Problem Relation Age of Onset  . Coronary artery disease Mother     ALSO ONE OF HIS UNCLES    Review of Systems:  As stated in the HPI and otherwise negative.   BP (!) 108/58   Pulse 76   Ht 5\' 9"  (1.753 m)   Wt 226 lb (102.5 kg)   BMI 33.37 kg/m   Physical Examination: General: Well developed, well nourished, NAD  HEENT: OP clear, mucus membranes moist  SKIN:  warm, dry. No rashes. Neuro: No focal deficits  Musculoskeletal: Muscle strength 5/5 all ext  Psychiatric: Mood and affect normal  Neck: No JVD, no carotid bruits, no thyromegaly, no lymphadenopathy.  Lungs:Clear bilaterally, no wheezes, rhonci, crackles Cardiovascular: Regular rate and rhythm. No murmurs, gallops or rubs. Abdomen:Soft. Bowel sounds present. Non-tender.  Extremities: No lower extremity edema. Pulses are 2 + in the bilateral DP/PT.  Echo 03/12/14: Left ventricle: The cavity size was normal. Wall thickness was increased in a pattern of mild LVH. Systolic function was normal. The estimated ejection fraction was in the range of 55% to 60%. Although no diagnostic regional wall motion abnormality was identified, this possibility cannot be completely excluded on the basis of this study. Diastolic function was not fully assessed. - Aortic valve: There was no stenosis. - Aorta: Mildly dilated aortic root. Aortic root dimension: 37 mm (ED). - Mitral valve: Mildly calcified annulus. Mildly calcified leaflets . There was trivial regurgitation. - Tricuspid valve: Peak RV-RA gradient (S): 24 mm Hg. - Pulmonary arteries: PA peak pressure: 27 mm Hg (S). - Inferior vena cava: The vessel was normal in size. The respirophasic diameter changes were in the normal range (= 50%), consistent with normal central venous pressure.  Impressions:  - Normal LV size with mild LV hypertrophy. EF 55-60%. Normal RV size and systolic function. No significant valvular abnormalities.   EKG:  EKG is not ordered today. The ekg ordered today demonstrates   Recent Labs: 02/24/2016: B Natriuretic Peptide 14.9 02/25/2016: BUN 12; Creatinine, Ser 0.93; Hemoglobin 16.2; Platelets 238; Potassium 4.4; Sodium 138 04/27/2016: ALT 38   Lipid Panel    Component Value Date/Time   CHOL 102 04/27/2016 0907   TRIG 86 04/27/2016 0907   HDL 32 (L) 04/27/2016 0907   CHOLHDL 3.2  04/27/2016 0907   CHOLHDL 4.1 02/25/2016 0515   VLDL 13 02/25/2016 0515   LDLCALC 53 04/27/2016 0907     Wt Readings from Last 3 Encounters:  06/11/16 226 lb (102.5 kg)  03/03/16 220 lb (99.8 kg)  03/02/16 220 lb (99.8 kg)     Other studies Reviewed: Additional studies/ records that were reviewed today include: . Review of the above records demonstrates:    Assessment and Plan:   1. CAD s/p CABG without angina: He is having no dyspnea or chest pain suggestive of angina. He presented with an inferior STEMI in November 2017. Cardiac cath November 2017 with pending failure of SVG to RCA. A DES was placed in the RCA. Will continue ASA, Brilinta, beta blocker and statin.   2. Paroxysmal atrial fibrillation: Sinus today. His episode of atrial fibrillation occurred in 2005. No recurrence. No indication for anti-coagulation.   3. HLD: He is on a statin and Zetia. LDL at goal.   Current medicines are reviewed at length with the patient today.  The patient does not have concerns regarding medicines.  The following changes have been made:  no change  Labs/ tests ordered today include:   No orders of the defined types were placed in this encounter.    Disposition:   FU with me in 12  months   Signed, Verne Carrowhristopher Cameran Pettey, MD 06/11/2016 1:41 PM    [facility] [address], [place], [place]  [address] Phone: [phone]; Fax: [phone]

## 2016-06-11 NOTE — Patient Instructions (Signed)

## 2016-06-28 DIAGNOSIS — G4733 Obstructive sleep apnea (adult) (pediatric): Secondary | ICD-10-CM | POA: Diagnosis not present

## 2016-07-27 ENCOUNTER — Other Ambulatory Visit: Payer: Self-pay | Admitting: Cardiovascular Disease

## 2016-07-28 DIAGNOSIS — G4733 Obstructive sleep apnea (adult) (pediatric): Secondary | ICD-10-CM | POA: Diagnosis not present

## 2016-08-28 DIAGNOSIS — G4733 Obstructive sleep apnea (adult) (pediatric): Secondary | ICD-10-CM | POA: Diagnosis not present

## 2016-09-27 DIAGNOSIS — G4733 Obstructive sleep apnea (adult) (pediatric): Secondary | ICD-10-CM | POA: Diagnosis not present

## 2016-09-29 ENCOUNTER — Encounter: Payer: Self-pay | Admitting: Neurology

## 2016-09-29 ENCOUNTER — Ambulatory Visit (INDEPENDENT_AMBULATORY_CARE_PROVIDER_SITE_OTHER): Payer: 59 | Admitting: Neurology

## 2016-09-29 ENCOUNTER — Encounter (INDEPENDENT_AMBULATORY_CARE_PROVIDER_SITE_OTHER): Payer: Self-pay

## 2016-09-29 VITALS — BP 119/81 | HR 64 | Ht 69.0 in | Wt 224.0 lb

## 2016-09-29 DIAGNOSIS — Z789 Other specified health status: Secondary | ICD-10-CM

## 2016-09-29 DIAGNOSIS — R0981 Nasal congestion: Secondary | ICD-10-CM | POA: Diagnosis not present

## 2016-09-29 DIAGNOSIS — G4733 Obstructive sleep apnea (adult) (pediatric): Secondary | ICD-10-CM | POA: Diagnosis not present

## 2016-09-29 DIAGNOSIS — Z951 Presence of aortocoronary bypass graft: Secondary | ICD-10-CM

## 2016-09-29 DIAGNOSIS — Z9989 Dependence on other enabling machines and devices: Secondary | ICD-10-CM | POA: Diagnosis not present

## 2016-09-29 NOTE — Progress Notes (Signed)
Subjective:    Patient ID: Jacob Gibbs is a 56 y.o. male.  HPI     Interim history:   Jacob Gibbs is a 56 year old right-handed gentleman with an underlying medical history of hypertension, obesity, hyperlipidemia, coronary artery disease, status post three-vessel CABG in 2009, paroxysmal A. fib, recent STEMI, status post drug-eluting stent placement and recent hospitalization in November 2017 for this, who presents for follow-up consultation of his obstructive sleep apnea, on AutoPap therapy. The patient is unaccompanied today. I first met him on 03/02/2016 at the request of his primary care physician, at which time he reported snoring and excessive daytime somnolence as well as witnessed apneas. I invited him for sleep study. His insurance denied an attended sleep study. He had a home sleep test on 04/08/2016 which showed an AHI of 50 per hour, average oxygen saturation was 90%, nadir was 67%, time below 88% saturation of 111 minutes, despite his medical history and his test results his insurance denied an in-house CPAP titration study and I placed him on AutoPap therapy.  He called back in February 2018 reporting difficulty tolerating AutoPap and struggling with the pressure settings. I suggested we switch him to a set pressure of 12 cm.  Today, 09/29/2016 (all dictated new, as well as above notes, some dictation done in note pad or Word, outside of chart, may appear as copied):  I reviewed his CPAP compliance data from 08/29/2016 through 09/27/2016 which is a total of 30 days, during which time he used his CPAP only 13 days and percent used days greater than 4 hours of only 13%, indicating poor compliance, average usage of 2 hours and 54 minutes only, residual AHI 1.6 per hour, leak for the 95th percentile at 8.5 L/m on a pressure of 12 cm. In the past 90 days he used his CPAP 70 days and was 50% compliant. He reports that he has nasal congestion, uses saline spray, breathe right strips. He  has tried multiple different interfaces, at one point he was fairly good with using a fullface mask from [employer], could be an [place] from his description but then he noticed a higher leak. There are some periods in the last 90 days where he had really good compliance. Overall, he has done reasonably well when he was able to use CPAP. He's willing to continue with treatments along he can tolerate it. He does not have any daytime nasal congestion, he does not have any telltale symptoms of allergies. He sleeps on his back. Of note, he was in [place] recently and did not take his machine with him, hence the lapse in treatment.   I reviewed his AutoPap compliance data from 05/04/2016 through 06/02/2016, which is a total of 30 days, during which time he used his AutoPap every night with percent used days greater than 4 hours at 77%, indicating good compliance with an average usage of 4 hours and 46 minutes, compliance data is not available after 06/02/2016. Average AHI mildly elevated at 8.4 per hour, 95th percentile pressure at 12.5 cm, leak on the high side with the 95th percentile at 22.6 L/m, range of 5-15 cm with EPR.   The patient's allergies, current medications, family history, past medical history, past social history, past surgical history and problem list were reviewed and updated as appropriate.   Previously (copied from previous notes for reference):   03/02/2016: He reports snoring and excessive daytime somnolence, As well as witnessed apneic pauses while asleep. I reviewed your office note  from 02/05/2016. Per wife, he has had breathing pauses while asleep, she noticed in the past few months. He does snore but more than the snoring she is worried about his apneic breathing pauses particularly with his recent cardiac stent placed and prior history of cardiac disease. He has had no recurrence of A. fib from what I understand had one episode in 2005. He exercises regularly and is trying to  lose weight. He tries to drink water. He does not drink caffeine on a regular basis since his cardiac stent placement. His Epworth sleepiness score is 11 out of 24, his fatigue score is 21 out of 63. Bedtime is generally around 11:30, wakeup time around 6:30. He does not watch TV in bed. They have pets but not in the bedroom. He does not endorse any significant morning headaches, has nocturia about once per night on average, denies any overt restless leg symptoms and is not known to twitch his legs are kick in his sleep. He does have a tendency to get sleepy after lunch. He works as a Music therapist. They have 2 children. He drinks alcohol a few times a week, not daily, quit smoking in 2004. He has one nephew with obstructive sleep apnea. He has a remote history of C6 fracture. He has a history of low testosterone but not currently on testosterone replacement.  His Past Medical History Is Significant For: Past Medical History:  Diagnosis Date  . Coronary artery disease    a. 2009: mutivessel CAD s/p CABG   b. 01/2016: inf STEMI s/p DES to RCA  . DDD (degenerative disc disease), cervical   . History of tobacco use    REMOTE  . Hyperlipidemia   . PAF (paroxysmal atrial fibrillation) ([place])    a. brief episode in 2005 wtih no recurrence. No indication for [place]  . Syncope and collapse     His Past Surgical History Is Significant For: Past Surgical History:  Procedure Laterality Date  . CARDIAC CATHETERIZATION N/A 02/24/2016   Procedure: Coronary Stent Intervention;  Surgeon: Sherren Mocha, MD;  Location: [place] CV LAB;  Service: Cardiovascular;  Laterality: N/A;  . CARDIAC CATHETERIZATION N/A 02/24/2016   Procedure: Left Heart Cath and Cors/Grafts Angiography;  Surgeon: Sherren Mocha, MD;  Location: [place] CV LAB;  Service: Cardiovascular;  Laterality: N/A;  . CORONARY ARTERY BYPASS GRAFT  02/06/08   X 3    His Family History Is Significant For: Family History  Problem Relation  Age of Onset  . Coronary artery disease Mother        ALSO ONE OF HIS UNCLES    His Social History Is Significant For: Social History   Social History  . Marital status: Married    Spouse name: N/A  . Number of children: N/A  . Years of education: N/A   Occupational History  . Financial advisor    Social History Main Topics  . Smoking status: Former Smoker    Packs/day: 0.50    Years: 20.00    Types: Cigarettes    Quit date: 01/07/2004  . Smokeless tobacco: Never Used     Comment: Stopped 32yr ago  . Alcohol use 1.5 oz/week    3 Standard drinks or equivalent per week     Comment: OCCASIONAL  . Drug use: No  . Sexual activity: Not Asked   Other Topics Concern  . None   Social History Narrative   MARRIED   TOBACCO USE ON AND OFF   ETOH OCCASIONALLY  USES HERBAL SUPP. ALONG WITH A DAILY VITAMIN PACK   HAD STRESS TEST APPROX 5 YRS   NEW ONSET AFIB   SYNCOPE    His Allergies Are:  Allergies  Allergen Reactions  . Contrast Media [Iodinated Diagnostic Agents] Other (See Comments)    Face turned scaly  . Iodine Solution [Povidone Iodine] Other (See Comments)    Face turned scaly  :   His Current Medications Are:  Outpatient Encounter Prescriptions as of 09/29/2016  Medication Sig  . aspirin EC 81 MG tablet Take 81 mg by mouth at bedtime.  Marland Kitchen ezetimibe (ZETIA) 10 MG tablet Take 1 tablet (10 mg total) by mouth daily.  Marland Kitchen L-Arginine POWD Take 1 scoop by mouth daily. Mix in water and drink  . metoprolol tartrate (LOPRESSOR) 25 MG tablet Take 0.5 tablets (12.5 mg total) by mouth 2 (two) times daily.  . nitroGLYCERIN (NITROSTAT) 0.4 MG SL tablet Place 1 tablet (0.4 mg total) under the tongue every 5 (five) minutes x 3 doses as needed for chest pain.  Marland Kitchen omega-3 acid ethyl esters (LOVAZA) 1 g capsule TAKE 1 CAPSULE BY MOUTH TWO TIMES DAILY  . pantoprazole (PROTONIX) 40 MG tablet Take 1 tablet (40 mg total) by mouth daily.  . rosuvastatin (CRESTOR) 20 MG tablet Take 1  tablet (20 mg total) by mouth daily. Please call and schedule a one year follow up appointment  . testosterone cypionate (DEPOTESTOSTERONE CYPIONATE) 200 MG/ML injection Inject 100 mg into the muscle every Tuesday.  . ticagrelor (BRILINTA) 90 MG TABS tablet Take 1 tablet (90 mg total) by mouth 2 (two) times daily.   Facility-Administered Encounter Medications as of 09/29/2016  Medication  . bivalirudin (ANGIOMAX) 250 mg in sodium chloride 0.9 % 50 mL (5 mg/mL) infusion  . bivalirudin (ANGIOMAX) BOLUS via infusion  . diphenhydrAMINE (BENADRYL) injection  . famotidine (PEPCID) IVPB 20 mg premix  . fentaNYL (SUBLIMAZE) injection  . heparin infusion 2 units/mL in 0.9 % sodium chloride  . heparin injection  . iopamidol (ISOVUE-370) 76 % injection  . lidocaine (PF) (XYLOCAINE) 1 % injection  . methylPREDNISolone sodium succinate (SOLU-MEDROL) 125 mg/2 mL injection  . midazolam (VERSED) injection  . ticagrelor (BRILINTA) tablet  :  Review of Systems:  Out of a complete 14 point review of systems, all are reviewed and negative with the exception of these symptoms as listed below:  Review of Systems  Neurological:       Pt presents today to discuss his cpap compliance. Pt recently traveled to [place] and did not bring his cpap with him. Pt does not like the full face mask and wants to stay with the nasal pillows.    Objective:  Neurological Exam  Physical Exam Physical Examination:   Vitals:   09/29/16 1307  BP: 119/81  Pulse: 64   General Examination: The patient is a very pleasant 56 y.o. male in no acute distress. He appears well-developed and well-nourished and well groomed.   HEENT: Normocephalic, atraumatic, pupils are equal, round and reactive to light and accommodation. Extraocular tracking is good without limitation to gaze excursion or nystagmus noted. Normal smooth pursuit is noted. Hearing is grossly intact. Face is symmetric with normal facial animation and normal facial  sensation. Speech is clear with no dysarthria noted. There is no hypophonia. There is no lip, neck/head, jaw or voice tremor. Neck is supple with full range of passive and active motion. There are no carotid bruits on auscultation. Oropharynx exam reveals: mild mouth dryness, good  dental hygiene and moderate airway crowding. Mallampati is class III. Tongue protrudes centrally and palate elevates symmetrically. Tonsils are absent. He has a Mild overbite.   Chest: Clear to auscultation without wheezing, rhonchi or crackles noted.  Heart: S1+S2+0, regular and normal without murmurs, rubs or gallops noted.   Abdomen: Soft, non-tender and non-distended with normal bowel sounds appreciated on auscultation.  Extremities: There is trace pitting edema in the distal lower extremities bilaterally. Pedal pulses are intact.  Skin: Warm and dry without trophic changes noted. There are no varicose veins.  Musculoskeletal: exam reveals no obvious joint deformities, tenderness or joint swelling or erythema.   Neurologically:  Mental status: The patient is awake, alert and oriented in all 4 spheres. His immediate and remote memory, attention, language skills and fund of knowledge are appropriate. There is no evidence of aphasia, agnosia, apraxia or anomia. Speech is clear with normal prosody and enunciation. Thought process is linear. Mood is normal and affect is normal.  Cranial nerves II - XII are as described above under HEENT exam. In addition: shoulder shrug is normal with equal shoulder height noted. Motor exam: Normal bulk, strength and tone is noted. There is no drift, tremor or rebound. Fine motor skills and coordination: grossly intact with normal finger taps, normal hand movements, normal rapid alternating patting, normal foot taps and normal foot agility.  Cerebellar testing: There is no truncal or gait ataxia.  Sensory exam: intact to light touch.  Gait, station and balance: He stands easily.  No veering to one side is noted. No leaning to one side is noted. Posture is age-appropriate and stance is narrow based. Gait shows normal stride length and normal pace. No problems turning are noted.   Assessment and Plan:  In summary, Jacob Gibbs is a very pleasant 56 year old male with an underlying medical history of hypertension, obesity, hyperlipidemia, coronary artery disease, status post three-vessel CABG in 2009, paroxysmal A. fib, recent STEMI, status post drug-eluting stent placement in November 2017, who presents for follow-up consultation of his severe sleep apnea as determined by a home sleep test in January 2018. His insurance denied a CPAP titration. I placed him on AutoPap therapy but he has difficulty tolerating the variable pressures. In addition, he has been struggling with nasal congestion and although he can tolerate the second pressure of CPAP of 12 cm he is still struggling with compliance secondary to nasal congestion, inability to tolerate one particular nasal or mask interface at the time. I suggested a couple of things today. I asked him to start using a nasal saline rinse system. I would like for him to see ENT for further help with nasal congestion and possibility of evaluation for surgical intervention in that regard, I would like for him to come back for a full night CPAP titration study to optimize mask fitting and tolerance of facet pressures. Given his significant cardiovascular history he should be on treatment for his severe OSA. He is willing to embark on this. I will see him back after his sleep study is completed. We have justification for bringing him back for an actual lab attended sleep study this time around. I answered all his questions today and he was in agreement.  I spent 25 minutes in total face-to-face time with the patient, more than 50% of which was spent in counseling and coordination of care, reviewing test results, reviewing medication and discussing  or reviewing the diagnosis of OSA, its prognosis and treatment options. Pertinent laboratory and  imaging test results that were available during this visit with the patient were reviewed by me and considered in my medical decision making (see chart for details).

## 2016-09-29 NOTE — Patient Instructions (Addendum)
Please continue using your CPAP regularly. While your insurance requires that you use CPAP at least 4 hours each night on 70% of the nights, I recommend, that you not skip any nights and use it throughout the night if you can. Getting used to CPAP and staying with the treatment long term does take time and patience and discipline. Untreated obstructive sleep apnea when it is moderate to severe can have an adverse impact on cardiovascular health and raise her risk for heart disease, arrhythmias, hypertension, congestive heart failure, stroke and diabetes. Untreated obstructive sleep apnea causes sleep disruption, nonrestorative sleep, and sleep deprivation. This can have an impact on your day to day functioning and cause daytime sleepiness and impairment of cognitive function, memory loss, mood disturbance, and problems focussing. Using CPAP regularly can improve these symptoms.   Please look into using a salt water nasal rinse system, such as the EchoStareti Pot and follow the directions and my instructions. It may help your nasal congestion and help you tolerate your CPAP.   I will refer you to ENT to get additional help with your nasal congestion.

## 2016-10-28 ENCOUNTER — Emergency Department (HOSPITAL_COMMUNITY): Payer: 59

## 2016-10-28 ENCOUNTER — Telehealth: Payer: Self-pay | Admitting: Cardiovascular Disease

## 2016-10-28 ENCOUNTER — Encounter (HOSPITAL_COMMUNITY): Payer: Self-pay | Admitting: Emergency Medicine

## 2016-10-28 ENCOUNTER — Emergency Department (HOSPITAL_COMMUNITY)
Admission: EM | Admit: 2016-10-28 | Discharge: 2016-10-28 | Disposition: A | Discharge status: Home / Self Care | Payer: 59 | Attending: Emergency Medicine | Admitting: Emergency Medicine

## 2016-10-28 DIAGNOSIS — Z7982 Long term (current) use of aspirin: Secondary | ICD-10-CM | POA: Diagnosis not present

## 2016-10-28 DIAGNOSIS — I2119 ST elevation (STEMI) myocardial infarction involving other coronary artery of inferior wall: Secondary | ICD-10-CM | POA: Insufficient documentation

## 2016-10-28 DIAGNOSIS — I2510 Atherosclerotic heart disease of native coronary artery without angina pectoris: Secondary | ICD-10-CM | POA: Insufficient documentation

## 2016-10-28 DIAGNOSIS — R079 Chest pain, unspecified: Secondary | ICD-10-CM | POA: Diagnosis not present

## 2016-10-28 DIAGNOSIS — M25511 Pain in right shoulder: Secondary | ICD-10-CM | POA: Diagnosis not present

## 2016-10-28 DIAGNOSIS — R072 Precordial pain: Secondary | ICD-10-CM | POA: Insufficient documentation

## 2016-10-28 DIAGNOSIS — Z87891 Personal history of nicotine dependence: Secondary | ICD-10-CM | POA: Insufficient documentation

## 2016-10-28 DIAGNOSIS — Z79899 Other long term (current) drug therapy: Secondary | ICD-10-CM | POA: Diagnosis not present

## 2016-10-28 DIAGNOSIS — G4733 Obstructive sleep apnea (adult) (pediatric): Secondary | ICD-10-CM | POA: Diagnosis not present

## 2016-10-28 DIAGNOSIS — Z951 Presence of aortocoronary bypass graft: Secondary | ICD-10-CM | POA: Diagnosis not present

## 2016-10-28 LAB — BASIC METABOLIC PANEL
Anion gap: 7 (ref 5–15)
BUN: 16 mg/dL (ref 6–20)
CALCIUM: 8.8 mg/dL — AB (ref 8.9–10.3)
CO2: 25 mmol/L (ref 22–32)
CREATININE: 1.12 mg/dL (ref 0.61–1.24)
Chloride: 106 mmol/L (ref 101–111)
GFR calc Af Amer: >60 mL/min (ref >60)
GLUCOSE: 98 mg/dL (ref 65–99)
Potassium: 4.3 mmol/L (ref 3.5–5.1)
Sodium: 138 mmol/L (ref 135–145)

## 2016-10-28 LAB — I-STAT TROPONIN, ED
TROPONIN I, POC: 0 ng/mL (ref 0.00–0.08)
TROPONIN I, POC: 0.01 ng/mL (ref 0.00–0.08)

## 2016-10-28 LAB — CBC
HCT: 51.1 % (ref 39.0–52.0)
Hemoglobin: 17.5 g/dL — ABNORMAL HIGH (ref 13.0–17.0)
MCH: 30.4 pg (ref 26.0–34.0)
MCHC: 34.2 g/dL (ref 30.0–36.0)
MCV: 88.7 fL (ref 78.0–100.0)
PLATELETS: 190 10*3/uL (ref 150–400)
RBC: 5.76 MIL/uL (ref 4.22–5.81)
RDW: 13.1 % (ref 11.5–15.5)
WBC: 13 10*3/uL — AB (ref 4.0–10.5)

## 2016-10-28 LAB — BRAIN NATRIURETIC PEPTIDE: B Natriuretic Peptide: 24.6 pg/mL (ref 0.0–100.0)

## 2016-10-28 LAB — D-DIMER, QUANTITATIVE (NOT AT ARMC): D DIMER QUANT: 0.36 ug{FEU}/mL (ref 0.00–0.50)

## 2016-10-28 MED ORDER — GI COCKTAIL ~~LOC~~
30.0000 mL | Freq: Once | ORAL | Status: AC
  Administered 2016-10-28: 30 mL via ORAL
  Filled 2016-10-28: qty 30

## 2016-10-28 MED ORDER — NITROGLYCERIN 0.4 MG SL SUBL
0.4000 mg | SUBLINGUAL_TABLET | SUBLINGUAL | Status: DC | PRN
  Administered 2016-10-28: 0.4 mg via SUBLINGUAL
  Filled 2016-10-28: qty 1

## 2016-10-28 MED ORDER — ACETAMINOPHEN 325 MG PO TABS
650.0000 mg | ORAL_TABLET | Freq: Once | ORAL | Status: AC
  Administered 2016-10-28: 650 mg via ORAL
  Filled 2016-10-28: qty 2

## 2016-10-28 MED ORDER — ASPIRIN EC 81 MG PO TBEC
81.0000 mg | DELAYED_RELEASE_TABLET | Freq: Once | ORAL | Status: AC
  Administered 2016-10-28: 81 mg via ORAL
  Filled 2016-10-28: qty 1

## 2016-10-28 NOTE — ED Provider Notes (Signed)
[facility]-EMERGENCY DEPT Provider Note   CSN: [id number] Arrival date & time: 10/28/16  [id number]     History   Chief Complaint Chief Complaint  Patient presents with  . Chest Pain    HPI Jacob Gibbs is a 56 y.o. male with h/o paroxysmal atrial fibrillation, HLD, CAD s/p 3V CABG in 2009 with Dr. Cornelius Moraswen and drug eluting stent s/p inferior STEMI in 2017 presents to ED for evaluation of new right shoulder and trapezius discomfort that radiates to right neck and head since 4 am today, constant, worse with movement and taking a deep breath. 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 He does not think this chest soreness is cardiac, does not feel like previous heart attack. Pt states he had increased indigestion, gas and belching last night. He thought it was acid reflux, took his PPI which slightly improved chest discomfort.   Denies h/o fevers, recent URI illness, cough, SOB, dyspnea, palpitations, diaphoresis, nausea, vomiting, abdominal pain. Has nitroglycerin at home but did not take it. Denies exertional CP or SOB, plays golf and uses elliptical without problems.   HPI  Past Medical History:  Diagnosis Date  . Coronary artery disease    a. 2009: mutivessel CAD s/p CABG   b. 01/2016: inf STEMI s/p DES to RCA  . DDD (degenerative disc disease), cervical   . History of tobacco use    REMOTE  . Hyperlipidemia   . PAF (paroxysmal atrial fibrillation) (HCC)    a. brief episode in 2005 wtih no recurrence. No indication for OAC  . Syncope and collapse     Patient Active Problem List   Diagnosis Date Noted  . Coronary artery disease   . PAF (paroxysmal atrial fibrillation) (HCC)   . Acute ST elevation myocardial infarction (STEMI) of inferior wall (HCC) 02/24/2016  . HLD (hyperlipidemia) 08/19/2010    Past Surgical History:  Procedure Laterality Date  . CARDIAC  CATHETERIZATION N/A 02/24/2016   Procedure: Coronary Stent Intervention;  Surgeon: Tonny BollmanMichael Cooper, MD;  [facility];  Service: Cardiovascular;  Laterality: N/A;  . CARDIAC CATHETERIZATION N/A 02/24/2016   Procedure: Left Heart Cath and Cors/Grafts Angiography;  Surgeon: Tonny BollmanMichael Cooper, MD;  [facility];  Service: Cardiovascular;  Laterality: N/A;  . CORONARY ARTERY BYPASS GRAFT  02/06/08   X 3       Home Medications    Prior to Admission medications   Medication Sig Start Date End Date Taking? Authorizing Provider  aspirin EC 81 MG tablet Take 81 mg by mouth at bedtime.   Yes [provider]  ezetimibe (ZETIA) 10 MG tablet Take 1 tablet (10 mg total) by mouth daily. 03/03/16  Yes Janetta Horahompson, Kathryn R, PA-C  metoprolol tartrate (LOPRESSOR) 25 MG tablet Take 0.5 tablets (12.5 mg total) by mouth 2 (two) times daily. 02/25/16  Yes Janetta Horahompson, Kathryn R, PA-C  nitroGLYCERIN (NITROSTAT) 0.4 MG SL tablet Place 1 tablet (0.4 mg total) under the tongue every 5 (five) minutes x 3 doses as needed for chest pain. 02/25/16  Yes Cline Crockhompson, Kathryn R, PA-C  omega-3 acid ethyl esters (LOVAZA) 1 g capsule TAKE 1 CAPSULE BY MOUTH TWO TIMES DAILY 07/28/16  Yes Kathleene HazelMcAlhany, Christopher D, MD  pantoprazole (PROTONIX) 40 MG tablet Take 1 tablet (40 mg total) by mouth daily. Patient taking differently: Take 40 mg by mouth daily as needed (for acid reflux).  03/05/16  Yes Kathleene Hazel, MD  rosuvastatin (CRESTOR) 20 MG tablet Take 1 tablet (20 mg total) by mouth daily. Please call and schedule a one year follow up appointment Patient taking differently: Take 10 mg by mouth daily. Please call and schedule a one year follow up appointment 02/25/16  Yes Janetta Hora, PA-C  testosterone cypionate (DEPOTESTOSTERONE CYPIONATE) 200 MG/ML injection Inject 100 mg into the muscle every Tuesday.   Yes [provider]  ticagrelor (BRILINTA) 90 MG TABS tablet Take 1  tablet (90 mg total) by mouth 2 (two) times daily. 02/25/16  Yes Janetta Hora, PA-C    Family History Family History  Problem Relation Age of Onset  . Coronary artery disease Mother        ALSO ONE OF HIS UNCLES    Social History Social History  Substance Use Topics  . Smoking status: Former Smoker    Packs/day: 0.50    Years: 20.00    Types: Cigarettes    Quit date: 01/07/2004  . Smokeless tobacco: Never Used     Comment: Stopped 29yrs ago  . Alcohol use 1.5 oz/week    3 Standard drinks or equivalent per week     Comment: OCCASIONAL     Allergies   Contrast media [iodinated diagnostic agents] and Iodine solution [povidone iodine]   Review of Systems Review of Systems  Constitutional: Negative for fever.  HENT: Negative for congestion and sore throat.   Respiratory: Negative for cough, chest tightness and shortness of breath.   Cardiovascular: Positive for chest pain.  Gastrointestinal: Negative for abdominal pain, diarrhea, nausea and vomiting.  Genitourinary: Negative for difficulty urinating, dysuria and hematuria.  Musculoskeletal: Negative for back pain and neck pain.       +Right shoulder/trap pain  Neurological: Negative for headaches.     Physical Exam Updated Vital Signs BP 119/83 (BP Location: Left Arm)   Pulse 66   Temp 98.2 F (36.8 C) (Oral)   Resp 17   Ht 5\' 9"  (1.753 m)   Wt 99.8 kg (220 lb)   SpO2 94%   BMI 32.49 kg/m   Physical Exam  Constitutional: He is oriented to person, place, and time. He appears well-developed and well-nourished. No distress.  HENT:  Head: Normocephalic and atraumatic.  Nose: Nose normal.  Mouth/Throat: Oropharynx is clear and moist. No oropharyngeal exudate.  Eyes: Pupils are equal, round, and reactive to light. Conjunctivae and EOM are normal.  Neck: Normal range of motion. Neck supple.  Trachea midline  Cardiovascular: Normal rate, regular rhythm, normal heart sounds and intact distal pulses.   No  murmur heard. No LE edema. No S3.   Pulmonary/Chest: Effort normal and breath sounds normal. No respiratory distress. He has no wheezes. He has no rales. He exhibits tenderness.  Upper, sternal tenderness  Abdominal: Soft. Bowel sounds are normal. He exhibits no distension. There is no tenderness.  Musculoskeletal: Normal range of motion. He exhibits no deformity.  Right shoulder/trap non tender, ROM of RUE does not cause pain  Lymphadenopathy:    He has no cervical adenopathy.  Neurological: He is alert and oriented to person, place, and time.  Skin: Skin is warm and dry. Capillary refill takes less than 2 seconds.  Psychiatric: He has a normal mood and affect. His behavior is normal. Judgment and thought content normal.  Nursing note and vitals reviewed.    ED Treatments / Results  Labs (all labs ordered are listed, but only abnormal results are  displayed) Labs Reviewed  BASIC METABOLIC PANEL - Abnormal; Notable for the following:       Result Value   Calcium 8.8 (*)    All other components within normal limits  CBC - Abnormal; Notable for the following:    WBC 13.0 (*)    Hemoglobin 17.5 (*)    All other components within normal limits  BRAIN NATRIURETIC PEPTIDE  D-DIMER, QUANTITATIVE (NOT AT [facility])  I-STAT TROPONIN, ED  I-STAT TROPONIN, ED    EKG  EKG Interpretation None       Radiology Dg Chest 2 View  Result Date: 10/28/2016 CLINICAL DATA:  Chest pain. EXAM: CHEST  2 VIEW COMPARISON:  03/05/2008 . FINDINGS: Prior CABG. Heart size stable. Mild basilar atelectasis. No pleural effusion or pneumothorax. No acute bony abnormality . IMPRESSION: 1. Prior CABG.  Heart size stable. 2.  Low lung volumes with bibasilar atelectasis . Electronically Signed   By: Maisie Fus  Register   On: 10/28/2016 07:19    Procedures Procedures (including critical care time)  Medications Ordered in ED Medications  nitroGLYCERIN (NITROSTAT) SL tablet 0.4 mg (0.4 mg Sublingual Given 10/28/16  1040)  aspirin EC tablet 81 mg (81 mg Oral Given 10/28/16 0741)  gi cocktail (Maalox,Lidocaine,Donnatal) (30 mLs Oral Given 10/28/16 0742)  acetaminophen (TYLENOL) tablet 650 mg (650 mg Oral Given 10/28/16 0740)     Initial Impression / Assessment and Plan / ED Course  I have reviewed the triage vital signs and the nursing notes.  Pertinent labs & imaging results that were available during my care of the patient were reviewed by me and considered in my medical decision making (see chart for details).  Clinical Course as of Oct 28 1105  Wed Oct 28, 2016  0910 Re-evaluated patient; reports right shoulder/trap pain and chest discomfort slightly improved after nitro, tylenol, GI cocktail but still there  [CG]  1017 Spoke to Dr. Eldridge Dace who is agreeable with delta troponin, discharge if normal w/ cardiology f/u.   [CG]  1047 Pt was given 0.4 mg nitro SL, no change in chest discomfort or right shoulder discomfort 5 min after nitro  [CG]    Clinical Course User Index [CG] Liberty Handy, PA-C   56 yo male with h/o paroxysmal atrial fibrillation, HLD, CAD s/p 3V CABG, drug eluting stent s/p inferior STEMI in 2017 presents to ED for evaluation of reproducible sternal/chest discomfort and right shoulder/trap pleuritic pain. Non exertional. Not associated with SOB, dyspnea, palpitations, nausea, vomiting, diaphoresis, light-headedness. H/o acid reflux (non compliant with daily PPI) and sternal arthritis.  Pt states his sternal arthritis causes him similar and occasional chest wall discomfort, often reproducible. Cardiopulmonary exam is reassuring today, he still endorses right shoulder and chest wall discomfort in ED. HEART score =3.  Lab work reassuring including CBC, BMP, troponin x 2, BNP and d-dimer. EKG without STEMI. CXR normal. BP, HR and rhythm on monitor have remained normal and stable in ED. Pt received tylenol and GI cocktail in ED which slightly improved symptoms. Nitroglycerin x 1 did not  help chest or shoulder discomfort.   Final Clinical Impressions(s) / ED Diagnoses   Final diagnoses:  Precordial pain  Acute pain of right shoulder   Patient has ambulated and tolerated PO in ED. Given symptoms, reassuring ED work up, HEART score patient will be discharged with recommendation to follow up with PCP and cardiologist in regards to today's hospital visit. ED return preacutions given. Pt appears reliable for follow up and is agreeable to  discharge, discussed s/s that would warrant return to ED. D[facility] pt [facility] Dr Eldridge DaceVaranasi who agrees with ED work up and disposition.   Patient, ED treatment and discharge plan was discussed with supervising physician who also evaluated the patient and is agreeable with plan.   New Prescriptions New Prescriptions   No medications on file     Jerrell MylarGibbons, Andreea Arca J, PA-C 10/28/16 1107    Zadie RhineWickline, Donald, MD 10/29/16 [phone]

## 2016-10-28 NOTE — Discharge Instructions (Signed)
You presented to the emergency department for evaluation of central sternal discomfort and right shoulder and trapezius discomfort. You denied cough, shortness of breath, nausea, vomiting, exertional chest pain or shortness of breath. Your lab work including heart enzymes, chest x-ray, EKG were all reassuring today. There is less than a 2% chance that you have a pulmonary embolism. After discussion with Dr. Eldridge DaceVaranasi, you are considered safe for discharge at this time with urgent follow-up with your cardiologist as soon as possible. Please call your cardiology office today and make an appointment for further evaluation and discussion of your symptoms in ED visit today. Monitor your symptoms. Return to the emergency department if your symptoms become exertional, worse then or you develop shortness of breath, nausea, vomiting, sweating, lightheadedness or palpitations. Please read attached information on pulmonary embolism, so you are aware of symptoms that would warrant return to ED.

## 2016-10-28 NOTE — Telephone Encounter (Signed)
New message  Pt call requesting to speak with RN about his recent hospital visit. Please call back to discuss.

## 2016-10-28 NOTE — ED Notes (Signed)
ED Provider at bedside. 

## 2016-10-28 NOTE — ED Provider Notes (Signed)
Patient seen/examined in the Emergency Department in conjunction with Midlevel Provider Margarette AsalGibbons Patient reports pleuritic CP that is new as well as left shoulder pain.  He was concerned as he has h/o MI Exam : awake/alert, no distress, no loud murmurs noted, lungs clear Plan: will need evaluation for PE,  If negative consult cardiology due to h/o CAD     Zadie RhineWickline, Kalden Wanke, MD 10/28/16 [phone]

## 2016-10-28 NOTE — ED Provider Notes (Signed)
ED ECG REPORT   Date: 10/28/2016 [id number]  Rate: 73  Rhythm: normal sinus rhythm  QRS Axis: normal  Intervals: normal  ST/T Wave abnormalities: nonspecific ST changes  Conduction Disutrbances:none  Narrative Interpretation:   I have personally reviewed the EKG tracing and agree with the computerized printout as noted.    Zadie RhineWickline, Yamaris Cummings, MD 10/28/16 0700

## 2016-10-28 NOTE — Telephone Encounter (Signed)
I spoke with pt.  He was in ED earlier today with shoulder pain and possible reflux symptoms.  States all testing  was OK. He reports feeling fine at current time. Discharge instruction note indicates pt should follow up with Dr. Clifton JamesMcAlhany.  I scheduled him to see Dr. Clifton JamesMcAlhany on 11/11/16 at 4:00.  He is also going to follow up with primary care regarding possible referral to ortho.

## 2016-10-28 NOTE — ED Notes (Signed)
Patient transported to X-ray 

## 2016-10-28 NOTE — ED Triage Notes (Signed)
Pt presents with aching/sharp central chest pain that started yesterday. Pt reports that yesterday the pain increased with inhalation and felt like acid reflux. Pt reports the pain in his rt shoulder woke him up at 4am this morning, radiating from rt upper back/ rt shoulder to rt neck. Pt denies N/V/D and sob. Pt has hx of CABG in 2009 and stent placed 2017.

## 2016-11-04 DIAGNOSIS — D72829 Elevated white blood cell count, unspecified: Secondary | ICD-10-CM | POA: Diagnosis not present

## 2016-11-04 DIAGNOSIS — I25810 Atherosclerosis of coronary artery bypass graft(s) without angina pectoris: Secondary | ICD-10-CM | POA: Diagnosis not present

## 2016-11-04 DIAGNOSIS — R0789 Other chest pain: Secondary | ICD-10-CM | POA: Diagnosis not present

## 2016-11-09 DIAGNOSIS — E784 Other hyperlipidemia: Secondary | ICD-10-CM | POA: Diagnosis not present

## 2016-11-11 ENCOUNTER — Encounter: Payer: Self-pay | Admitting: Cardiovascular Disease

## 2016-11-11 ENCOUNTER — Ambulatory Visit (INDEPENDENT_AMBULATORY_CARE_PROVIDER_SITE_OTHER): Payer: 59 | Admitting: Cardiovascular Disease

## 2016-11-11 VITALS — BP 110/74 | HR 64 | Ht 69.0 in | Wt 222.4 lb

## 2016-11-11 DIAGNOSIS — I2510 Atherosclerotic heart disease of native coronary artery without angina pectoris: Secondary | ICD-10-CM

## 2016-11-11 DIAGNOSIS — I480 Paroxysmal atrial fibrillation: Secondary | ICD-10-CM

## 2016-11-11 DIAGNOSIS — E7800 Pure hypercholesterolemia, unspecified: Secondary | ICD-10-CM | POA: Diagnosis not present

## 2016-11-11 MED ORDER — CLOPIDOGREL BISULFATE 75 MG PO TABS: 75.0000 mg | ORAL_TABLET | Freq: Every day | ORAL | 3 refills | Status: DC

## 2016-11-11 NOTE — Patient Instructions (Signed)
Medication Instructions:  Your physician has recommended you make the following change in your medication:  Finish current supply of Brilinta and then start Clopidogrel 75 mg by mouth daily   Labwork: none  Testing/Procedures: none  Follow-Up: Your physician recommends that you schedule a follow-up appointment in: 6 months. Please call our office in about 3 months to schedule this appointment.    Any Other Special Instructions Will Be Listed Below (If Applicable).     If you need a refill on your cardiac medications before your next appointment, please call your pharmacy.

## 2016-11-11 NOTE — Progress Notes (Signed)
Chief Complaint  Patient presents with  . Follow-up    CAD     History of Present Illness: 56 yo male with history of paroxysmal atrial fibrillation, HLD and CAD s/p 3V CABG in 2009 here today for cardiac follow up. He underwent 3V CABG in 2009 (RIMA to LAD, LIMA to OM3, SVG to RCA). Exercise stress test November 2015 with no ischemia. Echo December 2015 with normal LV size and function, mild valve disease. He was admitted to [facility] November 2017 via EMS with c/o left shoulder pain. Code STEMI called. Emergent cardiac cath with severe diffuse disease in the SVG to PDA. A drug eluting stent was placed in the native RCA. LVEF by LV gram 55% with subtle hypokinesis of the inferior wall. Troponin negative.   He is here today for follow up. The patient denies any chest pain, dyspnea, palpitations, lower extremity edema, orthopnea, PND, dizziness, near syncope or syncope.   Primary Care Physician: Martha Clan, MD  Past Medical History:  Diagnosis Date  . Coronary artery disease    a. 2009: mutivessel CAD s/p CABG   b. 01/2016: inf STEMI s/p DES to RCA  . DDD (degenerative disc disease), cervical   . History of tobacco use    REMOTE  . Hyperlipidemia   . PAF (paroxysmal atrial fibrillation) (HCC)    a. brief episode in 2005 wtih no recurrence. No indication for OAC  . Syncope and collapse     Past Surgical History:  Procedure Laterality Date  . CARDIAC CATHETERIZATION N/A 02/24/2016   Procedure: Coronary Stent Intervention;  Surgeon: Tonny Bollman, MD;  Location: [facility] INVASIVE CV LAB;  Service: Cardiovascular;  Laterality: N/A;  . CARDIAC CATHETERIZATION N/A 02/24/2016   Procedure: Left Heart Cath and Cors/Grafts Angiography;  Surgeon: Tonny Bollman, MD;  Location: [facility] INVASIVE CV LAB;  Service: Cardiovascular;  Laterality: N/A;  . CORONARY ARTERY BYPASS GRAFT  02/06/08   X 3    Current Outpatient Prescriptions  Medication Sig Dispense Refill  . aspirin EC 81 MG tablet Take 81 mg by  mouth at bedtime.    Marland Kitchen ezetimibe (ZETIA) 10 MG tablet Take 1 tablet (10 mg total) by mouth daily. 90 tablet 3  . metoprolol tartrate (LOPRESSOR) 25 MG tablet Take 0.5 tablets (12.5 mg total) by mouth 2 (two) times daily. 60 tablet 0  . nitroGLYCERIN (NITROSTAT) 0.4 MG SL tablet Place 1 tablet (0.4 mg total) under the tongue every 5 (five) minutes x 3 doses as needed for chest pain. 25 tablet 0  . omega-3 acid ethyl esters (LOVAZA) 1 g capsule TAKE 1 CAPSULE BY MOUTH TWO TIMES DAILY 180 capsule 3  . pantoprazole (PROTONIX) 40 MG tablet Take 1 tablet (40 mg total) by mouth daily. (Patient taking differently: Take 40 mg by mouth daily as needed (for acid reflux). ) 90 tablet 3  . rosuvastatin (CRESTOR) 20 MG tablet Take 1 tablet (20 mg total) by mouth daily. Please call and schedule a one year follow up appointment (Patient taking differently: Take 10 mg by mouth daily. Please call and schedule a one year follow up appointment) 30 tablet 0  . testosterone cypionate (DEPOTESTOSTERONE CYPIONATE) 200 MG/ML injection Inject 100 mg into the muscle every Tuesday.    . clopidogrel (PLAVIX) 75 MG tablet Take 1 tablet (75 mg total) by mouth daily. 90 tablet 3   No current facility-administered medications for this visit.    Facility-Administered Medications Ordered in Other Visits  Medication Dose Route Frequency Provider Last  Rate Last Dose  . bivalirudin (ANGIOMAX) 250 mg in sodium chloride 0.9 % 50 mL (5 mg/mL) infusion    Continuous PRN Tonny Bollmanooper, Michael, MD   Stopped at 02/24/16 1511  . bivalirudin (ANGIOMAX) BOLUS via infusion    PRN Tonny Bollmanooper, Michael, MD   78.75 mg at 02/24/16 1428  . diphenhydrAMINE (BENADRYL) injection    PRN Tonny Bollmanooper, Michael, MD   50 mg at 02/24/16 1414  . famotidine (PEPCID) IVPB 20 mg premix    PRN Tonny Bollmanooper, Michael, MD   20 mg at 02/24/16 1413  . fentaNYL (SUBLIMAZE) injection    PRN Tonny Bollmanooper, Michael, MD   25 mcg at 02/24/16 1505  . heparin infusion 2 units/mL in 0.9 % sodium chloride     Continuous PRN Tonny Bollmanooper, Michael, MD   1,000 mL at 02/24/16 1526  . heparin injection    PRN Tonny Bollmanooper, Michael, MD   4,000 Units at 02/24/16 1417  . iopamidol (ISOVUE-370) 76 % injection    PRN Tonny Bollmanooper, Michael, MD   190 mL at 02/24/16 1525  . lidocaine (PF) (XYLOCAINE) 1 % injection    PRN Tonny Bollmanooper, Michael, MD   20 mL at 02/24/16 1419  . methylPREDNISolone sodium succinate (SOLU-MEDROL) 125 mg/2 mL injection    PRN Tonny Bollmanooper, Michael, MD   125 mg at 02/24/16 1414  . midazolam (VERSED) injection    PRN Tonny Bollmanooper, Michael, MD   1 mg at 02/24/16 1505  . ticagrelor (BRILINTA) tablet    PRN Tonny Bollmanooper, Michael, MD   180 mg at 02/24/16 1435    Allergies  Allergen Reactions  . Contrast Media [Iodinated Diagnostic Agents] Other (See Comments)    Face turned scaly  . Iodine Solution [Povidone Iodine] Other (See Comments)    Face turned scaly    Social History   Social History  . Marital status: Married    Spouse name: N/A  . Number of children: N/A  . Years of education: N/A   Occupational History  . Financial advisor    Social History Main Topics  . Smoking status: Former Smoker    Packs/day: 0.50    Years: 20.00    Types: Cigarettes    Quit date: 01/07/2004  . Smokeless tobacco: Never Used     Comment: Stopped 2988yrs ago  . Alcohol use 1.5 oz/week    3 Standard drinks or equivalent per week     Comment: OCCASIONAL  . Drug use: No  . Sexual activity: Not on file   Other Topics Concern  . Not on file   Social History Narrative   MARRIED   TOBACCO USE ON AND OFF   ETOH OCCASIONALLY   USES HERBAL SUPP. ALONG WITH A DAILY VITAMIN PACK   HAD STRESS TEST APPROX 5 YRS   NEW ONSET AFIB   SYNCOPE    Family History  Problem Relation Age of Onset  . Coronary artery disease Mother        ALSO ONE OF HIS UNCLES    Review of Systems:  As stated in the HPI and otherwise negative.   BP 110/74   Pulse 64   Ht 5\' 9"  (1.753 m)   Wt 222 lb 6.4 oz (100.9 kg)   SpO2 96%   BMI 32.84 kg/m    Physical Examination:  General: Well developed, well nourished, NAD  HEENT: OP clear, mucus membranes moist  SKIN: warm, dry. No rashes. Neuro: No focal deficits  Musculoskeletal: Muscle strength 5/5 all ext  Psychiatric: Mood and affect normal  Neck: No JVD, no carotid bruits, no thyromegaly, no lymphadenopathy.  Lungs:Clear bilaterally, no wheezes, rhonci, crackles Cardiovascular: Regular rate and rhythm. No murmurs, gallops or rubs. Abdomen:Soft. Bowel sounds present. Non-tender.  Extremities: No lower extremity edema. Pulses are 2 + in the bilateral DP/PT.  Echo 03/12/14: Left ventricle: The cavity size was normal. Wall thickness was increased in a pattern of mild LVH. Systolic function was normal. The estimated ejection fraction was in the range of 55% to 60%. Although no diagnostic regional wall motion abnormality was identified, this possibility cannot be completely excluded on the basis of this study. Diastolic function was not fully assessed. - Aortic valve: There was no stenosis. - Aorta: Mildly dilated aortic root. Aortic root dimension: 37 mm (ED). - Mitral valve: Mildly calcified annulus. Mildly calcified leaflets . There was trivial regurgitation. - Tricuspid valve: Peak RV-RA gradient (S): 24 mm Hg. - Pulmonary arteries: PA peak pressure: 27 mm Hg (S). - Inferior vena cava: The vessel was normal in size. The respirophasic diameter changes were in the normal range (= 50%), consistent with normal central venous pressure.  Impressions:  - Normal LV size with mild LV hypertrophy. EF 55-60%. Normal RV size and systolic function. No significant valvular abnormalities.   EKG:  EKG is not  ordered today. The ekg ordered today demonstrates   Recent Labs: 04/27/2016: ALT 38 10/28/2016: B Natriuretic Peptide 24.6; BUN 16; Creatinine, Ser 1.12; Hemoglobin 17.5; Platelets 190; Potassium 4.3; Sodium 138   Lipid Panel    Component Value Date/Time    CHOL 102 04/27/2016 0907   TRIG 86 04/27/2016 0907   HDL 32 (L) 04/27/2016 0907   CHOLHDL 3.2 04/27/2016 0907   CHOLHDL 4.1 02/25/2016 0515   VLDL 13 02/25/2016 0515   LDLCALC 53 04/27/2016 0907     Wt Readings from Last 3 Encounters:  11/11/16 222 lb 6.4 oz (100.9 kg)  10/28/16 220 lb (99.8 kg)  09/29/16 224 lb (101.6 kg)     Other studies Reviewed: Additional studies/ records that were reviewed today include: . Review of the above records demonstrates:    Assessment and Plan:   1. CAD s/p CABG without angina: No chest pain suggestive of angina. He had an inferior STEMI in November 2017 and had a drug eluting stent placed in the native RCA. Will continue ASA and will change Brilinta to Plavix. Continue beta blocker and statin.    2. Paroxysmal atrial fibrillation: He is in sinus today. Remote atrial fib. No indication for anti-coagulation.    3. HLD: LDL at goal (LDL 55 on 11/09/16 in primary care). Continue Crestor and Zetia.    Current medicines are reviewed at length with the patient today.  The patient does not have concerns regarding medicines.  The following changes have been made:  no change  Labs/ tests ordered today include:   No orders of the defined types were placed in this encounter.  Disposition:   FU with me in 6  months   Signed, Verne Carrow, MD 11/11/2016 4:58 PM    [facility] [address] [place], [place], [place]  [address] Phone: [phone]; Fax: [phone]

## 2017-01-25 DIAGNOSIS — Z125 Encounter for screening for malignant neoplasm of prostate: Secondary | ICD-10-CM | POA: Diagnosis not present

## 2017-01-25 DIAGNOSIS — E291 Testicular hypofunction: Secondary | ICD-10-CM | POA: Diagnosis not present

## 2017-02-01 DIAGNOSIS — E291 Testicular hypofunction: Secondary | ICD-10-CM | POA: Diagnosis not present

## 2017-02-05 DIAGNOSIS — E7849 Other hyperlipidemia: Secondary | ICD-10-CM | POA: Diagnosis not present

## 2017-02-05 DIAGNOSIS — Z0000 Encounter for general adult medical examination without abnormal findings: Secondary | ICD-10-CM | POA: Diagnosis not present

## 2017-02-05 DIAGNOSIS — Z125 Encounter for screening for malignant neoplasm of prostate: Secondary | ICD-10-CM | POA: Diagnosis not present

## 2017-02-11 DIAGNOSIS — Z23 Encounter for immunization: Secondary | ICD-10-CM | POA: Diagnosis not present

## 2017-02-11 DIAGNOSIS — Z0000 Encounter for general adult medical examination without abnormal findings: Secondary | ICD-10-CM | POA: Diagnosis not present

## 2017-02-11 DIAGNOSIS — E7849 Other hyperlipidemia: Secondary | ICD-10-CM | POA: Diagnosis not present

## 2017-02-11 DIAGNOSIS — R7301 Impaired fasting glucose: Secondary | ICD-10-CM | POA: Diagnosis not present

## 2017-02-15 ENCOUNTER — Other Ambulatory Visit: Payer: Self-pay

## 2017-02-15 MED ORDER — NITROGLYCERIN 0.4 MG SL SUBL: 0.4000 mg | SUBLINGUAL_TABLET | SUBLINGUAL | 5 refills | Status: DC | PRN

## 2017-02-15 MED ORDER — EZETIMIBE 10 MG PO TABS: 10.0000 mg | ORAL_TABLET | Freq: Every day | ORAL | 3 refills | Status: DC

## 2017-02-15 MED ORDER — CLOPIDOGREL BISULFATE 75 MG PO TABS: 75.0000 mg | ORAL_TABLET | Freq: Every day | ORAL | 3 refills | Status: DC

## 2017-02-15 MED ORDER — PANTOPRAZOLE SODIUM 40 MG PO TBEC: 40.0000 mg | DELAYED_RELEASE_TABLET | Freq: Every day | ORAL | 3 refills | Status: DC

## 2017-02-15 MED ORDER — ROSUVASTATIN CALCIUM 20 MG PO TABS: 10.0000 mg | ORAL_TABLET | Freq: Every day | ORAL | Status: DC

## 2017-02-15 MED ORDER — METOPROLOL TARTRATE 25 MG PO TABS: 12.5000 mg | ORAL_TABLET | Freq: Two times a day (BID) | ORAL | 3 refills | Status: DC

## 2017-02-16 ENCOUNTER — Telehealth: Payer: Self-pay

## 2017-02-16 NOTE — Telephone Encounter (Signed)
**Note De-Identified Chrissi Crow Obfuscation** I have done a Zetia PA through covermymeds and received an immediate response that the PA is approved: Coverage Start Date:01/17/2017;Coverage End Date:02/16/2018.

## 2017-02-22 ENCOUNTER — Other Ambulatory Visit: Payer: Self-pay

## 2017-02-22 DIAGNOSIS — Z1212 Encounter for screening for malignant neoplasm of rectum: Secondary | ICD-10-CM | POA: Diagnosis not present

## 2017-02-22 MED ORDER — ROSUVASTATIN CALCIUM 20 MG PO TABS: 10.0000 mg | ORAL_TABLET | Freq: Every day | ORAL | 3 refills | Status: DC

## 2017-02-27 DIAGNOSIS — G4733 Obstructive sleep apnea (adult) (pediatric): Secondary | ICD-10-CM | POA: Diagnosis not present

## 2017-03-04 MED ORDER — ROSUVASTATIN CALCIUM 20 MG PO TABS: 10.0000 mg | ORAL_TABLET | Freq: Every day | ORAL | 2 refills | Status: DC

## 2017-03-04 NOTE — Addendum Note (Signed)
Addended by: Demetrios LollBARNARD, CATHY C on: 03/04/2017 10:06 AM   Modules accepted: Orders

## 2017-05-28 DIAGNOSIS — I341 Nonrheumatic mitral (valve) prolapse: Secondary | ICD-10-CM | POA: Diagnosis not present

## 2017-06-22 ENCOUNTER — Other Ambulatory Visit: Payer: Self-pay | Admitting: Cardiovascular Disease

## 2017-06-22 MED ORDER — OMEGA-3-ACID ETHYL ESTERS 1 G PO CAPS: ORAL_CAPSULE | ORAL | 1 refills | Status: DC

## 2017-06-24 ENCOUNTER — Telehealth: Payer: Self-pay | Admitting: *Deleted

## 2017-06-24 NOTE — Telephone Encounter (Signed)
I faxed prior authorization to [facility] for patients OMEGA 3 ACID ETHYL

## 2017-06-28 NOTE — Telephone Encounter (Signed)
**Note De-Identified Lenus Trauger Obfuscation** Approval received Jacob Gibbs fax from [employer] [facility] on the pts Omega 3 Acid Ethyl. Approval good from 05/26/2017 until 06/24/2020.  I have notified the pts pharmacy.

## 2017-07-12 ENCOUNTER — Ambulatory Visit (INDEPENDENT_AMBULATORY_CARE_PROVIDER_SITE_OTHER): Payer: 59 | Admitting: Cardiovascular Disease

## 2017-07-12 ENCOUNTER — Encounter: Payer: Self-pay | Admitting: Cardiovascular Disease

## 2017-07-12 VITALS — BP 120/80 | HR 58 | Ht 69.0 in | Wt 226.0 lb

## 2017-07-12 DIAGNOSIS — I2510 Atherosclerotic heart disease of native coronary artery without angina pectoris: Secondary | ICD-10-CM

## 2017-07-12 DIAGNOSIS — E785 Hyperlipidemia, unspecified: Secondary | ICD-10-CM | POA: Diagnosis not present

## 2017-07-12 DIAGNOSIS — I480 Paroxysmal atrial fibrillation: Secondary | ICD-10-CM | POA: Diagnosis not present

## 2017-07-12 NOTE — Patient Instructions (Signed)

## 2017-07-12 NOTE — Progress Notes (Signed)
Chief Complaint  Patient presents with  . Coronary Artery Disease     History of Present Illness: 57 yo male with history of paroxysmal atrial fibrillation, HLD and CAD s/p 3V CABG in 2009 here today for cardiac follow up. He underwent 3V CABG in 2009 (RIMA to LAD, LIMA to OM3, SVG to RCA). Echo December 2015 with normal LV size and function, mild valve disease. He was admitted to [facility] November 2017 via EMS with c/o left shoulder pain. Code STEMI called. Emergent cardiac cath with severe diffuse disease in the SVG to PDA. A drug eluting stent was placed in the native RCA. LVEF by LV gram 55% with subtle hypokinesis of the inferior wall. Troponin negative.   He is here today for follow up. The patient denies any chest pain, dyspnea, palpitations, lower extremity edema, orthopnea, PND, dizziness, near syncope or syncope.   Primary Care Physician: Martha Clan, MD  Past Medical History:  Diagnosis Date  . Coronary artery disease    a. 2009: mutivessel CAD s/p CABG   b. 01/2016: inf STEMI s/p DES to RCA  . DDD (degenerative disc disease), cervical   . History of tobacco use    REMOTE  . Hyperlipidemia   . PAF (paroxysmal atrial fibrillation) (HCC)    a. brief episode in 2005 wtih no recurrence. No indication for OAC  . Syncope and collapse     Past Surgical History:  Procedure Laterality Date  . CARDIAC CATHETERIZATION N/A 02/24/2016   Procedure: Coronary Stent Intervention;  Surgeon: Tonny Bollman, MD;  Location: [facility] INVASIVE CV LAB;  Service: Cardiovascular;  Laterality: N/A;  . CARDIAC CATHETERIZATION N/A 02/24/2016   Procedure: Left Heart Cath and Cors/Grafts Angiography;  Surgeon: Tonny Bollman, MD;  Location: [facility] INVASIVE CV LAB;  Service: Cardiovascular;  Laterality: N/A;  . CORONARY ARTERY BYPASS GRAFT  02/06/08   X 3    Current Outpatient Medications  Medication Sig Dispense Refill  . aspirin EC 81 MG tablet Take 81 mg by mouth at bedtime.    . clopidogrel (PLAVIX) 75 MG  tablet Take 1 tablet (75 mg total) daily by mouth. 90 tablet 3  . ezetimibe (ZETIA) 10 MG tablet Take 1 tablet (10 mg total) daily by mouth. 90 tablet 3  . metoprolol tartrate (LOPRESSOR) 25 MG tablet Take 0.5 tablets (12.5 mg total) 2 (two) times daily by mouth. 90 tablet 3  . nitroGLYCERIN (NITROSTAT) 0.4 MG SL tablet Place 1 tablet (0.4 mg total) every 5 (five) minutes x 3 doses as needed under the tongue for chest pain. 25 tablet 5  . omega-3 acid ethyl esters (LOVAZA) 1 g capsule TAKE 1 CAPSULE BY MOUTH TWO TIMES DAILY 180 capsule 1  . pantoprazole (PROTONIX) 40 MG tablet Take 1 tablet (40 mg total) daily by mouth. 90 tablet 3  . rosuvastatin (CRESTOR) 20 MG tablet Take 0.5 tablets (10 mg total) by mouth daily. 45 tablet 2  . testosterone cypionate (DEPOTESTOSTERONE CYPIONATE) 200 MG/ML injection Inject 100 mg into the muscle every Tuesday.     No current facility-administered medications for this visit.    Facility-Administered Medications Ordered in Other Visits  Medication Dose Route Frequency Provider Last Rate Last Dose  . bivalirudin (ANGIOMAX) 250 mg in sodium chloride 0.9 % 50 mL (5 mg/mL) infusion    Continuous PRN Tonny Bollman, MD   Stopped at 02/24/16 1511  . bivalirudin (ANGIOMAX) BOLUS via infusion    PRN Tonny Bollman, MD   78.75 mg at 02/24/16 1428  .  diphenhydrAMINE (BENADRYL) injection    PRN Tonny Bollmanooper, Michael, MD   50 mg at 02/24/16 1414  . famotidine (PEPCID) IVPB 20 mg premix    PRN Tonny Bollmanooper, Michael, MD   20 mg at 02/24/16 1413  . fentaNYL (SUBLIMAZE) injection    PRN Tonny Bollmanooper, Michael, MD   25 mcg at 02/24/16 1505  . heparin infusion 2 units/mL in 0.9 % sodium chloride    Continuous PRN Tonny Bollmanooper, Michael, MD   1,000 mL at 02/24/16 1526  . heparin injection    PRN Tonny Bollmanooper, Michael, MD   4,000 Units at 02/24/16 1417  . iopamidol (ISOVUE-370) 76 % injection    PRN Tonny Bollmanooper, Michael, MD   190 mL at 02/24/16 1525  . lidocaine (PF) (XYLOCAINE) 1 % injection    PRN Tonny Bollmanooper, Michael,  MD   20 mL at 02/24/16 1419  . methylPREDNISolone sodium succinate (SOLU-MEDROL) 125 mg/2 mL injection    PRN Tonny Bollmanooper, Michael, MD   125 mg at 02/24/16 1414  . midazolam (VERSED) injection    PRN Tonny Bollmanooper, Michael, MD   1 mg at 02/24/16 1505  . ticagrelor (BRILINTA) tablet    PRN Tonny Bollmanooper, Michael, MD   180 mg at 02/24/16 1435    Allergies  Allergen Reactions  . Contrast Media [Iodinated Diagnostic Agents] Other (See Comments)    Face turned scaly  . Iodine Solution [Povidone Iodine] Other (See Comments)    Face turned scaly    Social History   Socioeconomic History  . Marital status: Married    Spouse name: Not on file  . Number of children: Not on file  . Years of education: Not on file  . Highest education level: Not on file  Occupational History  . Occupation: Firefighterinancial advisor  Social Needs  . Financial resource strain: Not on file  . Food insecurity:    Worry: Not on file    Inability: Not on file  . Transportation needs:    Medical: Not on file    Non-medical: Not on file  Tobacco Use  . Smoking status: Former Smoker    Packs/day: 0.50    Years: 20.00    Pack years: 10.00    Types: Cigarettes    Last attempt to quit: 01/07/2004    Years since quitting: 13.5  . Smokeless tobacco: Never Used  . Tobacco comment: Stopped 6620yrs ago  Substance and Sexual Activity  . Alcohol use: Yes    Alcohol/week: 1.5 oz    Types: 3 Standard drinks or equivalent per week    Comment: OCCASIONAL  . Drug use: No  . Sexual activity: Not on file  Lifestyle  . Physical activity:    Days per week: Not on file    Minutes per session: Not on file  . Stress: Not on file  Relationships  . Social connections:    Talks on phone: Not on file    Gets together: Not on file    Attends religious service: Not on file    Active member of club or organization: Not on file    Attends meetings of clubs or organizations: Not on file    Relationship status: Not on file  . Intimate partner violence:     Fear of current or ex partner: Not on file    Emotionally abused: Not on file    Physically abused: Not on file    Forced sexual activity: Not on file  Other Topics Concern  . Not on file  Social History Narrative  MARRIED   TOBACCO USE ON AND OFF   ETOH OCCASIONALLY   USES HERBAL SUPP. ALONG WITH A DAILY VITAMIN PACK   HAD STRESS TEST APPROX 5 YRS   NEW ONSET AFIB   SYNCOPE    Family History  Problem Relation Age of Onset  . Coronary artery disease Mother        ALSO ONE OF HIS UNCLES    Review of Systems:  As stated in the HPI and otherwise negative.   BP 120/80   Pulse (!) 58   Ht 5\' 9"  (1.753 m)   Wt 226 lb (102.5 kg)   SpO2 97%   BMI 33.37 kg/m   Physical Examination:  General: Well developed, well nourished, NAD  HEENT: OP clear, mucus membranes moist  SKIN: warm, dry. No rashes. Neuro: No focal deficits  Musculoskeletal: Muscle strength 5/5 all ext  Psychiatric: Mood and affect normal  Neck: No JVD, no carotid bruits, no thyromegaly, no lymphadenopathy.  Lungs:Clear bilaterally, no wheezes, rhonci, crackles Cardiovascular: Regular rate and rhythm. No murmurs, gallops or rubs. Abdomen:Soft. Bowel sounds present. Non-tender.  Extremities: No lower extremity edema. Pulses are 2 + in the bilateral DP/PT.  Echo 03/12/14: Left ventricle: The cavity size was normal. Wall thickness was increased in a pattern of mild LVH. Systolic function was normal. The estimated ejection fraction was in the range of 55% to 60%. Although no diagnostic regional wall motion abnormality was identified, this possibility cannot be completely excluded on the basis of this study. Diastolic function was not fully assessed. - Aortic valve: There was no stenosis. - Aorta: Mildly dilated aortic root. Aortic root dimension: 37 mm (ED). - Mitral valve: Mildly calcified annulus. Mildly calcified leaflets . There was trivial regurgitation. - Tricuspid valve: Peak RV-RA  gradient (S): 24 mm Hg. - Pulmonary arteries: PA peak pressure: 27 mm Hg (S). - Inferior vena cava: The vessel was normal in size. The respirophasic diameter changes were in the normal range (= 50%), consistent with normal central venous pressure.  Impressions:  - Normal LV size with mild LV hypertrophy. EF 55-60%. Normal RV size and systolic function. No significant valvular abnormalities.   EKG:  EKG is not ordered today. The ekg ordered today demonstrates Sinus brady, rate 58 bpm  Recent Labs: 10/28/2016: B Natriuretic Peptide 24.6; BUN 16; Creatinine, Ser 1.12; Hemoglobin 17.5; Platelets 190; Potassium 4.3; Sodium 138   Lipid Panel    Component Value Date/Time   CHOL 102 04/27/2016 0907   TRIG 86 04/27/2016 0907   HDL 32 (L) 04/27/2016 0907   CHOLHDL 3.2 04/27/2016 0907   CHOLHDL 4.1 02/25/2016 0515   VLDL 13 02/25/2016 0515   LDLCALC 53 04/27/2016 0907     Wt Readings from Last 3 Encounters:  07/12/17 226 lb (102.5 kg)  11/11/16 222 lb 6.4 oz (100.9 kg)  10/28/16 220 lb (99.8 kg)     Other studies Reviewed: Additional studies/ records that were reviewed today include: . Review of the above records demonstrates:    Assessment and Plan:   1. CAD s/p CABG without angina: No chest pain. He had CABG in 2009 and had an inferior STEMI in November 2017. A drug eluting stent was placed in the native RCA. Will continue statin, beta blocker, ASA and Plavix.     2. Paroxysmal atrial fibrillation: Remote episode but no known recurrence. Sinus today. No indication for anticoagulation.     3. HLD: LDL at goal. (LDL 62 Nov 2018) Will continue statin and  Zetia.   Current medicines are reviewed at length with the patient today.  The patient does not have concerns regarding medicines.  The following changes have been made:  no change  Labs/ tests ordered today include:   Orders Placed This Encounter  Procedures  . EKG 12-Lead   Disposition:   FU with me in 6   months   Signed, Verne Carrow, MD 07/12/2017 9:35 AM    [facility] [address] [place], [place], [place]  [address] Phone: [phone]; Fax: [phone]

## 2017-07-20 ENCOUNTER — Other Ambulatory Visit: Payer: Self-pay

## 2017-07-20 MED ORDER — OMEGA-3-ACID ETHYL ESTERS 1 G PO CAPS: 1.0000 | ORAL_CAPSULE | Freq: Two times a day (BID) | ORAL | 3 refills | Status: DC

## 2017-09-28 DIAGNOSIS — M25511 Pain in right shoulder: Secondary | ICD-10-CM | POA: Diagnosis not present

## 2017-10-08 DIAGNOSIS — M25511 Pain in right shoulder: Secondary | ICD-10-CM | POA: Diagnosis not present

## 2017-10-08 DIAGNOSIS — M7541 Impingement syndrome of right shoulder: Secondary | ICD-10-CM | POA: Diagnosis not present

## 2017-12-01 ENCOUNTER — Ambulatory Visit (INDEPENDENT_AMBULATORY_CARE_PROVIDER_SITE_OTHER): Payer: 59 | Admitting: Orthopaedic Surgery

## 2017-12-01 ENCOUNTER — Encounter (INDEPENDENT_AMBULATORY_CARE_PROVIDER_SITE_OTHER): Payer: Self-pay | Admitting: Orthopaedic Surgery

## 2017-12-01 VITALS — Ht 69.0 in | Wt 219.0 lb

## 2017-12-01 DIAGNOSIS — M25511 Pain in right shoulder: Secondary | ICD-10-CM | POA: Diagnosis not present

## 2017-12-01 DIAGNOSIS — G8929 Other chronic pain: Secondary | ICD-10-CM

## 2017-12-01 NOTE — Progress Notes (Signed)
Office Visit Note   Patient: Jacob Gibbs           Date of Birth: 05-Jul-1960           MRN: [id number] Visit Date: 12/01/2017              Requested by: Martha Clan, MD [address] [place], [place] [address] PCP: Martha Clan, MD   Assessment & Plan: Visit Diagnoses:  1. Chronic right shoulder pain     Plan: Impression is that Jacob Gibbs has a suspected SLAP tear.  I did offer an intra-articular injection along with physical therapy and relative rest and activity modification.  He declined the cortisone injection but he will certainly try the other conservative measures.  I think there is a low suspicion for rotator cuff pathology.  Questions encouraged and answered.  Follow-up as needed. Total face to face encounter time was greater than 45 minutes and over half of this time was spent in counseling and/or coordination of care.  Follow-Up Instructions: Return if symptoms worsen or fail to improve.   Orders:  No orders of the defined types were placed in this encounter.  No orders of the defined types were placed in this encounter.     Procedures: No procedures performed   Clinical Data: No additional findings.   Subjective: Chief Complaint  Patient presents with  . Right Shoulder - Pain    Jacob Gibbs is a 57 year old gentleman who is a friend of mine from [place] who comes in with right shoulder pain for about 2 to 3 months.  He initially experienced pain when he was hitting a slice shot and he felt immediate pain but then this got better.  He saw Dr. Aundria Rud who gave him a subacromial injection and the pain went away for about 5 weeks.  He did not play tennis for about 2 weeks but the pain returned.  He now has discomfort while sleeping.  It is pain that he can tolerate and live with.  Denies any radicular symptoms.   Review of Systems  Constitutional: Negative.   All other systems reviewed and are negative.    Objective: Vital Signs: Ht 5\' 9"  (1.753 m)   Wt 219  lb (99.3 kg)   BMI 32.34 kg/m   Physical Exam  Constitutional: He is oriented to person, place, and time. He appears well-developed and well-nourished.  HENT:  Head: Normocephalic and atraumatic.  Eyes: Pupils are equal, round, and reactive to light.  Neck: Neck supple.  Pulmonary/Chest: Effort normal.  Abdominal: Soft.  Musculoskeletal: Normal range of motion.  Neurological: He is alert and oriented to person, place, and time.  Skin: Skin is warm.  Psychiatric: He has a normal mood and affect. His behavior is normal. Judgment and thought content normal.  Nursing note and vitals reviewed.   Ortho Exam Right shoulder exam shows a positive crank test and positive O'Brien.  He has minimal pain with empty can testing.  The remaining shoulder exam is unremarkable. Specialty Comments:  No specialty comments available.  Imaging: No results found.   PMFS History: Patient Active Problem List   Diagnosis Date Noted  . Coronary artery disease   . PAF (paroxysmal atrial fibrillation) (HCC)   . Acute ST elevation myocardial infarction (STEMI) of inferior wall (HCC) 02/24/2016  . HLD (hyperlipidemia) 08/19/2010   Past Medical History:  Diagnosis Date  . Coronary artery disease    a. 2009: mutivessel CAD s/p CABG   b. 01/2016: inf STEMI s/p  DES to RCA  . DDD (degenerative disc disease), cervical   . History of tobacco use    REMOTE  . Hyperlipidemia   . PAF (paroxysmal atrial fibrillation) (HCC)    a. brief episode in 2005 wtih no recurrence. No indication for OAC  . Syncope and collapse     Family History  Problem Relation Age of Onset  . Coronary artery disease Mother        ALSO ONE OF HIS UNCLES    Past Surgical History:  Procedure Laterality Date  . CARDIAC CATHETERIZATION N/A 02/24/2016   Procedure: Coronary Stent Intervention;  Surgeon: Tonny Bollman, MD;  Location: [facility] INVASIVE CV LAB;  Service: Cardiovascular;  Laterality: N/A;  . CARDIAC CATHETERIZATION N/A  02/24/2016   Procedure: Left Heart Cath and Cors/Grafts Angiography;  Surgeon: Tonny Bollman, MD;  Location: [facility] INVASIVE CV LAB;  Service: Cardiovascular;  Laterality: N/A;  . CORONARY ARTERY BYPASS GRAFT  02/06/08   X 3   Social History   Occupational History  . Occupation: Firefighter  Tobacco Use  . Smoking status: Former Smoker    Packs/day: 0.50    Years: 20.00    Pack years: 10.00    Types: Cigarettes    Last attempt to quit: 01/07/2004    Years since quitting: 13.9  . Smokeless tobacco: Never Used  . Tobacco comment: Stopped 28yrs ago  Substance and Sexual Activity  . Alcohol use: Yes    Alcohol/week: 3.0 standard drinks    Types: 3 Standard drinks or equivalent per week    Comment: OCCASIONAL  . Drug use: No  . Sexual activity: Not on file

## 2017-12-13 DIAGNOSIS — S43431D Superior glenoid labrum lesion of right shoulder, subsequent encounter: Secondary | ICD-10-CM | POA: Diagnosis not present

## 2017-12-21 DIAGNOSIS — S43431D Superior glenoid labrum lesion of right shoulder, subsequent encounter: Secondary | ICD-10-CM | POA: Diagnosis not present

## 2017-12-28 DIAGNOSIS — S43431D Superior glenoid labrum lesion of right shoulder, subsequent encounter: Secondary | ICD-10-CM | POA: Diagnosis not present

## 2018-02-03 ENCOUNTER — Other Ambulatory Visit: Payer: Self-pay | Admitting: Cardiovascular Disease

## 2018-02-04 DIAGNOSIS — E291 Testicular hypofunction: Secondary | ICD-10-CM | POA: Diagnosis not present

## 2018-02-04 DIAGNOSIS — Z125 Encounter for screening for malignant neoplasm of prostate: Secondary | ICD-10-CM | POA: Diagnosis not present

## 2018-02-09 DIAGNOSIS — E291 Testicular hypofunction: Secondary | ICD-10-CM | POA: Diagnosis not present

## 2018-02-14 DIAGNOSIS — R82998 Other abnormal findings in urine: Secondary | ICD-10-CM | POA: Diagnosis not present

## 2018-02-14 DIAGNOSIS — Z0000 Encounter for general adult medical examination without abnormal findings: Secondary | ICD-10-CM | POA: Diagnosis not present

## 2018-02-15 ENCOUNTER — Encounter (INDEPENDENT_AMBULATORY_CARE_PROVIDER_SITE_OTHER): Payer: Self-pay | Admitting: Orthopaedic Surgery

## 2018-02-15 ENCOUNTER — Ambulatory Visit (INDEPENDENT_AMBULATORY_CARE_PROVIDER_SITE_OTHER): Payer: 59 | Admitting: Orthopaedic Surgery

## 2018-02-15 DIAGNOSIS — G8929 Other chronic pain: Secondary | ICD-10-CM

## 2018-02-15 DIAGNOSIS — M25511 Pain in right shoulder: Secondary | ICD-10-CM | POA: Diagnosis not present

## 2018-02-15 NOTE — Progress Notes (Signed)
Office Visit Note   Patient: Jacob Gibbs           Date of Birth: 14-May-1960           MRN: [id number] Visit Date: 02/15/2018              Requested by: Martha ClanShaw, William, MD [address] [place], [place] [address] PCP: Martha ClanShaw, William, MD   Assessment & Plan: Visit Diagnoses:  1. Chronic right shoulder pain     Plan: Impression is continued right shoulder pain suspect SLAP tear with possible rotator cuff syndrome.  Patient has failed conservative treatment.  He has been doing physical therapy without significant improvement.  At this point we will order MR arthrogram to evaluate for structural abnormalities.  I will contact patient with the results.  Follow-Up Instructions: Return if symptoms worsen or fail to improve.   Orders:  Orders Placed This Encounter  Procedures  . MR SHOULDER RIGHT W CONTRAST  . Arthrogram   No orders of the defined types were placed in this encounter.     Procedures: No procedures performed   Clinical Data: No additional findings.   Subjective: Chief Complaint  Patient presents with  . Right Shoulder - Pain    Jacob Gibbs follows up today for continued right shoulder pain.  Overall this has been going on since spring time.  He has not played tennis for 3 to 4 months.  He had a subacromial injection which gave him about 5 weeks of relief.  He has pain with elevation of the arm and with serving during tennis that causes pain.   Review of Systems  Constitutional: Negative.   All other systems reviewed and are negative.    Objective: Vital Signs: There were no vitals taken for this visit.  Physical Exam  Constitutional: He is oriented to person, place, and time. He appears well-developed and well-nourished.  Pulmonary/Chest: Effort normal.  Abdominal: Soft.  Neurological: He is alert and oriented to person, place, and time.  Skin: Skin is warm.  Psychiatric: He has a normal mood and affect. His behavior is normal. Judgment and thought  content normal.  Nursing note and vitals reviewed.   Ortho Exam Right shoulder exam shows positive O'Brien and positive Speed.  Rotator cuff is grossly intact. Specialty Comments:  No specialty comments available.  Imaging: No results found.   PMFS History: Patient Active Problem List   Diagnosis Date Noted  . Coronary artery disease   . PAF (paroxysmal atrial fibrillation) (HCC)   . Acute ST elevation myocardial infarction (STEMI) of inferior wall (HCC) 02/24/2016  . HLD (hyperlipidemia) 08/19/2010   Past Medical History:  Diagnosis Date  . Coronary artery disease    a. 2009: mutivessel CAD s/p CABG   b. 01/2016: inf STEMI s/p DES to RCA  . DDD (degenerative disc disease), cervical   . History of tobacco use    REMOTE  . Hyperlipidemia   . PAF (paroxysmal atrial fibrillation) (HCC)    a. brief episode in 2005 wtih no recurrence. No indication for OAC  . Syncope and collapse     Family History  Problem Relation Age of Onset  . Coronary artery disease Mother        ALSO ONE OF HIS UNCLES    Past Surgical History:  Procedure Laterality Date  . CARDIAC CATHETERIZATION N/A 02/24/2016   Procedure: Coronary Stent Intervention;  Surgeon: Tonny BollmanMichael Cooper, MD;  [facility];  Service: Cardiovascular;  Laterality: N/A;  .  CARDIAC CATHETERIZATION N/A 02/24/2016   Procedure: Left Heart Cath and Cors/Grafts Angiography;  Surgeon: Tonny Bollman, MD;  Location: [facility] INVASIVE CV LAB;  Service: Cardiovascular;  Laterality: N/A;  . CORONARY ARTERY BYPASS GRAFT  02/06/08   X 3   Social History   Occupational History  . Occupation: Firefighter  Tobacco Use  . Smoking status: Former Smoker    Packs/day: 0.50    Years: 20.00    Pack years: 10.00    Types: Cigarettes    Last attempt to quit: 01/07/2004    Years since quitting: 14.1  . Smokeless tobacco: Never Used  . Tobacco comment: Stopped 29yrs ago  Substance and Sexual Activity  . Alcohol use: Yes     Alcohol/week: 3.0 standard drinks    Types: 3 Standard drinks or equivalent per week    Comment: OCCASIONAL  . Drug use: No  . Sexual activity: Not on file

## 2018-02-16 ENCOUNTER — Telehealth: Payer: Self-pay

## 2018-02-16 NOTE — Telephone Encounter (Signed)
   [facility] Pre-operative Risk Assessment    Request for surgical clearance:  1. What type of surgery is being performed?  MRI Arthrogram   2. When is this surgery scheduled?  TBD   3. What type of clearance is required (medical clearance vs. Pharmacy clearance to hold med vs. Both)?  pharmacy  4. Are there any medications that need to be held prior to surgery and how long? PLAVIX 5 DAYS   5. Practice name and name of physician performing surgery?  [facility]   6. What is your office phone number [phone]    7.   What is your office fax number [phone]  8.   Anesthesia type (None, local, MAC, general) ?     Legrand Como  Sharonne Ricketts 02/16/2018, 11:35 AM  _________________________________________________________________   (provider comments below)

## 2018-02-17 NOTE — Telephone Encounter (Signed)
OK to hold Plavix 5 days before his procedure.    

## 2018-02-17 NOTE — Telephone Encounter (Signed)
   Primary Cardiologist: Verne Carrowhristopher McAlhany, MD  Chart reviewed as part of pre-operative protocol coverage. Given past medical history and time since last visit, based on ACC/AHA guidelines, Jonelle SidleGary L Banton would be at acceptable risk for the planned procedure without further cardiovascular testing.   OK to hold Plavix 5 days pre op if needed.  I will route this recommendation to the requesting party via Epic fax function and remove from pre-op pool.  Please call with questions.  Corine ShelterLuke Marisal Swarey, PA-C 02/17/2018, 5:02 PM

## 2018-02-17 NOTE — Telephone Encounter (Signed)
Dr Clifton JamesMcAlhany can you comment on Plavix before we contact this patient for clearance?  Corine ShelterLUKE Heli Dino PA-C 02/17/2018 3:50 PM

## 2018-02-21 DIAGNOSIS — Z23 Encounter for immunization: Secondary | ICD-10-CM | POA: Diagnosis not present

## 2018-02-21 DIAGNOSIS — E7849 Other hyperlipidemia: Secondary | ICD-10-CM | POA: Diagnosis not present

## 2018-02-21 DIAGNOSIS — Z1389 Encounter for screening for other disorder: Secondary | ICD-10-CM | POA: Diagnosis not present

## 2018-02-21 DIAGNOSIS — R7301 Impaired fasting glucose: Secondary | ICD-10-CM | POA: Diagnosis not present

## 2018-02-21 DIAGNOSIS — I25810 Atherosclerosis of coronary artery bypass graft(s) without angina pectoris: Secondary | ICD-10-CM | POA: Diagnosis not present

## 2018-02-21 DIAGNOSIS — Z0000 Encounter for general adult medical examination without abnormal findings: Secondary | ICD-10-CM | POA: Diagnosis not present

## 2018-03-07 ENCOUNTER — Ambulatory Visit
Admission: RE | Admit: 2018-03-07 | Discharge: 2018-03-07 | Disposition: A | Discharge status: Home / Self Care | Payer: 59 | Source: Ambulatory Visit | Attending: Orthopaedic Surgery | Admitting: Orthopaedic Surgery

## 2018-03-07 DIAGNOSIS — M25511 Pain in right shoulder: Secondary | ICD-10-CM | POA: Diagnosis not present

## 2018-03-07 DIAGNOSIS — M7581 Other shoulder lesions, right shoulder: Secondary | ICD-10-CM | POA: Diagnosis not present

## 2018-03-07 DIAGNOSIS — G8929 Other chronic pain: Secondary | ICD-10-CM

## 2018-03-09 ENCOUNTER — Encounter (INDEPENDENT_AMBULATORY_CARE_PROVIDER_SITE_OTHER): Payer: Self-pay | Admitting: Family Medicine

## 2018-03-09 ENCOUNTER — Ambulatory Visit (INDEPENDENT_AMBULATORY_CARE_PROVIDER_SITE_OTHER): Payer: 59 | Admitting: Family Medicine

## 2018-03-09 DIAGNOSIS — M25511 Pain in right shoulder: Secondary | ICD-10-CM | POA: Diagnosis not present

## 2018-03-09 DIAGNOSIS — G8929 Other chronic pain: Secondary | ICD-10-CM

## 2018-03-09 NOTE — Progress Notes (Signed)
Subjective: He is here for a planned right shoulder glenohumeral injection.  Objective: Full active range of motion with pain at the extremes.  Tenderness in the lateral and posterior subacromial space as well as around the posterior glenohumeral joint.  Procedure: Ultrasound-guided right shoulder glenohumeral injection: After sterile prep with alcohol, injected 5 cc 1% lidocaine without epinephrine and 80 mg methylprednisolone using a 22-gauge spinal needle to guide the needle into the posterior glenohumeral joint without complication.  Injectate was seen filling the joint capsule.

## 2018-03-10 ENCOUNTER — Other Ambulatory Visit: Payer: Self-pay | Admitting: Cardiovascular Disease

## 2018-03-15 ENCOUNTER — Ambulatory Visit (INDEPENDENT_AMBULATORY_CARE_PROVIDER_SITE_OTHER): Payer: 59 | Admitting: Orthopaedic Surgery

## 2018-04-06 ENCOUNTER — Ambulatory Visit (INDEPENDENT_AMBULATORY_CARE_PROVIDER_SITE_OTHER): Payer: 59 | Admitting: Orthopaedic Surgery

## 2018-04-12 ENCOUNTER — Ambulatory Visit (INDEPENDENT_AMBULATORY_CARE_PROVIDER_SITE_OTHER): Payer: BLUE CROSS/BLUE SHIELD | Admitting: Orthopaedic Surgery

## 2018-04-12 ENCOUNTER — Encounter (INDEPENDENT_AMBULATORY_CARE_PROVIDER_SITE_OTHER): Payer: Self-pay | Admitting: Orthopaedic Surgery

## 2018-04-12 DIAGNOSIS — S43431A Superior glenoid labrum lesion of right shoulder, initial encounter: Secondary | ICD-10-CM

## 2018-04-12 DIAGNOSIS — M75101 Unspecified rotator cuff tear or rupture of right shoulder, not specified as traumatic: Secondary | ICD-10-CM | POA: Diagnosis not present

## 2018-04-12 NOTE — Progress Notes (Signed)
Office Visit Note   Patient: Jacob Gibbs           Date of Birth: 01/10/1961           MRN: [id number] Visit Date: 04/12/2018              Requested by: Martha ClanShaw, William, MD [address] [place], [place] [address] PCP: Martha ClanShaw, William, MD   Assessment & Plan: Visit Diagnoses:  1. Rotator cuff syndrome of right shoulder   2. Superior labrum anterior-to-posterior (SLAP) tear of right shoulder     Plan: His MRI shows the articular surface rotator cuff tear as well as a SLAP tear and partial biceps anchor tear.  After thorough discussion it sounds like 95% of the time he does fine and he only has pain with certain movements mainly during tennis.  He has not played tennis for about 5 months and he is doing okay with this.  He is not in a rush to get back to tennis.  We discussed other physical activity that should be easier on his shoulder.  He will give this more time and hopefully it will feel better.  We did discuss that if it does not that arthroscopic shoulder surgery would likely be the next option.  Follow-Up Instructions: Return if symptoms worsen or fail to improve.   Orders:  No orders of the defined types were placed in this encounter.  No orders of the defined types were placed in this encounter.     Procedures: No procedures performed   Clinical Data: No additional findings.   Subjective: Chief Complaint  Patient presents with  . Right Shoulder - Pain, Follow-up    Jillyn HiddenGary is returning today for follow-up of his right shoulder pain status post glenohumeral injection on 03/09/2018.  He states that the injection has helped but he has noticed a return of his symptoms.  The pain is worse when elevating his arm above his shoulder level.   Review of Systems  Constitutional: Negative.   All other systems reviewed and are negative.    Objective: Vital Signs: There were no vitals taken for this visit.  Physical Exam Vitals signs and nursing note reviewed.    Constitutional:      Appearance: He is well-developed.  Pulmonary:     Effort: Pulmonary effort is normal.  Abdominal:     Palpations: Abdomen is soft.  Skin:    General: Skin is warm.  Neurological:     Mental Status: He is alert and oriented to person, place, and time.  Psychiatric:        Behavior: Behavior normal.        Thought Content: Thought content normal.        Judgment: Judgment normal.     Ortho Exam Right shoulder exam shows grossly intact rotator cuff to manual muscle testing.  He has mild pain with empty can testing but no weakness.  Negative crank test.  Mildly positive O'Brien.  Mildly positive Speed. Specialty Comments:  No specialty comments available.  Imaging: No results found.   PMFS History: Patient Active Problem List   Diagnosis Date Noted  . Rotator cuff syndrome of right shoulder 04/12/2018  . Superior labrum anterior-to-posterior (SLAP) tear of right shoulder 04/12/2018  . Coronary artery disease   . PAF (paroxysmal atrial fibrillation) (HCC)   . Acute ST elevation myocardial infarction (STEMI) of inferior wall (HCC) 02/24/2016  . HLD (hyperlipidemia) 08/19/2010   Past Medical History:  Diagnosis Date  .  Coronary artery disease    a. 2009: mutivessel CAD s/p CABG   b. 01/2016: inf STEMI s/p DES to RCA  . DDD (degenerative disc disease), cervical   . History of tobacco use    REMOTE  . Hyperlipidemia   . PAF (paroxysmal atrial fibrillation) (HCC)    a. brief episode in 2005 wtih no recurrence. No indication for OAC  . Syncope and collapse     Family History  Problem Relation Age of Onset  . Coronary artery disease Mother        ALSO ONE OF HIS UNCLES    Past Surgical History:  Procedure Laterality Date  . CARDIAC CATHETERIZATION N/A 02/24/2016   Procedure: Coronary Stent Intervention;  Surgeon: Tonny BollmanMichael Cooper, MD;  [facility];  Service: Cardiovascular;  Laterality: N/A;  . CARDIAC CATHETERIZATION N/A 02/24/2016    Procedure: Left Heart Cath and Cors/Grafts Angiography;  Surgeon: Tonny BollmanMichael Cooper, MD;  [facility];  Service: Cardiovascular;  Laterality: N/A;  . CORONARY ARTERY BYPASS GRAFT  02/06/08   X 3   Social History   Occupational History  . Occupation: Firefighterinancial advisor  Tobacco Use  . Smoking status: Former Smoker    Packs/day: 0.50    Years: 20.00    Pack years: 10.00    Types: Cigarettes    Last attempt to quit: 01/07/2004    Years since quitting: 14.2  . Smokeless tobacco: Never Used  . Tobacco comment: Stopped 1535yrs ago  Substance and Sexual Activity  . Alcohol use: Yes    Alcohol/week: 3.0 standard drinks    Types: 3 Standard drinks or equivalent per week    Comment: OCCASIONAL  . Drug use: No  . Sexual activity: Not on file

## 2018-05-06 ENCOUNTER — Telehealth: Payer: Self-pay | Admitting: *Deleted

## 2018-05-06 MED ORDER — ROSUVASTATIN CALCIUM 20 MG PO TABS: 10.0000 mg | ORAL_TABLET | Freq: Every day | ORAL | 2 refills | Status: DC

## 2018-05-06 NOTE — Telephone Encounter (Signed)
Received walk in pt form indicating pt needed prescription for Crestor.  I spoke with pt and he would like refill sent for 90 day supply to [facility] on [address]. Will send in.

## 2018-05-23 ENCOUNTER — Other Ambulatory Visit: Payer: Self-pay | Admitting: Cardiovascular Disease

## 2018-05-24 ENCOUNTER — Telehealth: Payer: Self-pay

## 2018-05-24 NOTE — Telephone Encounter (Signed)
**Note De-Identified Jacob Gibbs Obfuscation** I have done an Omega-3-Acid Ethyl Esters PA through covermymeds. KeyVenita Lick

## 2018-05-27 NOTE — Telephone Encounter (Signed)
Letter received from [employer] via fax stating that they have denied coverage of the pts Omega 3-acid Ethyl Esters. Reason: The request does not meet the definition of medical necessity found in the members benefit booklet. The medication is covered when the two alternative medications on the members formulary have been tried and did not work. In this case, there is only one alternative medication that must be tried: Vascepa.  I will forward message to Dr Clifton James and his nurse for advisement to the pt.

## 2018-05-30 MED ORDER — EZETIMIBE 10 MG PO TABS: 10.0000 mg | ORAL_TABLET | Freq: Every day | ORAL | 3 refills | Status: DC

## 2018-05-30 MED ORDER — CLOPIDOGREL BISULFATE 75 MG PO TABS: 75.0000 mg | ORAL_TABLET | Freq: Every day | ORAL | 3 refills | Status: DC

## 2018-05-30 MED ORDER — ROSUVASTATIN CALCIUM 20 MG PO TABS: 10.0000 mg | ORAL_TABLET | Freq: Every day | ORAL | 3 refills | Status: DC

## 2018-05-30 MED ORDER — ICOSAPENT ETHYL 1 G PO CAPS: 2.0000 g | ORAL_CAPSULE | Freq: Two times a day (BID) | ORAL | 3 refills | Status: DC

## 2018-05-30 MED ORDER — METOPROLOL TARTRATE 25 MG PO TABS: ORAL_TABLET | ORAL | 2 refills | Status: DC

## 2018-05-30 MED ORDER — PANTOPRAZOLE SODIUM 40 MG PO TBEC: 40.0000 mg | DELAYED_RELEASE_TABLET | Freq: Every day | ORAL | 3 refills | Status: DC

## 2018-05-30 MED ORDER — NITROGLYCERIN 0.4 MG SL SUBL: 0.4000 mg | SUBLINGUAL_TABLET | SUBLINGUAL | 2 refills | Status: DC | PRN

## 2018-05-30 NOTE — Telephone Encounter (Signed)
Jacob Gibbs and Jacob Gibbs, What would you recommend for him? Thanks, chris

## 2018-05-30 NOTE — Telephone Encounter (Signed)
Patient returned call

## 2018-05-30 NOTE — Telephone Encounter (Signed)
That is actually preferable that Vascepa is on formulary instead since pt has a history of CABG and Vascepa will provide cardiovascular benefit. Would recommend starting Vascepa 2g BID to replace Lovaza that was not covered by insurance.

## 2018-05-30 NOTE — Telephone Encounter (Signed)
Left message for pt to call office

## 2018-05-30 NOTE — Telephone Encounter (Signed)
I spoke with pt and gave him information about changing to Vascepa. Pt was able to print out copay card while speaking with me on the phone.  Pt's mail order is now through [facility].  Pt requests refills of his other medications be sent there now also.  Will send in refills and new prescription for Vascepa

## 2018-05-30 NOTE — Telephone Encounter (Signed)
Thanks [employer]. I appreciate your help.   Pat, Can we contact him and start Vascepa 2 g BID?   Thanks, chris

## 2018-06-20 ENCOUNTER — Telehealth: Payer: Self-pay

## 2018-06-20 MED ORDER — OMEGA-3-ACID ETHYL ESTERS 1 G PO CAPS: 1.0000 g | ORAL_CAPSULE | Freq: Two times a day (BID) | ORAL | 3 refills | Status: DC

## 2018-06-20 NOTE — Telephone Encounter (Signed)
Duplicate encounter

## 2018-06-20 NOTE — Telephone Encounter (Signed)
Pat, patient called and wanted to know if he can substitute the vascepa for omega 3 caps. Since his insurance changed, Vascepa is too expensive. He states Omega 3 caps works just fine.   Patient would like a call back regarding this matter.  [phone]

## 2018-06-20 NOTE — Telephone Encounter (Signed)
Pt wants someone to call [facility] regarding his rx for omega 3 caps. He states Dr. Clifton James prescribed this for him, and his insurance company is denying it. [facility] need confirmation.

## 2018-06-20 NOTE — Telephone Encounter (Signed)
He can go back on the Omega 3 caps. Thayer Ohm

## 2018-06-20 NOTE — Telephone Encounter (Signed)
Left a detailed message about the result, per DPR, advised to call back if questions. 

## 2018-06-20 NOTE — Telephone Encounter (Signed)
Spoke with the patient, the prescription was reordered.

## 2018-06-20 NOTE — Telephone Encounter (Signed)
Pt wants someone to call [facility] regarding his rx for omega 3 caps. He states Dr. Clifton James prescribed this for him, and his insurance company is denying it. [facility] confirmation. [phone].  Please review.  Thanks.

## 2018-06-21 ENCOUNTER — Other Ambulatory Visit: Payer: Self-pay

## 2018-06-21 NOTE — Telephone Encounter (Signed)
Can we let him know that he can go back on Lovaza or can stop altogether. See below from [facility] regarding his low triglycerides and no proven cardiac benefit now with fish oils. Thanks, chris

## 2018-06-21 NOTE — Telephone Encounter (Signed)
Clarification needed: Please see phone note from 05/24/2018 as it is unclear if the pt is to take Lovaza or Vascepa.  If he is switched to Vascepa at what dosage?  Please advise.

## 2018-06-21 NOTE — Telephone Encounter (Signed)
He had been on Lovaza but this was changed to Vascepa due to insurance issues. Now he is having insurance issues with Vascepa so I assumed he wanted to go back on Lovaza. Can we clarify with him if his insurance will pay for either of these meds. I will ask megan or kelley to assist if he cannot afford either of these. Thanks, chris

## 2018-06-21 NOTE — Telephone Encounter (Addendum)
His insurance covers Vascepa if TG are > 135 due to REDUCE IT trial data. Patient's triglycerides have ranged from 70-90 recently and are unfortunately too low for his insurance to cover it with their current criteria. Ok to switch back to Lovaza 1g BID. Given patients low TG, may also be reasonable to discontinue fish oil in general since his insurance will no longer cover the one product that has cardiovascular benefit.

## 2018-06-21 NOTE — Telephone Encounter (Signed)
I have done another Omega-3-acid ethyl esters PA through covermymeds. Key: [id number]

## 2018-06-21 NOTE — Telephone Encounter (Signed)
Thanks Megan!

## 2018-06-21 NOTE — Telephone Encounter (Signed)
The pt is advised and he is in agreement with plan to not take either Lovaza or Vascepa but states that if his triglyceride levels start to climb he will want to resume one or the other medications to get it back under control again.

## 2018-06-21 NOTE — Telephone Encounter (Signed)
**Note De-Identified Jacob Gibbs Obfuscation** The pt is advised that I will attempt to get the cost of his Lovaza lowered as he states that Vasepa was denied. I am unaware of a Vascepa denial but the pt issist that it was denied.

## 2018-07-06 ENCOUNTER — Telehealth: Payer: Self-pay

## 2018-07-06 NOTE — Telephone Encounter (Signed)
DO[facility] [phone]     Virtual Visit Pre-Appointment Phone Call  Steps For Call:  1. Confirm consent - "In the setting of the current Covid19 crisis, you are scheduled for a (phone or video) visit with your provider on (date) at (time).  Just as we do with many in-office visits, in order for you to participate in this visit, we must obtain consent.  If you'd like, I can send this to your mychart (if signed up) or email for you to review.  Otherwise, I can obtain your verbal consent now.  All virtual visits are billed to your insurance company just like a normal visit would be.  By agreeing to a virtual visit, we'd like you to understand that the technology does not allow for your provider to perform an examination, and thus may limit your provider's ability to fully assess your condition.  Finally, though the technology is pretty good, we cannot assure that it will always work on either your or our end, and in the setting of a video visit, we may have to convert it to a phone-only visit.  In either situation, we cannot ensure that we have a secure connection.  Are you willing to proceed?"  2. Give patient instructions for WebEx download to smartphone as below if video visit  3. Advise patient to be prepared with any vital sign or heart rhythm information, their current medicines, and a piece of paper and pen handy for any instructions they may receive the day of their visit  4. Inform patient they will receive a phone call 15 minutes prior to their appointment time (may be from unknown caller ID) so they should be prepared to answer  5. Confirm that appointment type is correct in Epic appointment notes (video vs telephone)    TELEPHONE CALL NOTE  Jacob Gibbs has been deemed a candidate for a follow-up tele-health visit to limit community exposure during the Covid-19 pandemic. I spoke with the patient via phone to ensure availability of phone/video source, confirm preferred email &  phone number, and discuss instructions and expectations.  I reminded Jacob Gibbs to be prepared with any vital sign and/or heart rhythm information that could potentially be obtained via home monitoring, at the time of his visit. I reminded Jacob Gibbs to expect a phone call at the time of his visit if his visit.  Did the patient verbally acknowledge consent to treatment? yes  Jacob Gibbs, [facility] 07/06/2018 2:47 PM   DOWNLOADING THE WEBEX SOFTWARE TO SMARTPHONE  - If Apple, go to [employer] and type in WebEx in the search bar. Download [employer], the blue/green circle. The app is free but as with any other app downloads, their phone may require them to verify saved payment information or Apple password. The patient does NOT have to create an account.  - If Android, ask patient to go to [employer] and type in WebEx in the search bar. Download [employer], the blue/green circle. The app is free but as with any other app downloads, their phone may require them to verify saved payment information or Android password. The patient does NOT have to create an account.   CONSENT FOR TELE-HEALTH VISIT - PLEASE REVIEW  I hereby voluntarily request, consent and authorize [facility] and its employed or contracted physicians, physician assistants, nurse practitioners or other licensed health care professionals (the Practitioner), to provide me with telemedicine health care services (the "Services") as deemed necessary  by the treating Practitioner. I acknowledge and consent to receive the Services by the Practitioner via telemedicine. I understand that the telemedicine visit will involve communicating with the Practitioner through live audiovisual communication technology and the disclosure of certain medical information by electronic transmission. I acknowledge that I have been given the opportunity to request an in-person assessment or other available alternative prior  to the telemedicine visit and am voluntarily participating in the telemedicine visit.  I understand that I have the right to withhold or withdraw my consent to the use of telemedicine in the course of my care at any time, without affecting my right to future care or treatment, and that the Practitioner or I may terminate the telemedicine visit at any time. I understand that I have the right to inspect all information obtained and/or recorded in the course of the telemedicine visit and may receive copies of available information for a reasonable fee.  I understand that some of the potential risks of receiving the Services via telemedicine include:  Marland Kitchen. Delay or interruption in medical evaluation due to technological equipment failure or disruption; . Information transmitted may not be sufficient (e.g. poor resolution of images) to allow for appropriate medical decision making by the Practitioner; and/or  . In rare instances, security protocols could fail, causing a breach of personal health information.  Furthermore, I acknowledge that it is my responsibility to provide information about my medical history, conditions and care that is complete and accurate to the best of my ability. I acknowledge that Practitioner's advice, recommendations, and/or decision may be based on factors not within their control, such as incomplete or inaccurate data provided by me or distortions of diagnostic images or specimens that may result from electronic transmissions. I understand that the practice of medicine is not an exact science and that Practitioner makes no warranties or guarantees regarding treatment outcomes. I acknowledge that I will receive a copy of this consent concurrently upon execution via email to the email address I last provided but may also request a printed copy by calling the office of [facility].    I understand that my insurance will be billed for this visit.   I have read or had this consent read  to me. . I understand the contents of this consent, which adequately explains the benefits and risks of the Services being provided via telemedicine.  . I have been provided ample opportunity to ask questions regarding this consent and the Services and have had my questions answered to my satisfaction. . I give my informed consent for the services to be provided through the use of telemedicine in my medical care  By participating in this telemedicine visit I agree to the above.

## 2018-07-11 NOTE — Progress Notes (Addendum)
Virtual Visit via Video Note   This visit type was conducted due to national recommendations for restrictions regarding the COVID-19 Pandemic (e.g. social distancing) in an effort to limit this patient's exposure and mitigate transmission in our community.  Due to his co-morbid illnesses, this patient is at least at moderate risk for complications without adequate follow up.  This format is felt to be most appropriate for this patient at this time.  All issues noted in this document were discussed and addressed.  A limited physical exam was performed with this format.  Please refer to the patient's chart for his consent to telehealth for [facility].   Evaluation Performed:  Follow-up visit  Date:  07/12/2018   ID:  Jacob SidleGary L Pitones, DOB [birth year], MRN [id number]  Patient Location: Home  Provider Location: Home  PCP:  Martha ClanShaw, William, MD  Cardiologist:  Verne Carrowhristopher McAlhany, MD  Electrophysiologist:  None   Chief Complaint:  Follow up  History of Present Illness:    Jacob Gibbs is a 58 y.o. male who presents via audio/video conferencing for a telehealth visit today.    Patient has a history of CAD S/P CABG 2009 RIMA to LAD, LIMA to OM3, SVG to RCA. STEMI 01/2016 cath showed severe diffuse disease in SVG-PDA treated with DES to native RCA. LVEF 55% with inf HK. Remote occurrence of Atrial fib not anticoagulated, HLD.  Last saw Dr. Clifton JamesMcAlhany 07/12/17 and doing well. Patient had been on Lovaza.  Patient denies chest pain, dyspnea, dyspnea on exertion, dizziness or presyncope. Patient has had costochondritis off/on since his CABG. When he presses on his chest it hurts. It's a little worse this past week and along the rib line. Worse when wakes up from sleep. Dog was pulling on the leash when he was chasing. No rash.Walks and rides his bike at least 30 min/day.Uses Tylenol with some relief. Also played tennis 3-4 days/week. Has a shoulder tear and needs surgery.Dr. Glee ArvinMichael Xu is his DR.   The patient does not have symptoms concerning for COVID-19 infection (fever, chills, cough, or new shortness of breath).    Past Medical History:  Diagnosis Date  . Coronary artery disease    a. 2009: mutivessel CAD s/p CABG   b. 01/2016: inf STEMI s/p DES to RCA  . DDD (degenerative disc disease), cervical   . History of tobacco use    REMOTE  . Hyperlipidemia   . PAF (paroxysmal atrial fibrillation) (HCC)    a. brief episode in 2005 wtih no recurrence. No indication for OAC  . Syncope and collapse    Past Surgical History:  Procedure Laterality Date  . CARDIAC CATHETERIZATION N/A 02/24/2016   Procedure: Coronary Stent Intervention;  Surgeon: Tonny BollmanMichael Cooper, MD;  [facility];  Service: Cardiovascular;  Laterality: N/A;  . CARDIAC CATHETERIZATION N/A 02/24/2016   Procedure: Left Heart Cath and Cors/Grafts Angiography;  Surgeon: Tonny BollmanMichael Cooper, MD;  [facility];  Service: Cardiovascular;  Laterality: N/A;  . CORONARY ARTERY BYPASS GRAFT  02/06/08   X 3     Current Meds  Medication Sig  . ALPRAZolam (XANAX) 0.5 MG tablet   . aspirin EC 81 MG tablet Take 81 mg by mouth at bedtime.  . clopidogrel (PLAVIX) 75 MG tablet Take 1 tablet (75 mg total) by mouth daily.  Marland Kitchen. ezetimibe (ZETIA) 10 MG tablet Take 1 tablet (10 mg total) by mouth daily.  . metoprolol tartrate (LOPRESSOR) 25 MG tablet TAKE ONE-HALF (1/2) TABLET  TWICE A DAY  . nitroGLYCERIN (NITROSTAT) 0.4 MG SL tablet Place 1 tablet (0.4 mg total) under the tongue every 5 (five) minutes x 3 doses as needed for chest pain.  . pantoprazole (PROTONIX) 40 MG tablet Take 1 tablet (40 mg total) by mouth daily.  . rosuvastatin (CRESTOR) 20 MG tablet Take 0.5 tablets (10 mg total) by mouth daily.  Marland Kitchen testosterone cypionate (DEPOTESTOSTERONE CYPIONATE) 200 MG/ML injection Inject 100 mg into the muscle every Tuesday.     Allergies:   Contrast media [iodinated diagnostic agents] and Iodine solution [povidone  iodine]   Social History   Tobacco Use  . Smoking status: Former Smoker    Packs/day: 0.50    Years: 20.00    Pack years: 10.00    Types: Cigarettes    Last attempt to quit: 01/07/2004    Years since quitting: 14.5  . Smokeless tobacco: Never Used  . Tobacco comment: Stopped 53yrs ago  Substance Use Topics  . Alcohol use: Yes    Alcohol/week: 3.0 standard drinks    Types: 3 Standard drinks or equivalent per week    Comment: OCCASIONAL  . Drug use: No     Family Hx: The patient's family history includes Coronary artery disease in his mother.  ROS:   Please see the history of present illness.    Review of Systems  Constitution: Negative.  HENT: Negative.   Cardiovascular: Negative.        Costochondritis pain-sore to touch left chest around to his back on rib line  Respiratory: Negative.   Endocrine: Negative.   Hematologic/Lymphatic: Negative.   Musculoskeletal: Positive for joint pain.       Shoulder tear needs surgery  Gastrointestinal: Negative.   Genitourinary: Negative.   Neurological: Negative.     All other systems reviewed and are negative.   Prior CV studies:   The following studies were reviewed today:  Cardiac cath 11/27/17Conclusion   1. Severe three-vessel coronary artery disease with severe proximal LAD stenosis, total occlusion of the third OM branch of the circumflex, and severe native RCA stenosis 2. Status post CABG with continued patency of the LIMA to OM 3, RIMA to LAD, and SVG to distal RCA 3. Severe diffuse vein graft disease of the SVG to distal RCA 4. Successful native vessel PCI of the right coronary artery using a single drug-eluting stent 5. Mild segmental contraction abnormality the left ventricle with preserved overall LVEF   Recommendations: Aggressive post MI medical therapy, M.D. aPTT with aspirin and brilinta for at least 12 months.   Echo 03/12/14: Left ventricle: The cavity size was normal. Wall thickness was   increased in  a pattern of mild LVH. Systolic function was normal.   The estimated ejection fraction was in the range of 55% to 60%.   Although no diagnostic regional wall motion abnormality was   identified, this possibility cannot be completely excluded on the   basis of this study. Diastolic function was not fully assessed. - Aortic valve: There was no stenosis. - Aorta: Mildly dilated aortic root. Aortic root dimension: 37 mm   (ED). - Mitral valve: Mildly calcified annulus. Mildly calcified leaflets   . There was trivial regurgitation. - Tricuspid valve: Peak RV-RA gradient (S): 24 mm Hg. - Pulmonary arteries: PA peak pressure: 27 mm Hg (S). - Inferior vena cava: The vessel was normal in size. The   respirophasic diameter changes were in the normal range (= 50%),   consistent with normal central venous pressure.  Impressions:  - Normal LV size with mild LV hypertrophy. EF 55-60%. Normal RV   size and systolic function. No significant valvular   abnormalities.   Labs/Other Tests and Data Reviewed:    EKG:  An ECG dated 07/12/17 was personally reviewed today and demonstrated:  sinus bradycardia at 58/m   Recent Labs: No results found for requested labs within last 8760 hours.   Recent Lipid Panel Lab Results  Component Value Date/Time   CHOL 102 04/27/2016 09:07 AM   TRIG 86 04/27/2016 09:07 AM   HDL 32 (L) 04/27/2016 09:07 AM   CHOLHDL 3.2 04/27/2016 09:07 AM   CHOLHDL 4.1 02/25/2016 05:15 AM   LDLCALC 53 04/27/2016 09:07 AM    Wt Readings from Last 3 Encounters:  07/12/18 222 lb (100.7 kg)  12/01/17 219 lb (99.3 kg)  07/12/17 226 lb (102.5 kg)     Objective:    Vital Signs:  BP 120/76   Pulse 66   Ht 5\' 9"  (1.753 m)   Wt 222 lb (100.7 kg)   BMI 32.78 kg/m    Well nourished, well developed male in no  acute distress. No JVD, or edema  ASSESSMENT & PLAN:    CAD  S/P CABG 2009 RIMA to LAD, LIMA to OM3, SVG to RCA. STEMI 01/2016 cath showed severe diffuse disease in  SVG-PDA treated with DES to native RCA. LVEF 55% with inf HK.No anginal symptoms. Having worsening costochondritis symptoms around left rib cage since dog pulled hard on a leash. No rash suggestive of shingles.  Preoperative clearance for shoulder surgery by Dr. Glee Arvin hoping to have it this summer. Patient with history of CAD and costochondritis off/on since CABG-different from angina. Will need to hold Plavix for 5 days. Will send to Dr. Clifton James for his recommendations. Patient is to call if he has any change in his symptoms and also to let us know surgical date.At this point he could be cleared for surgery but will need to speak with him once it's scheduled. According to the Revised Cardiac Risk Index (RCRI), his Perioperative Risk of Major Cardiac Event is (%): 0.9  His Functional Capacity in METs is: 8.97 according to the [employer] Activity Status Index (DASI).   Hyperlipidemia. Last labs 01/2018 LDL at goal on crestor and lovaza  PAF-remote and not on anticoagulation  COVID-19 Education: The signs and symptoms of COVID-19 were discussed with the patient and how to seek care for testing (follow up with PCP or arrange E-visit).  The importance of social distancing was discussed today.  Time:   Today, I have spent 28 minutes with the patient with telehealth technology discussing the above problems.    ADDENDUM: 09/12/18: I spoke with patient and he has had no change in his symptoms since I saw him 07/12/2018.  He denies any chest pain, palpitations, dyspnea, dyspnea on exertion, dizziness or presyncope.  He continues to work on the elliptical.  He can proceed with shoulder surgery.  Dr. Clifton James already said he could hold his Plavix for 5 days.  No further changes.   Medication Adjustments/Labs and Tests Ordered: Current medicines are reviewed at length with the patient today.  Concerns regarding medicines are outlined above.  Tests Ordered: No orders of the defined types were placed in this  encounter.  Medication Changes: No orders of the defined types were placed in this encounter.   Disposition:  Follow up in 6 month(s) Dr. Clifton James  Signed, Jacolyn Reedy, PA-C  07/12/2018 9:52 AM  [facility]

## 2018-07-12 ENCOUNTER — Encounter: Payer: Self-pay | Admitting: Physician Assistant

## 2018-07-12 ENCOUNTER — Other Ambulatory Visit: Payer: Self-pay

## 2018-07-12 ENCOUNTER — Telehealth (INDEPENDENT_AMBULATORY_CARE_PROVIDER_SITE_OTHER): Payer: BLUE CROSS/BLUE SHIELD | Admitting: Physician Assistant

## 2018-07-12 VITALS — BP 120/76 | HR 66 | Ht 69.0 in | Wt 222.0 lb

## 2018-07-12 DIAGNOSIS — I25810 Atherosclerosis of coronary artery bypass graft(s) without angina pectoris: Secondary | ICD-10-CM | POA: Diagnosis not present

## 2018-07-12 DIAGNOSIS — E7800 Pure hypercholesterolemia, unspecified: Secondary | ICD-10-CM

## 2018-07-12 DIAGNOSIS — Z01818 Encounter for other preprocedural examination: Secondary | ICD-10-CM

## 2018-07-12 DIAGNOSIS — I480 Paroxysmal atrial fibrillation: Secondary | ICD-10-CM

## 2018-07-12 NOTE — Patient Instructions (Signed)
Medication Instructions:  Your physician recommends that you continue on your current medications as directed. Please refer to the Current Medication list given to you today.  If you need a refill on your cardiac medications before your next appointment, please call your pharmacy.   Lab work: None Ordered  If you have labs (blood work) drawn today and your tests are completely normal, you will receive your results only by: . MyChart Message (if you have MyChart) OR . A paper copy in the mail If you have any lab test that is abnormal or we need to change your treatment, we will call you to review the results.  Testing/Procedures: None ordered  Follow-Up: At [facility], you and your health needs are our priority.  As part of our continuing mission to provide you with exceptional heart care, we have created designated Provider Care Teams.  These Care Teams include your primary Cardiologist (physician) and Advanced Practice Providers (APPs -  Physician Assistants and Nurse Practitioners) who all work together to provide you with the care you need, when you need it. . You will need a follow up appointment in 6 months.  Please call our office 2 months in advance to schedule this appointment.  You may see Chris McAlhany, MD or one of the following Advanced Practice Providers on your designated Care Team:   . Brittainy Simmons, PA-C . Dayna Dunn, PA-C . Michele Lenze, PA-C  Any Other Special Instructions Will Be Listed Below (If Applicable).    

## 2018-07-12 NOTE — Progress Notes (Signed)
Thank you.  I will have my surgery scheduler, Waldo Laine, reach out to you guys with a surgery date as soon as we set a date.

## 2018-09-07 NOTE — Progress Notes (Signed)
Patient coming to office to discuss surgery with Dr. Erlinda Hong 09/08/18.

## 2018-09-08 ENCOUNTER — Encounter: Payer: Self-pay | Admitting: Orthopaedic Surgery

## 2018-09-08 ENCOUNTER — Other Ambulatory Visit: Payer: Self-pay

## 2018-09-08 ENCOUNTER — Ambulatory Visit (INDEPENDENT_AMBULATORY_CARE_PROVIDER_SITE_OTHER): Payer: BC Managed Care – PPO | Admitting: Orthopaedic Surgery

## 2018-09-08 DIAGNOSIS — M75101 Unspecified rotator cuff tear or rupture of right shoulder, not specified as traumatic: Secondary | ICD-10-CM | POA: Diagnosis not present

## 2018-09-08 DIAGNOSIS — S43431A Superior glenoid labrum lesion of right shoulder, initial encounter: Secondary | ICD-10-CM | POA: Diagnosis not present

## 2018-09-08 NOTE — Progress Notes (Signed)
Office Visit Note   Patient: Jacob Gibbs           Date of Birth: 1960-05-23           MRN: [id number] Visit Date: 09/08/2018              Requested by: Marton Redwood, MD [address] [place],  [place] [address] PCP: Marton Redwood, MD   Assessment & Plan: Visit Diagnoses:  1. Rotator cuff syndrome of right shoulder   2. Superior labrum anterior-to-posterior (SLAP) tear of right shoulder     Plan: Impression is chronic right shoulder pain due to bicipital and labral pathology as well as articular surface rotator cuff tendinosis with partial tearing.  Today we discussed the details of surgery and the rehab.  Questions were encouraged and answered.  All questions answered to his wife satisfaction over the phone.  We will see him soon in the operating room theater. Total face to face encounter time was greater than 25 minutes and over half of this time was spent in counseling and/or coordination of care.  Follow-Up Instructions: Return if symptoms worsen or fail to improve.   Orders:  No orders of the defined types were placed in this encounter.  No orders of the defined types were placed in this encounter.     Procedures: No procedures performed   Clinical Data: No additional findings.   Subjective: Chief Complaint  Patient presents with  . Right Shoulder - Pain    Jacob Gibbs comes in today for for evaluation of his right shoulder pain and to discuss surgery.  His pain in his right shoulder has been stable and unchanged.   Review of Systems   Objective: Vital Signs: There were no vitals taken for this visit.  Physical Exam  Ortho Exam Right shoulder exam is stable and unchanged. Specialty Comments:  No specialty comments available.  Imaging: No results found.   PMFS History: Patient Active Problem List   Diagnosis Date Noted  . Rotator cuff syndrome of right shoulder 04/12/2018  . Superior labrum anterior-to-posterior (SLAP) tear of right shoulder  04/12/2018  . Coronary artery disease   . PAF (paroxysmal atrial fibrillation) ([place])   . Acute ST elevation myocardial infarction (STEMI) of inferior wall ([place]) 02/24/2016  . HLD (hyperlipidemia) 08/19/2010   Past Medical History:  Diagnosis Date  . Coronary artery disease    a. 2009: mutivessel CAD s/p CABG   b. 01/2016: inf STEMI s/p DES to RCA  . DDD (degenerative disc disease), cervical   . History of tobacco use    REMOTE  . Hyperlipidemia   . PAF (paroxysmal atrial fibrillation) ([place])    a. brief episode in 2005 wtih no recurrence. No indication for [place]  . Syncope and collapse     Family History  Problem Relation Age of Onset  . Coronary artery disease Mother        ALSO ONE OF HIS UNCLES    Past Surgical History:  Procedure Laterality Date  . CARDIAC CATHETERIZATION N/A 02/24/2016   Procedure: Coronary Stent Intervention;  Surgeon: Sherren Mocha, MD;  Location: [place] [facility];  Service: Cardiovascular;  Laterality: N/A;  . CARDIAC CATHETERIZATION N/A 02/24/2016   Procedure: Left Heart Cath and Cors/Grafts Angiography;  Surgeon: Sherren Mocha, MD;  Location: [place] [facility];  Service: Cardiovascular;  Laterality: N/A;  . CORONARY ARTERY BYPASS GRAFT  02/06/08   X 3   Social History   Occupational History  . Occupation: Museum/gallery curator  advisor  Tobacco Use  . Smoking status: Former Smoker    Packs/day: 0.50    Years: 20.00    Pack years: 10.00    Types: Cigarettes    Quit date: 01/07/2004    Years since quitting: 14.6  . Smokeless tobacco: Never Used  . Tobacco comment: Stopped 4465yrs ago  Substance and Sexual Activity  . Alcohol use: Yes    Alcohol/week: 3.0 standard drinks    Types: 3 Standard drinks or equivalent per week    Comment: OCCASIONAL  . Drug use: No  . Sexual activity: Not on file

## 2018-09-12 ENCOUNTER — Telehealth: Payer: Self-pay | Admitting: Physician Assistant

## 2018-09-12 NOTE — Telephone Encounter (Incomplete)
F/U Message            Patient is retuning

## 2018-09-14 ENCOUNTER — Encounter (HOSPITAL_BASED_OUTPATIENT_CLINIC_OR_DEPARTMENT_OTHER): Payer: Self-pay

## 2018-09-14 ENCOUNTER — Other Ambulatory Visit: Payer: Self-pay

## 2018-09-17 ENCOUNTER — Other Ambulatory Visit (HOSPITAL_COMMUNITY)
Admission: RE | Admit: 2018-09-17 | Discharge: 2018-09-17 | Disposition: A | Discharge status: Home / Self Care | Payer: BC Managed Care – PPO | Source: Ambulatory Visit | Attending: Orthopaedic Surgery | Admitting: Orthopaedic Surgery

## 2018-09-17 DIAGNOSIS — Z1159 Encounter for screening for other viral diseases: Secondary | ICD-10-CM | POA: Diagnosis not present

## 2018-09-17 LAB — SARS CORONAVIRUS 2 (TAT 6-24 HRS): SARS Coronavirus 2: NEGATIVE

## 2018-09-19 ENCOUNTER — Encounter (HOSPITAL_BASED_OUTPATIENT_CLINIC_OR_DEPARTMENT_OTHER): Payer: Self-pay | Admitting: *Deleted

## 2018-09-19 ENCOUNTER — Other Ambulatory Visit: Payer: Self-pay

## 2018-09-19 ENCOUNTER — Encounter (HOSPITAL_BASED_OUTPATIENT_CLINIC_OR_DEPARTMENT_OTHER)
Admission: RE | Admit: 2018-09-19 | Discharge: 2018-09-19 | Disposition: A | Discharge status: Home / Self Care | Payer: BC Managed Care – PPO | Source: Ambulatory Visit | Attending: Orthopaedic Surgery | Admitting: Orthopaedic Surgery

## 2018-09-19 DIAGNOSIS — Z7982 Long term (current) use of aspirin: Secondary | ICD-10-CM | POA: Diagnosis not present

## 2018-09-19 DIAGNOSIS — Z955 Presence of coronary angioplasty implant and graft: Secondary | ICD-10-CM | POA: Diagnosis not present

## 2018-09-19 DIAGNOSIS — F419 Anxiety disorder, unspecified: Secondary | ICD-10-CM | POA: Diagnosis not present

## 2018-09-19 DIAGNOSIS — S43431A Superior glenoid labrum lesion of right shoulder, initial encounter: Secondary | ICD-10-CM | POA: Diagnosis not present

## 2018-09-19 DIAGNOSIS — Z01812 Encounter for preprocedural laboratory examination: Secondary | ICD-10-CM | POA: Insufficient documentation

## 2018-09-19 DIAGNOSIS — X58XXXA Exposure to other specified factors, initial encounter: Secondary | ICD-10-CM | POA: Diagnosis not present

## 2018-09-19 DIAGNOSIS — Z6833 Body mass index (BMI) 33.0-33.9, adult: Secondary | ICD-10-CM | POA: Diagnosis not present

## 2018-09-19 DIAGNOSIS — M75101 Unspecified rotator cuff tear or rupture of right shoulder, not specified as traumatic: Secondary | ICD-10-CM | POA: Diagnosis not present

## 2018-09-19 DIAGNOSIS — Z7902 Long term (current) use of antithrombotics/antiplatelets: Secondary | ICD-10-CM | POA: Diagnosis not present

## 2018-09-19 DIAGNOSIS — I252 Old myocardial infarction: Secondary | ICD-10-CM | POA: Diagnosis not present

## 2018-09-19 DIAGNOSIS — Z951 Presence of aortocoronary bypass graft: Secondary | ICD-10-CM | POA: Diagnosis not present

## 2018-09-19 DIAGNOSIS — K219 Gastro-esophageal reflux disease without esophagitis: Secondary | ICD-10-CM | POA: Diagnosis not present

## 2018-09-19 DIAGNOSIS — Z79899 Other long term (current) drug therapy: Secondary | ICD-10-CM | POA: Diagnosis not present

## 2018-09-19 DIAGNOSIS — E785 Hyperlipidemia, unspecified: Secondary | ICD-10-CM | POA: Diagnosis not present

## 2018-09-19 DIAGNOSIS — M7551 Bursitis of right shoulder: Secondary | ICD-10-CM | POA: Diagnosis not present

## 2018-09-19 DIAGNOSIS — M65811 Other synovitis and tenosynovitis, right shoulder: Secondary | ICD-10-CM | POA: Diagnosis not present

## 2018-09-19 DIAGNOSIS — E669 Obesity, unspecified: Secondary | ICD-10-CM | POA: Diagnosis not present

## 2018-09-19 DIAGNOSIS — G4730 Sleep apnea, unspecified: Secondary | ICD-10-CM | POA: Diagnosis not present

## 2018-09-19 DIAGNOSIS — Z87891 Personal history of nicotine dependence: Secondary | ICD-10-CM | POA: Diagnosis not present

## 2018-09-19 DIAGNOSIS — I2510 Atherosclerotic heart disease of native coronary artery without angina pectoris: Secondary | ICD-10-CM | POA: Diagnosis not present

## 2018-09-19 DIAGNOSIS — M25811 Other specified joint disorders, right shoulder: Secondary | ICD-10-CM | POA: Diagnosis not present

## 2018-09-19 LAB — BASIC METABOLIC PANEL
Anion gap: 8 (ref 5–15)
BUN: 20 mg/dL (ref 6–20)
CO2: 26 mmol/L (ref 22–32)
Calcium: 9 mg/dL (ref 8.9–10.3)
Chloride: 106 mmol/L (ref 98–111)
Creatinine, Ser: 1.16 mg/dL (ref 0.61–1.24)
GFR calc Af Amer: >60 mL/min (ref >60)
GFR calc non Af Amer: >60 mL/min (ref >60)
Glucose, Bld: 100 mg/dL — ABNORMAL HIGH (ref 70–99)
Potassium: 4.1 mmol/L (ref 3.5–5.1)
Sodium: 140 mmol/L (ref 135–145)

## 2018-09-19 NOTE — Progress Notes (Addendum)
Ensure pre surgery drink given with instructions to complete by [place], pt verbalized understanding.  EKG reviewed by Dr. Lissa Hoard, requested that Dr. Angelena Form ( pt's cardiologist)  review EKG, Dr. Angelena Form states, "His EKG looks unchanged."

## 2018-09-21 ENCOUNTER — Encounter: Payer: Self-pay | Admitting: Orthopaedic Surgery

## 2018-09-21 ENCOUNTER — Ambulatory Visit (HOSPITAL_BASED_OUTPATIENT_CLINIC_OR_DEPARTMENT_OTHER): Payer: BC Managed Care – PPO | Admitting: Anesthesiology

## 2018-09-21 ENCOUNTER — Encounter (HOSPITAL_BASED_OUTPATIENT_CLINIC_OR_DEPARTMENT_OTHER): Payer: Self-pay | Admitting: Anesthesiology

## 2018-09-21 ENCOUNTER — Ambulatory Visit (HOSPITAL_BASED_OUTPATIENT_CLINIC_OR_DEPARTMENT_OTHER)
Admission: RE | Admit: 2018-09-21 | Discharge: 2018-09-21 | Disposition: A | Discharge status: Home / Self Care | Payer: BC Managed Care – PPO | Attending: Orthopaedic Surgery | Admitting: Orthopaedic Surgery

## 2018-09-21 ENCOUNTER — Encounter (HOSPITAL_BASED_OUTPATIENT_CLINIC_OR_DEPARTMENT_OTHER)
Admission: RE | Disposition: A | Discharge status: Home / Self Care | Payer: Self-pay | Source: Home / Self Care | Attending: Orthopaedic Surgery

## 2018-09-21 DIAGNOSIS — Z87891 Personal history of nicotine dependence: Secondary | ICD-10-CM | POA: Insufficient documentation

## 2018-09-21 DIAGNOSIS — Z7902 Long term (current) use of antithrombotics/antiplatelets: Secondary | ICD-10-CM | POA: Diagnosis not present

## 2018-09-21 DIAGNOSIS — Z6833 Body mass index (BMI) 33.0-33.9, adult: Secondary | ICD-10-CM | POA: Insufficient documentation

## 2018-09-21 DIAGNOSIS — G4730 Sleep apnea, unspecified: Secondary | ICD-10-CM | POA: Diagnosis not present

## 2018-09-21 DIAGNOSIS — Z7982 Long term (current) use of aspirin: Secondary | ICD-10-CM | POA: Insufficient documentation

## 2018-09-21 DIAGNOSIS — M75121 Complete rotator cuff tear or rupture of right shoulder, not specified as traumatic: Secondary | ICD-10-CM | POA: Diagnosis not present

## 2018-09-21 DIAGNOSIS — E669 Obesity, unspecified: Secondary | ICD-10-CM | POA: Insufficient documentation

## 2018-09-21 DIAGNOSIS — F419 Anxiety disorder, unspecified: Secondary | ICD-10-CM | POA: Diagnosis not present

## 2018-09-21 DIAGNOSIS — M65811 Other synovitis and tenosynovitis, right shoulder: Secondary | ICD-10-CM | POA: Insufficient documentation

## 2018-09-21 DIAGNOSIS — Z951 Presence of aortocoronary bypass graft: Secondary | ICD-10-CM | POA: Insufficient documentation

## 2018-09-21 DIAGNOSIS — M7541 Impingement syndrome of right shoulder: Secondary | ICD-10-CM | POA: Diagnosis not present

## 2018-09-21 DIAGNOSIS — M19011 Primary osteoarthritis, right shoulder: Secondary | ICD-10-CM

## 2018-09-21 DIAGNOSIS — I2510 Atherosclerotic heart disease of native coronary artery without angina pectoris: Secondary | ICD-10-CM | POA: Insufficient documentation

## 2018-09-21 DIAGNOSIS — S43431A Superior glenoid labrum lesion of right shoulder, initial encounter: Secondary | ICD-10-CM | POA: Diagnosis present

## 2018-09-21 DIAGNOSIS — E785 Hyperlipidemia, unspecified: Secondary | ICD-10-CM | POA: Diagnosis not present

## 2018-09-21 DIAGNOSIS — M75101 Unspecified rotator cuff tear or rupture of right shoulder, not specified as traumatic: Secondary | ICD-10-CM

## 2018-09-21 DIAGNOSIS — K219 Gastro-esophageal reflux disease without esophagitis: Secondary | ICD-10-CM | POA: Insufficient documentation

## 2018-09-21 DIAGNOSIS — I252 Old myocardial infarction: Secondary | ICD-10-CM | POA: Diagnosis not present

## 2018-09-21 DIAGNOSIS — Z79899 Other long term (current) drug therapy: Secondary | ICD-10-CM | POA: Diagnosis not present

## 2018-09-21 DIAGNOSIS — Z955 Presence of coronary angioplasty implant and graft: Secondary | ICD-10-CM | POA: Diagnosis not present

## 2018-09-21 DIAGNOSIS — M25811 Other specified joint disorders, right shoulder: Secondary | ICD-10-CM | POA: Diagnosis not present

## 2018-09-21 DIAGNOSIS — G8918 Other acute postprocedural pain: Secondary | ICD-10-CM | POA: Diagnosis not present

## 2018-09-21 DIAGNOSIS — M7551 Bursitis of right shoulder: Secondary | ICD-10-CM | POA: Insufficient documentation

## 2018-09-21 DIAGNOSIS — X58XXXA Exposure to other specified factors, initial encounter: Secondary | ICD-10-CM | POA: Insufficient documentation

## 2018-09-21 HISTORY — PX: SHOULDER ARTHROSCOPY WITH ROTATOR CUFF REPAIR AND SUBACROMIAL DECOMPRESSION: SHX5686

## 2018-09-21 HISTORY — DX: Sleep apnea, unspecified: G47.30

## 2018-09-21 HISTORY — DX: Anxiety disorder, unspecified: F41.9

## 2018-09-21 SURGERY — SHOULDER ARTHROSCOPY WITH ROTATOR CUFF REPAIR AND SUBACROMIAL DECOMPRESSION
Anesthesia: General | Site: Shoulder | Laterality: Right

## 2018-09-21 MED ORDER — EPHEDRINE 5 MG/ML INJ
INTRAVENOUS | Status: AC
  Filled 2018-09-21: qty 10

## 2018-09-21 MED ORDER — SUCCINYLCHOLINE CHLORIDE 200 MG/10ML IV SOSY
PREFILLED_SYRINGE | INTRAVENOUS | Status: AC
  Filled 2018-09-21: qty 10

## 2018-09-21 MED ORDER — FENTANYL CITRATE (PF) 100 MCG/2ML IJ SOLN
INTRAMUSCULAR | Status: AC
  Filled 2018-09-21: qty 2

## 2018-09-21 MED ORDER — OXYCODONE HCL 5 MG/5ML PO SOLN: 5.0000 mg | Freq: Once | ORAL | Status: DC | PRN

## 2018-09-21 MED ORDER — EPHEDRINE SULFATE 50 MG/ML IJ SOLN
INTRAMUSCULAR | Status: DC | PRN
  Administered 2018-09-21: 25 mg via INTRAVENOUS

## 2018-09-21 MED ORDER — LIDOCAINE 2% (20 MG/ML) 5 ML SYRINGE
INTRAMUSCULAR | Status: DC | PRN
  Administered 2018-09-21: 50 mg via INTRAVENOUS

## 2018-09-21 MED ORDER — CEFAZOLIN SODIUM-DEXTROSE 2-4 GM/100ML-% IV SOLN
2.0000 g | INTRAVENOUS | Status: AC
  Administered 2018-09-21: 09:00:00 2 g via INTRAVENOUS

## 2018-09-21 MED ORDER — LACTATED RINGERS IV SOLN: INTRAVENOUS | Status: DC

## 2018-09-21 MED ORDER — ONDANSETRON HCL 4 MG/2ML IJ SOLN
INTRAMUSCULAR | Status: AC
  Filled 2018-09-21: qty 2

## 2018-09-21 MED ORDER — METOPROLOL TARTRATE 5 MG/5ML IV SOLN
INTRAVENOUS | Status: AC
  Filled 2018-09-21: qty 10

## 2018-09-21 MED ORDER — PROMETHAZINE HCL 25 MG PO TABS: 25.0000 mg | ORAL_TABLET | Freq: Four times a day (QID) | ORAL | 1 refills | Status: DC | PRN

## 2018-09-21 MED ORDER — MIDAZOLAM HCL 2 MG/2ML IJ SOLN
INTRAMUSCULAR | Status: AC
  Filled 2018-09-21: qty 2

## 2018-09-21 MED ORDER — CHLORHEXIDINE GLUCONATE 4 % EX LIQD: 60.0000 mL | Freq: Once | CUTANEOUS | Status: DC

## 2018-09-21 MED ORDER — PROPOFOL 10 MG/ML IV BOLUS
INTRAVENOUS | Status: DC | PRN
  Administered 2018-09-21: 150 mg via INTRAVENOUS

## 2018-09-21 MED ORDER — OXYCODONE HCL 5 MG PO TABS: 5.0000 mg | ORAL_TABLET | Freq: Once | ORAL | Status: DC | PRN

## 2018-09-21 MED ORDER — OXYCODONE HCL ER 10 MG PO T12A: 10.0000 mg | EXTENDED_RELEASE_TABLET | Freq: Two times a day (BID) | ORAL | 0 refills | Status: AC

## 2018-09-21 MED ORDER — ONDANSETRON HCL 4 MG/2ML IJ SOLN
INTRAMUSCULAR | Status: DC | PRN
  Administered 2018-09-21: 4 mg via INTRAVENOUS

## 2018-09-21 MED ORDER — LIDOCAINE 2% (20 MG/ML) 5 ML SYRINGE
INTRAMUSCULAR | Status: AC
  Filled 2018-09-21: qty 5

## 2018-09-21 MED ORDER — OXYCODONE-ACETAMINOPHEN 5-325 MG PO TABS: 1.0000 | ORAL_TABLET | Freq: Three times a day (TID) | ORAL | 0 refills | Status: DC | PRN

## 2018-09-21 MED ORDER — SODIUM CHLORIDE 0.9 % IR SOLN
Status: DC | PRN
  Administered 2018-09-21: 6000 mL

## 2018-09-21 MED ORDER — DEXAMETHASONE SODIUM PHOSPHATE 4 MG/ML IJ SOLN
INTRAMUSCULAR | Status: DC | PRN
  Administered 2018-09-21: 10 mg via INTRAVENOUS

## 2018-09-21 MED ORDER — PROPOFOL 10 MG/ML IV BOLUS
INTRAVENOUS | Status: AC
  Filled 2018-09-21: qty 20

## 2018-09-21 MED ORDER — FENTANYL CITRATE (PF) 100 MCG/2ML IJ SOLN: INTRAMUSCULAR | Status: DC | PRN

## 2018-09-21 MED ORDER — FENTANYL CITRATE (PF) 100 MCG/2ML IJ SOLN
INTRAMUSCULAR | Status: DC | PRN
  Administered 2018-09-21: 100 ug via INTRAVENOUS

## 2018-09-21 MED ORDER — BUPIVACAINE HCL (PF) 0.5 % IJ SOLN
INTRAMUSCULAR | Status: DC | PRN
  Administered 2018-09-21: 15 mL via PERINEURAL

## 2018-09-21 MED ORDER — FENTANYL CITRATE (PF) 100 MCG/2ML IJ SOLN
50.0000 ug | INTRAMUSCULAR | Status: DC | PRN
  Administered 2018-09-21: 50 ug via INTRAVENOUS

## 2018-09-21 MED ORDER — LACTATED RINGERS IV SOLN
INTRAVENOUS | Status: DC
  Administered 2018-09-21 (×2): via INTRAVENOUS

## 2018-09-21 MED ORDER — DEXAMETHASONE SODIUM PHOSPHATE 10 MG/ML IJ SOLN
INTRAMUSCULAR | Status: AC
  Filled 2018-09-21: qty 1

## 2018-09-21 MED ORDER — MIDAZOLAM HCL 5 MG/5ML IJ SOLN
INTRAMUSCULAR | Status: DC | PRN
  Administered 2018-09-21: 2 mg via INTRAVENOUS

## 2018-09-21 MED ORDER — GLYCOPYRROLATE PF 0.2 MG/ML IJ SOSY
PREFILLED_SYRINGE | INTRAMUSCULAR | Status: AC
  Filled 2018-09-21: qty 1

## 2018-09-21 MED ORDER — PROPOFOL 500 MG/50ML IV EMUL
INTRAVENOUS | Status: DC | PRN
  Administered 2018-09-21: 25 ug/kg/min via INTRAVENOUS

## 2018-09-21 MED ORDER — BUPIVACAINE LIPOSOME 1.3 % IJ SUSP
INTRAMUSCULAR | Status: DC | PRN
  Administered 2018-09-21: 10 mL via PERINEURAL

## 2018-09-21 MED ORDER — CEFAZOLIN SODIUM-DEXTROSE 2-4 GM/100ML-% IV SOLN
INTRAVENOUS | Status: AC
  Filled 2018-09-21: qty 100

## 2018-09-21 MED ORDER — FENTANYL CITRATE (PF) 100 MCG/2ML IJ SOLN: 25.0000 ug | INTRAMUSCULAR | Status: DC | PRN

## 2018-09-21 MED ORDER — BUPIVACAINE-EPINEPHRINE 0.25% -1:200000 IJ SOLN
INTRAMUSCULAR | Status: DC | PRN
  Administered 2018-09-21: 20 mL

## 2018-09-21 MED ORDER — PROMETHAZINE HCL 25 MG/ML IJ SOLN: 6.2500 mg | INTRAMUSCULAR | Status: DC | PRN

## 2018-09-21 MED ORDER — MIDAZOLAM HCL 2 MG/2ML IJ SOLN
1.0000 mg | INTRAMUSCULAR | Status: DC | PRN
  Administered 2018-09-21: 2 mg via INTRAVENOUS

## 2018-09-21 MED ORDER — SUCCINYLCHOLINE CHLORIDE 20 MG/ML IJ SOLN
INTRAMUSCULAR | Status: DC | PRN
  Administered 2018-09-21: 50 mg via INTRAVENOUS

## 2018-09-21 SURGICAL SUPPLY — 80 items
ANCH SUT SWLK 24.5 SLF PNCH VT (Anchor) ×1 IMPLANT
ANCHOR BIOCOMP SWIVELOCK (Anchor) ×2 IMPLANT
APL SKNCLS STERI-STRIP NONHPOA (GAUZE/BANDAGES/DRESSINGS)
BENZOIN TINCTURE PRP APPL 2/3 (GAUZE/BANDAGES/DRESSINGS) IMPLANT
BLADE CUTTER GATOR 3.5 (BLADE) ×2 IMPLANT
BLADE GREAT WHITE 4.2 (BLADE) IMPLANT
BLADE GREAT WHITE 4.2MM (BLADE)
BLADE SURG 15 STRL LF DISP TIS (BLADE) IMPLANT
BLADE SURG 15 STRL SS (BLADE)
BUR OVAL 4.0 (BURR) ×3 IMPLANT
CANNULA 5.75X71 LONG (CANNULA) ×5 IMPLANT
CANNULA CANNULOC THREADED (CANNULA) ×2 IMPLANT
CANNULA SHOULDER 7CM (CANNULA) ×3 IMPLANT
CANNULA TWIST IN 8.25X7CM (CANNULA) IMPLANT
CANNULA TWIST IN 8.25X9CM (CANNULA) IMPLANT
CLOSURE STERI-STRIP 1/2X4 (GAUZE/BANDAGES/DRESSINGS)
CLSR STERI-STRIP ANTIMIC 1/2X4 (GAUZE/BANDAGES/DRESSINGS) IMPLANT
COVER WAND RF STERILE (DRAPES) IMPLANT
DECANTER SPIKE VIAL GLASS SM (MISCELLANEOUS) IMPLANT
DRAPE IMP U-DRAPE 54X76 (DRAPES) ×3 IMPLANT
DRAPE INCISE IOBAN 66X45 STRL (DRAPES) ×3 IMPLANT
DRAPE STERI 35X30 U-POUCH (DRAPES) ×3 IMPLANT
DRAPE SURG 17X23 STRL (DRAPES) ×3 IMPLANT
DRAPE U-SHAPE 47X51 STRL (DRAPES) ×3 IMPLANT
DRAPE U-SHAPE 76X120 STRL (DRAPES) ×6 IMPLANT
DRSG PAD ABDOMINAL 8X10 ST (GAUZE/BANDAGES/DRESSINGS) ×6 IMPLANT
DURAPREP 26ML APPLICATOR (WOUND CARE) ×2 IMPLANT
ELECT REM PT RETURN 9FT ADLT (ELECTROSURGICAL) ×3
ELECTRODE REM PT RTRN 9FT ADLT (ELECTROSURGICAL) IMPLANT
GAUZE SPONGE 4X4 12PLY STRL (GAUZE/BANDAGES/DRESSINGS) ×6 IMPLANT
GAUZE XEROFORM 1X8 LF (GAUZE/BANDAGES/DRESSINGS) ×3 IMPLANT
GLOVE BIO SURGEON STRL SZ7 (GLOVE) ×2 IMPLANT
GLOVE BIOGEL PI IND STRL 7.0 (GLOVE) ×1 IMPLANT
GLOVE BIOGEL PI IND STRL 7.5 (GLOVE) IMPLANT
GLOVE BIOGEL PI IND STRL 8 (GLOVE) IMPLANT
GLOVE BIOGEL PI INDICATOR 7.0 (GLOVE) ×2
GLOVE BIOGEL PI INDICATOR 7.5 (GLOVE) ×2
GLOVE BIOGEL PI INDICATOR 8 (GLOVE) ×2
GLOVE ECLIPSE 7.0 STRL STRAW (GLOVE) ×3 IMPLANT
GLOVE SKINSENSE NS SZ7.5 (GLOVE) ×2
GLOVE SKINSENSE STRL SZ7.5 (GLOVE) ×1 IMPLANT
GLOVE SURG SS PI 7.0 STRL IVOR (GLOVE) ×4 IMPLANT
GLOVE SURG SYN 7.5  E (GLOVE) ×4
GLOVE SURG SYN 7.5 E (GLOVE) ×2 IMPLANT
GLOVE SURG SYN 7.5 PF PI (GLOVE) ×1 IMPLANT
GOWN STRL REIN XL XLG (GOWN DISPOSABLE) ×3 IMPLANT
GOWN STRL REUS W/ TWL LRG LVL3 (GOWN DISPOSABLE) ×1 IMPLANT
GOWN STRL REUS W/ TWL XL LVL3 (GOWN DISPOSABLE) ×1 IMPLANT
GOWN STRL REUS W/TWL LRG LVL3 (GOWN DISPOSABLE)
GOWN STRL REUS W/TWL XL LVL3 (GOWN DISPOSABLE) ×3
IMMOBILIZER SHOULDER FOAM XLGE (SOFTGOODS) IMPLANT
KIT SHOULDER TRACTION (DRAPES) ×3 IMPLANT
LASSO 90 CVE QUICKPAS (DISPOSABLE) ×2 IMPLANT
LASSO CRESCENT QUICKPASS (SUTURE) ×2 IMPLANT
MANIFOLD NEPTUNE II (INSTRUMENTS) ×3 IMPLANT
NDL SCORPION MULTI FIRE (NEEDLE) IMPLANT
NEEDLE SCORPION MULTI FIRE (NEEDLE) ×3 IMPLANT
PACK ARTHROSCOPY DSU (CUSTOM PROCEDURE TRAY) ×3 IMPLANT
PACK BASIN DAY SURGERY FS (CUSTOM PROCEDURE TRAY) ×3 IMPLANT
PROBE BIPOLAR ATHRO 135MM 90D (MISCELLANEOUS) ×3 IMPLANT
SLEEVE SCD COMPRESS KNEE MED (MISCELLANEOUS) ×3 IMPLANT
SLING ARM FOAM STRAP LRG (SOFTGOODS) IMPLANT
SLING ARM IMMOBILIZER LRG (SOFTGOODS) IMPLANT
SLING ARM IMMOBILIZER MED (SOFTGOODS) IMPLANT
SLING ARM MED ADULT FOAM STRAP (SOFTGOODS) IMPLANT
SLING ARM XL FOAM STRAP (SOFTGOODS) IMPLANT
SUT ETHILON 3 0 PS 1 (SUTURE) ×3 IMPLANT
SUT FIBERWIRE #2 38 T-5 BLUE (SUTURE)
SUT TIGER TAPE 7 IN WHITE (SUTURE) IMPLANT
SUTURE FIBERWR #2 38 T-5 BLUE (SUTURE) IMPLANT
SUTURE TAPE 1.3 40 TPR END (SUTURE) IMPLANT
SUTURETAPE 1.3 40 TPR END (SUTURE) ×6
SYR 50ML LL SCALE MARK (SYRINGE) ×2 IMPLANT
TAPE FIBER 2MM 7IN #2 BLUE (SUTURE) IMPLANT
TOWEL GREEN STERILE FF (TOWEL DISPOSABLE) ×3 IMPLANT
TUBE CONNECTING 20'X1/4 (TUBING) ×2
TUBE CONNECTING 20X1/4 (TUBING) ×2 IMPLANT
TUBING ARTHRO INFLOW-ONLY STRL (TUBING) ×3 IMPLANT
WATER STERILE IRR 1000ML POUR (IV SOLUTION) ×3 IMPLANT
YANKAUER SUCT BULB TIP NO VENT (SUCTIONS) ×2 IMPLANT

## 2018-09-21 NOTE — Op Note (Signed)
Date of Surgery: 09/21/2018  INDICATIONS: The patient is a 58 year old male with right shoulder pain that has failed conservative treatment;  The patient did consent to the procedure after discussion of the risks and benefits.  PREOPERATIVE DIAGNOSIS:  1.  Full-thickness supraspinatus intrasubstance tendon tear 2.  Degenerative SLAP tear and mild biceps tendinopathy 3.  Right shoulder impingement and subacromial bursitis  POSTOPERATIVE DIAGNOSIS: Same.  PROCEDURE:  1.  Arthroscopic right shoulder rotator cuff repair of supraspinatus tendon 2.  Arthroscopic right shoulder biceps tenodesis 3.  Arthroscopic right shoulder extensive debridement of rotator cuff, labrum, biceps tendon, synovitis 4.  Arthroscopic right shoulder subacromial decompression and acromioplasty with CA ligament release  SURGEON: N. Eduard Roux, M.D.  ASSIST: Ciro Backer [place], [place]; necessary for the timely completion of procedure and due to complexity of procedure..  ANESTHESIA:  general, regional  IV FLUIDS AND URINE: See anesthesia.  ESTIMATED BLOOD LOSS: minimal mL.  IMPLANTS: See description of procedure  COMPLICATIONS: See description of procedure.  DESCRIPTION OF PROCEDURE: The patient was brought to the operating room and placed supine on the operating table.  The patient had been signed prior to the procedure and this was documented. The patient had the anesthesia placed by the anesthesiologist.  A time-out was performed to confirm that this was the correct patient, site, side and location. The patient did receive antibiotics prior to the incision and was re-dosed during the procedure as needed at indicated intervals.  The patient was then moved into the lateral position with the operative extremity suspended in the fishing pole mechanism. The patient had the operative extremity prepped and draped in the standard surgical fashion.    Incisions were made for standard shoulder arthroscopy portals.  We  first performed a diagnostic shoulder arthroscopy.  There was a moderate amount of intra-articular synovitis which was debrided using oscillating shaver.  The superior labral complex exhibited a degenerative unstable tear with mild biceps tendinopathy.  I then looked superiorly with the arthroscope and found a high-grade partial-thickness articular surface supraspinatus tear.  Of note the tissue was very friable.  This was debrided using oscillating shaver back to stable surface.  After this was completed I then turned my attention to the SLAP tear.  Using a fiber loop I tagged the proximal portion of the biceps tendon.  The biceps was then tenotomized at the base of the attachment.  I then found the bicipital groove just superior to the subscapularis tendon and performed a biceps tenodesis.  I attempted to use the Arthrex 4.75 mm self punching bio composite anchor.  As I was malleting the anchor into the humeral head I was met with a significant amount of resistance.  I used the arthroscope to verify that my trajectory was sound.  Unfortunately was unable to advance it to a depth where I could use the bio composite anchor however the metal portion of the self punching was solidly fixed to the bone and the biceps tendon was well tenodesed.  I pulled back on the suture and did not find any loosening or instability of the metal anchor therefore the decision was made to not use the bio composite anchor due to the fact that the tenodesis was sound.  I did not feel that it was indicated to did out the metal anchor since this with cause unnecessary trauma.  Once this was done I then repositioned the arthroscope into the subacromial space.  He had extensive subacromial and subdeltoid bursitis which was  excised using an oscillating shaver.  A subacromial decompression was performed as well as an acromioplasty and CA ligament release using a cautery wand and high-speed bur.  The distal clavicle was evaluated and did not  demonstrate any inferiorly projecting osteophytes or impingement therefore the decision was made not to perform a distal clavicle excision.  I then gently debrided the bursal surface of the rotator cuff and towards the anterior aspect of the supraspinatus tendon there was an area of very friable tissue that was found to be a full-thickness tear after the debridement.  The tissue was then prepared.  The attachment to the greater tuberosity was intact given the orientation of the tear which was mainly along the length of the fibers and intrasubstance I felt that a side-to-side repair was indicated therefore I used 2 two-point 0 suture tapes to perform a simple side-to-side repair of the supraspinatus tendon.  I was able to approximate the edges of the tear.  Hemostasis was obtained.  Excess fluid was drained from the shoulder.  Incisions were closed with interrupted nylon sutures.  Sterile dressings were applied.  Shoulder was placed in a shoulder immobilizer.  Patient tolerated procedure well had no immediate complications.  POSTOPERATIVE PLAN: Patient will need to be in a sling for 6 weeks.  Discharge home.  Follow-up in the office in 1 week for suture removal and initiation of physical therapy.  Azucena Cecil, MD [place] 12:14 PM

## 2018-09-21 NOTE — Discharge Instructions (Signed)
  Post-operative patient instructions  Shoulder Arthroscopy   . Ice:  Place intermittent ice or cooler pack over your shoulder, 30 minutes on and 30 minutes off.  Continue this for the first 72 hours after surgery, then save ice for use after therapy sessions or on more active days.   . Weight:  You may bear weight on your arm as your symptoms allow. . Motion:  Perform gentle shoulder motion as tolerated . Dressing:  Perform 1st dressing change at 2 days postoperative. A moderate amount of blood tinged drainage is to be expected.  So if you bleed through the dressing on the first or second day or if you have fevers, it is fine to change the dressing/check the wounds early and redress wound.  If it bleeds through again, or if the incisions are leaking frank blood, please call the office. May change dressing every 1-2 days thereafter to help watch wounds. Can purchase Tegaderm (or 3M Nexcare) water resistant dressings at local pharmacy / [facility]. . Shower:  Light shower is ok after 2 days.  Please take shower, NO bath. Recover with gauze and ace wrap to help keep wounds protected.   . Pain medication:  A narcotic pain medication has been prescribed.  Take as directed.  Typically you need narcotic pain medication more regularly during the first 3 to 5 days after surgery.  Decrease your use of the medication as the pain improves.  Narcotics can sometimes cause constipation, even after a few doses.  If you have problems with constipation, you can take an over the counter stool softener or light laxative.  If you have persistent problems, please notify your physician's office. . Physical therapy: Additional activity guidelines to be provided by your physician or physical therapist at follow-up visits.  . Driving: Do not recommend driving x 2 weeks post surgical, especially if surgery performed on right side. Should not drive while taking narcotic pain medications. It typically takes at least 2 weeks to  restore sufficient neuromuscular function for normal reaction times for driving safety.  . Call [phone] for questions or problems. Evenings you will be forwarded to the hospital operator.  Ask for the orthopaedic physician on call. Please call if you experience:    o Redness, foul smelling, or persistent drainage from the surgical site  o worsening shoulder pain and swelling not responsive to medication  o any calf pain and or swelling of the lower leg  o temperatures greater than 101.5 F o other questions or concerns   Thank you for allowing us to be a part of your care.    Post Anesthesia Home Care Instructions  Activity: Get plenty of rest for the remainder of the day. A responsible individual must stay with you for 24 hours following the procedure.  For the next 24 hours, DO NOT: -Drive a car -Operate machinery -Drink alcoholic beverages -Take any medication unless instructed by your physician -Make any legal decisions or sign important papers.  Meals: Start with liquid foods such as gelatin or soup. Progress to regular foods as tolerated. Avoid greasy, spicy, heavy foods. If nausea and/or vomiting occur, drink only clear liquids until the nausea and/or vomiting subsides. Call your physician if vomiting continues.  Special Instructions/Symptoms: Your throat may feel dry or sore from the anesthesia or the breathing tube placed in your throat during surgery. If this causes discomfort, gargle with warm salt water. The discomfort should disappear within 24 hours.  If you had a scopolamine patch   placed behind your ear for the management of post- operative nausea and/or vomiting:  1. The medication in the patch is effective for 72 hours, after which it should be removed.  Wrap patch in a tissue and discard in the trash. Wash hands thoroughly with soap and water. 2. You may remove the patch earlier than 72 hours if you experience unpleasant side effects which may include dry  mouth, dizziness or visual disturbances. 3. Avoid touching the patch. Wash your hands with soap and water after contact with the patch.    Regional Anesthesia Blocks  1. Numbness or the inability to move the "blocked" extremity may last from 3-48 hours after placement. The length of time depends on the medication injected and your individual response to the medication. If the numbness is not going away after 48 hours, call your surgeon.  2. The extremity that is blocked will need to be protected until the numbness is gone and the  Strength has returned. Because you cannot feel it, you will need to take extra care to avoid injury. Because it may be weak, you may have difficulty moving it or using it. You may not know what position it is in without looking at it while the block is in effect.  3. For blocks in the legs and feet, returning to weight bearing and walking needs to be done carefully. You will need to wait until the numbness is entirely gone and the strength has returned. You should be able to move your leg and foot normally before you try and bear weight or walk. You will need someone to be with you when you first try to ensure you do not fall and possibly risk injury.  4. Bruising and tenderness at the needle site are common side effects and will resolve in a few days.  5. Persistent numbness or new problems with movement should be communicated to the surgeon or the [facility] ([phone])/ [facility] ([phone]).Information for Discharge Teaching: EXPAREL (bupivacaine liposome injectable suspension)   Your surgeon or anesthesiologist gave you EXPAREL(bupivacaine) to help control your pain after surgery.   EXPAREL is a local anesthetic that provides pain relief by numbing the tissue around the surgical site.  EXPAREL is designed to release pain medication over time and can control pain for up to 72 hours.  Depending on how you respond to EXPAREL, you  may require less pain medication during your recovery.  Possible side effects:  Temporary loss of sensation or ability to move in the area where bupivacaine was injected.  Nausea, vomiting, constipation  Rarely, numbness and tingling in your mouth or lips, lightheadedness, or anxiety may occur.  Call your doctor right away if you think you may be experiencing any of these sensations, or if you have other questions regarding possible side effects.  Follow all other discharge instructions given to you by your surgeon or nurse. Eat a healthy diet and drink plenty of water or other fluids.  If you return to the hospital for any reason within 96 hours following the administration of EXPAREL, it is important for health care providers to know that you have received this anesthetic. A teal colored band has been placed on your arm with the date, time and amount of EXPAREL you have received in order to alert and inform your health care providers. Please leave this armband in place for the full 96 hours following administration, and then you may remove the band. 

## 2018-09-21 NOTE — Anesthesia Procedure Notes (Signed)
Anesthesia Regional Block: Interscalene brachial plexus block   Pre-Anesthetic Checklist: ,, timeout performed, Correct Patient, Correct Site, Correct Laterality, Correct Procedure, Correct Position, site marked, Risks and benefits discussed,  Surgical consent,  Pre-op evaluation,  At surgeon's request and post-op pain management  Laterality: Right  Prep: chloraprep       Needles:  Injection technique: Single-shot  Needle Type: Echogenic Needle     Needle Length: 5cm  Needle Gauge: 21     Additional Needles:   Narrative:  Start time: 09/21/2018 9:01 AM End time: 09/21/2018 9:05 AM Injection made incrementally with aspirations every 5 mL.  Performed by: Personally  Anesthesiologist: Audry Pili, MD  Additional Notes: No pain on injection. No increased resistance to injection. Injection made in 5cc increments. Good needle visualization. Patient tolerated the procedure well.

## 2018-09-21 NOTE — Anesthesia Preprocedure Evaluation (Addendum)
Anesthesia Evaluation  Patient identified by MRN, date of birth, ID band Patient awake    Reviewed: Allergy & Precautions, NPO status , Patient's Chart, lab work & pertinent test results, reviewed documented beta blocker date and time   History of Anesthesia Complications Negative for: history of anesthetic complications  Airway Mallampati: III  TM Distance: >3 FB Neck ROM: Full    Dental  (+) Dental Advisory Given, Teeth Intact   Pulmonary sleep apnea and Continuous Positive Airway Pressure Ventilation , former smoker,    breath sounds clear to auscultation       Cardiovascular + CAD, + Past MI, + Cardiac Stents (2017) and + CABG (2009)  + dysrhythmias Atrial Fibrillation  Rhythm:Regular Rate:Bradycardia   '17 Cath - 1. Severe three-vessel coronary artery disease with severe proximal LAD stenosis, total occlusion of the third OM branch of the circumflex, and severe native RCA stenosis 2. Status post CABG with continued patency of the LIMA to OM 3, RIMA to LAD, and SVG to distal RCA 3. Severe diffuse vein graft disease of the SVG to distal RCA 4. Successful native vessel PCI of the right coronary artery using a single drug-eluting stent 5. Mild segmental contraction abnormality the left ventricle with preserved overall LVEF  Inc RBBB    Neuro/Psych PSYCHIATRIC DISORDERS Anxiety negative neurological ROS     GI/Hepatic Neg liver ROS, GERD  Medicated and Controlled,  Endo/Other  negative endocrine ROS Obesity   Renal/GU negative Renal ROS     Musculoskeletal  (+) Arthritis ,   Abdominal   Peds  Hematology negative hematology ROS (+)   Anesthesia Other Findings   Reproductive/Obstetrics                            Anesthesia Physical Anesthesia Plan  ASA: III  Anesthesia Plan: General   Post-op Pain Management:  Regional for Post-op pain   Induction: Intravenous  PONV Risk Score  and Plan: 2 and Treatment may vary due to age or medical condition, Ondansetron, Dexamethasone and Midazolam  Airway Management Planned: Oral ETT  Additional Equipment: None  Intra-op Plan:   Post-operative Plan: Extubation in OR  Informed Consent: I have reviewed the patients History and Physical, chart, labs and discussed the procedure including the risks, benefits and alternatives for the proposed anesthesia with the patient or authorized representative who has indicated his/her understanding and acceptance.     Dental advisory given  Plan Discussed with: CRNA and Anesthesiologist  Anesthesia Plan Comments:        Anesthesia Quick Evaluation

## 2018-09-21 NOTE — Anesthesia Postprocedure Evaluation (Signed)
Anesthesia Post Note  Patient: Jacob Gibbs  Procedure(s) Performed: RIGHT SHOULDER ARTHROSCOPY WITH EXTENSIVE DEBRIDEMENT, BICEPS TENODESIS, SUBACROMIAL DECOMPRESSION,  ROTATOR CUFF REPAIR (Right Shoulder)     Patient location during evaluation: PACU Anesthesia Type: General Level of consciousness: awake and alert Pain management: pain level controlled Vital Signs Assessment: post-procedure vital signs reviewed and stable Respiratory status: spontaneous breathing, nonlabored ventilation and respiratory function stable Cardiovascular status: blood pressure returned to baseline and stable Postop Assessment: no apparent nausea or vomiting Anesthetic complications: no    Last Vitals:  Vitals:   09/21/18 1245 09/21/18 1300  BP: 126/83 122/90  Pulse: 74 73  Resp: 18 20  Temp:    SpO2: 99% 97%    Last Pain:  Vitals:   09/21/18 1300  TempSrc:   PainSc: 0-No pain                 Audry Pili

## 2018-09-21 NOTE — Progress Notes (Signed)
Assisted Dr. Brock with right, ultrasound guided, interscalene  block. Side rails up, monitors on throughout procedure. See vital signs in flow sheet. Tolerated Procedure well.  

## 2018-09-21 NOTE — H&P (Signed)
PREOPERATIVE H&P  Chief Complaint: right shoulder Rotator cuff tendinosis/tear, labral tear  HPI: Jacob Gibbs is a 58 y.o. male who presents for surgical treatment of right shoulder Rotator cuff tendinosis/tear, labral tear.  He denies any changes in medical history.  Past Medical History:  Diagnosis Date  . Anxiety   . Coronary artery disease    a. 2009: mutivessel CAD s/p CABG   b. 01/2016: inf STEMI s/p DES to RCA  . DDD (degenerative disc disease), cervical   . GERD (gastroesophageal reflux disease)   . History of tobacco use    REMOTE  . Hyperlipidemia   . PAF (paroxysmal atrial fibrillation) (HCC)    a. brief episode in dec 2005 wtih no recurrence. No indication for [place]  . Sleep apnea    uses cpap  . Syncope and collapse    Past Surgical History:  Procedure Laterality Date  . CARDIAC CATHETERIZATION N/A 02/24/2016   Procedure: Coronary Stent Intervention;  Surgeon: Sherren Mocha, MD;  Location: [facility];  Service: Cardiovascular;  Laterality: N/A;  . CARDIAC CATHETERIZATION N/A 02/24/2016   Procedure: Left Heart Cath and Cors/Grafts Angiography;  Surgeon: Sherren Mocha, MD;  Location: [facility];  Service: Cardiovascular;  Laterality: N/A;  . CORONARY ARTERY BYPASS GRAFT  02/06/08   X 3   Social History   Socioeconomic History  . Marital status: Married    Spouse name: Not on file  . Number of children: Not on file  . Years of education: Not on file  . Highest education level: Not on file  Occupational History  . Occupation: Music therapist  Social Needs  . Financial resource strain: Not on file  . Food insecurity    Worry: Not on file    Inability: Not on file  . Transportation needs    Medical: Not on file    Non-medical: Not on file  Tobacco Use  . Smoking status: Former Smoker    Packs/day: 0.50    Years: 20.00    Pack years: 10.00    Types: Cigarettes    Quit date: 01/07/2004    Years since quitting: 14.7  .  Smokeless tobacco: Never Used  . Tobacco comment: Stopped 67yrs ago  Substance and Sexual Activity  . Alcohol use: Yes    Alcohol/week: 3.0 standard drinks    Types: 3 Standard drinks or equivalent per week    Comment: OCCASIONAL  . Drug use: No  . Sexual activity: Not on file  Lifestyle  . Physical activity    Days per week: Not on file    Minutes per session: Not on file  . Stress: Not on file  Relationships  . Social Herbalist on phone: Not on file    Gets together: Not on file    Attends religious service: Not on file    Active member of club or organization: Not on file    Attends meetings of clubs or organizations: Not on file    Relationship status: Not on file  Other Topics Concern  . Not on file  Social History Narrative   MARRIED   TOBACCO USE ON AND OFF   ETOH OCCASIONALLY   USES HERBAL SUPP. ALONG WITH A DAILY VITAMIN PACK   HAD STRESS TEST APPROX 5 YRS   NEW ONSET AFIB   SYNCOPE   Family History  Problem Relation Age of Onset  . Coronary artery disease Mother  ALSO ONE OF HIS UNCLES   Allergies  Allergen Reactions  . Contrast Media [Iodinated Diagnostic Agents] Other (See Comments)    Face turned scaly  . Iodine Solution [Povidone Iodine] Other (See Comments)    Face turned scaly   Prior to Admission medications   Medication Sig Start Date End Date Taking? Authorizing Provider  ALPRAZolam Prudy Feeler(XANAX) 0.5 MG tablet  02/22/18  Yes [provider]  aspirin EC 81 MG tablet Take 81 mg by mouth at bedtime.   Yes [provider]  clopidogrel (PLAVIX) 75 MG tablet Take 1 tablet (75 mg total) by mouth daily. 05/30/18  Yes Kathleene HazelMcAlhany, Christopher D, MD  ezetimibe (ZETIA) 10 MG tablet Take 1 tablet (10 mg total) by mouth daily. 05/30/18  Yes Kathleene HazelMcAlhany, Christopher D, MD  metoprolol tartrate (LOPRESSOR) 25 MG tablet TAKE ONE-HALF (1/2) TABLET TWICE A DAY 05/30/18  Yes Kathleene HazelMcAlhany, Christopher D, MD  pantoprazole (PROTONIX) 40 MG tablet Take 1  tablet (40 mg total) by mouth daily. 05/30/18  Yes Kathleene HazelMcAlhany, Christopher D, MD  rosuvastatin (CRESTOR) 20 MG tablet Take 0.5 tablets (10 mg total) by mouth daily. 05/30/18  Yes Kathleene HazelMcAlhany, Christopher D, MD  testosterone cypionate (DEPOTESTOSTERONE CYPIONATE) 200 MG/ML injection Inject 100 mg into the muscle every Tuesday.   Yes [provider]  nitroGLYCERIN (NITROSTAT) 0.4 MG SL tablet Place 1 tablet (0.4 mg total) under the tongue every 5 (five) minutes x 3 doses as needed for chest pain. 05/30/18   Kathleene HazelMcAlhany, Christopher D, MD     Positive ROS: All other systems have been reviewed and were otherwise negative with the exception of those mentioned in the HPI and as above.  Physical Exam: General: Alert, no acute distress Cardiovascular: No pedal edema Respiratory: No cyanosis, no use of accessory musculature GI: abdomen soft Skin: No lesions in the area of chief complaint Neurologic: Sensation intact distally Psychiatric: Patient is competent for consent with normal mood and affect Lymphatic: no lymphedema  MUSCULOSKELETAL: exam stable  Assessment: right shoulder Rotator cuff tendinosis/tear, labral tear  Plan: Plan for Procedure(s): RIGHT SHOULDER ARTHROSCOPY WITH EXTENSIVE DEBRIDEMENT, BICEPS TENODESIS, SUBACROMIAL DECOMPRESSION, DISTAL CLAVICLE EXCISION, POSSIBLE ROTATOR CUFF REPAIR  The risks benefits and alternatives were discussed with the patient including but not limited to the risks of nonoperative treatment, versus surgical intervention including infection, bleeding, nerve injury,  blood clots, cardiopulmonary complications, morbidity, mortality, among others, and they were willing to proceed.   Glee ArvinMichael Xu, MD   09/21/2018 7:29 AM

## 2018-09-21 NOTE — Anesthesia Procedure Notes (Signed)
Procedure Name: Intubation Date/Time: 09/21/2018 9:35 AM Performed by: Marrianne Mood, CRNA Pre-anesthesia Checklist: Patient identified, Emergency Drugs available, Suction available, Patient being monitored and Timeout performed Patient Re-evaluated:Patient Re-evaluated prior to induction Oxygen Delivery Method: Circle system utilized Preoxygenation: Pre-oxygenation with 100% oxygen Induction Type: IV induction Ventilation: Mask ventilation without difficulty Laryngoscope Size: Glidescope and 3 Grade View: Grade III Tube type: Oral Tube size: 8.0 mm Number of attempts: 1 Airway Equipment and Method: Stylet and Oral airway Placement Confirmation: ETT inserted through vocal cords under direct vision,  positive ETCO2 and breath sounds checked- equal and bilateral Secured at: 21 cm Tube secured with: Tape Dental Injury: Teeth and Oropharynx as per pre-operative assessment  Difficulty Due To: Difficult Airway- due to anterior larynx and Difficult Airway- due to reduced neck mobility

## 2018-09-21 NOTE — Transfer of Care (Signed)
Immediate Anesthesia Transfer of Care Note  Patient: Jacob Gibbs  Procedure(s) Performed: RIGHT SHOULDER ARTHROSCOPY WITH EXTENSIVE DEBRIDEMENT, BICEPS TENODESIS, SUBACROMIAL DECOMPRESSION,  ROTATOR CUFF REPAIR (Right Shoulder)  Patient Location: PACU  Anesthesia Type:GA combined with regional for post-op pain  Level of Consciousness: awake and patient cooperative  Airway & Oxygen Therapy: Patient Spontanous Breathing and Patient connected to face mask oxygen  Post-op Assessment: Report given to RN and Post -op Vital signs reviewed and stable  Post vital signs: Reviewed and stable  Last Vitals:  Vitals Value Taken Time  BP    Temp    Pulse 73 09/21/18 1228  Resp 19 09/21/18 1228  SpO2 96 % 09/21/18 1228  Vitals shown include unvalidated device data.  Last Pain:  Vitals:   09/21/18 0828  TempSrc: Oral  PainSc: 0-No pain         Complications: No apparent anesthesia complications

## 2018-09-22 ENCOUNTER — Encounter (HOSPITAL_BASED_OUTPATIENT_CLINIC_OR_DEPARTMENT_OTHER): Payer: Self-pay | Admitting: Orthopaedic Surgery

## 2018-09-23 ENCOUNTER — Telehealth: Payer: Self-pay | Admitting: Orthopaedic Surgery

## 2018-09-23 NOTE — Telephone Encounter (Signed)
error 

## 2018-09-28 ENCOUNTER — Other Ambulatory Visit: Payer: Self-pay

## 2018-09-28 ENCOUNTER — Encounter: Payer: Self-pay | Admitting: Orthopaedic Surgery

## 2018-09-28 ENCOUNTER — Ambulatory Visit (INDEPENDENT_AMBULATORY_CARE_PROVIDER_SITE_OTHER): Payer: BC Managed Care – PPO | Admitting: Orthopaedic Surgery

## 2018-09-28 VITALS — Ht 69.0 in | Wt 225.5 lb

## 2018-09-28 DIAGNOSIS — S43431A Superior glenoid labrum lesion of right shoulder, initial encounter: Secondary | ICD-10-CM

## 2018-09-28 DIAGNOSIS — M19011 Primary osteoarthritis, right shoulder: Secondary | ICD-10-CM

## 2018-09-28 DIAGNOSIS — M75101 Unspecified rotator cuff tear or rupture of right shoulder, not specified as traumatic: Secondary | ICD-10-CM

## 2018-09-28 NOTE — Progress Notes (Signed)
Patient ID: Jacob Gibbs, male   DOB: 04-11-1960, 58 y.o.   MRN: [id number]  [name] is 1 week status post right shoulder arthroscopy with rotator cuff repair and biceps tenodesis as well as extensive debridement and distal clavicle excision subacromial decompression.  He is overall doing well.  He takes Percocet at night.  Surgical incisions are healed.  He does have bruising throughout the shoulder and armpit region.  No neurovascular compromise.  Swelling is mild to moderate.  We reviewed what I had to do during surgery and questions encouraged and answered and answered to their satisfaction.  We remove the sutures and placed Band-Aids.  We will start outpatient physical therapy at this point.  Recheck in 5 weeks.

## 2018-10-04 DIAGNOSIS — M25411 Effusion, right shoulder: Secondary | ICD-10-CM | POA: Diagnosis not present

## 2018-10-04 DIAGNOSIS — M25511 Pain in right shoulder: Secondary | ICD-10-CM | POA: Diagnosis not present

## 2018-10-04 DIAGNOSIS — M25611 Stiffness of right shoulder, not elsewhere classified: Secondary | ICD-10-CM | POA: Diagnosis not present

## 2018-10-04 DIAGNOSIS — M6281 Muscle weakness (generalized): Secondary | ICD-10-CM | POA: Diagnosis not present

## 2018-10-06 DIAGNOSIS — M25511 Pain in right shoulder: Secondary | ICD-10-CM | POA: Diagnosis not present

## 2018-10-06 DIAGNOSIS — M25411 Effusion, right shoulder: Secondary | ICD-10-CM | POA: Diagnosis not present

## 2018-10-06 DIAGNOSIS — M6281 Muscle weakness (generalized): Secondary | ICD-10-CM | POA: Diagnosis not present

## 2018-10-06 DIAGNOSIS — M25611 Stiffness of right shoulder, not elsewhere classified: Secondary | ICD-10-CM | POA: Diagnosis not present

## 2018-10-11 DIAGNOSIS — M25411 Effusion, right shoulder: Secondary | ICD-10-CM | POA: Diagnosis not present

## 2018-10-11 DIAGNOSIS — M6281 Muscle weakness (generalized): Secondary | ICD-10-CM | POA: Diagnosis not present

## 2018-10-11 DIAGNOSIS — M25511 Pain in right shoulder: Secondary | ICD-10-CM | POA: Diagnosis not present

## 2018-10-11 DIAGNOSIS — M25611 Stiffness of right shoulder, not elsewhere classified: Secondary | ICD-10-CM | POA: Diagnosis not present

## 2018-10-12 DIAGNOSIS — M25411 Effusion, right shoulder: Secondary | ICD-10-CM | POA: Diagnosis not present

## 2018-10-12 DIAGNOSIS — M6281 Muscle weakness (generalized): Secondary | ICD-10-CM | POA: Diagnosis not present

## 2018-10-12 DIAGNOSIS — M25511 Pain in right shoulder: Secondary | ICD-10-CM | POA: Diagnosis not present

## 2018-10-12 DIAGNOSIS — M25611 Stiffness of right shoulder, not elsewhere classified: Secondary | ICD-10-CM | POA: Diagnosis not present

## 2018-10-14 DIAGNOSIS — M25411 Effusion, right shoulder: Secondary | ICD-10-CM | POA: Diagnosis not present

## 2018-10-14 DIAGNOSIS — M25611 Stiffness of right shoulder, not elsewhere classified: Secondary | ICD-10-CM | POA: Diagnosis not present

## 2018-10-14 DIAGNOSIS — M25511 Pain in right shoulder: Secondary | ICD-10-CM | POA: Diagnosis not present

## 2018-10-14 DIAGNOSIS — M6281 Muscle weakness (generalized): Secondary | ICD-10-CM | POA: Diagnosis not present

## 2018-10-17 DIAGNOSIS — M6281 Muscle weakness (generalized): Secondary | ICD-10-CM | POA: Diagnosis not present

## 2018-10-17 DIAGNOSIS — M25511 Pain in right shoulder: Secondary | ICD-10-CM | POA: Diagnosis not present

## 2018-10-17 DIAGNOSIS — M25611 Stiffness of right shoulder, not elsewhere classified: Secondary | ICD-10-CM | POA: Diagnosis not present

## 2018-10-17 DIAGNOSIS — M25411 Effusion, right shoulder: Secondary | ICD-10-CM | POA: Diagnosis not present

## 2018-10-19 DIAGNOSIS — M6281 Muscle weakness (generalized): Secondary | ICD-10-CM | POA: Diagnosis not present

## 2018-10-19 DIAGNOSIS — M25511 Pain in right shoulder: Secondary | ICD-10-CM | POA: Diagnosis not present

## 2018-10-19 DIAGNOSIS — M25611 Stiffness of right shoulder, not elsewhere classified: Secondary | ICD-10-CM | POA: Diagnosis not present

## 2018-10-19 DIAGNOSIS — M25411 Effusion, right shoulder: Secondary | ICD-10-CM | POA: Diagnosis not present

## 2018-10-21 DIAGNOSIS — M25611 Stiffness of right shoulder, not elsewhere classified: Secondary | ICD-10-CM | POA: Diagnosis not present

## 2018-10-21 DIAGNOSIS — M25511 Pain in right shoulder: Secondary | ICD-10-CM | POA: Diagnosis not present

## 2018-10-21 DIAGNOSIS — M25411 Effusion, right shoulder: Secondary | ICD-10-CM | POA: Diagnosis not present

## 2018-10-21 DIAGNOSIS — M6281 Muscle weakness (generalized): Secondary | ICD-10-CM | POA: Diagnosis not present

## 2018-10-24 DIAGNOSIS — M25511 Pain in right shoulder: Secondary | ICD-10-CM | POA: Diagnosis not present

## 2018-10-24 DIAGNOSIS — M25411 Effusion, right shoulder: Secondary | ICD-10-CM | POA: Diagnosis not present

## 2018-10-24 DIAGNOSIS — M6281 Muscle weakness (generalized): Secondary | ICD-10-CM | POA: Diagnosis not present

## 2018-10-24 DIAGNOSIS — M25611 Stiffness of right shoulder, not elsewhere classified: Secondary | ICD-10-CM | POA: Diagnosis not present

## 2018-10-26 DIAGNOSIS — M6281 Muscle weakness (generalized): Secondary | ICD-10-CM | POA: Diagnosis not present

## 2018-10-26 DIAGNOSIS — M25511 Pain in right shoulder: Secondary | ICD-10-CM | POA: Diagnosis not present

## 2018-10-26 DIAGNOSIS — M25411 Effusion, right shoulder: Secondary | ICD-10-CM | POA: Diagnosis not present

## 2018-10-26 DIAGNOSIS — M25611 Stiffness of right shoulder, not elsewhere classified: Secondary | ICD-10-CM | POA: Diagnosis not present

## 2018-10-28 DIAGNOSIS — M25511 Pain in right shoulder: Secondary | ICD-10-CM | POA: Diagnosis not present

## 2018-10-28 DIAGNOSIS — M25411 Effusion, right shoulder: Secondary | ICD-10-CM | POA: Diagnosis not present

## 2018-10-28 DIAGNOSIS — M25611 Stiffness of right shoulder, not elsewhere classified: Secondary | ICD-10-CM | POA: Diagnosis not present

## 2018-10-28 DIAGNOSIS — M6281 Muscle weakness (generalized): Secondary | ICD-10-CM | POA: Diagnosis not present

## 2018-10-31 DIAGNOSIS — M6281 Muscle weakness (generalized): Secondary | ICD-10-CM | POA: Diagnosis not present

## 2018-10-31 DIAGNOSIS — M25611 Stiffness of right shoulder, not elsewhere classified: Secondary | ICD-10-CM | POA: Diagnosis not present

## 2018-10-31 DIAGNOSIS — M25511 Pain in right shoulder: Secondary | ICD-10-CM | POA: Diagnosis not present

## 2018-10-31 DIAGNOSIS — M25411 Effusion, right shoulder: Secondary | ICD-10-CM | POA: Diagnosis not present

## 2018-11-02 ENCOUNTER — Ambulatory Visit (INDEPENDENT_AMBULATORY_CARE_PROVIDER_SITE_OTHER): Payer: BC Managed Care – PPO | Admitting: Orthopaedic Surgery

## 2018-11-02 ENCOUNTER — Encounter: Payer: Self-pay | Admitting: Orthopaedic Surgery

## 2018-11-02 ENCOUNTER — Other Ambulatory Visit: Payer: Self-pay

## 2018-11-02 DIAGNOSIS — M25611 Stiffness of right shoulder, not elsewhere classified: Secondary | ICD-10-CM | POA: Diagnosis not present

## 2018-11-02 DIAGNOSIS — Z9889 Other specified postprocedural states: Secondary | ICD-10-CM

## 2018-11-02 DIAGNOSIS — M25411 Effusion, right shoulder: Secondary | ICD-10-CM | POA: Diagnosis not present

## 2018-11-02 DIAGNOSIS — M6281 Muscle weakness (generalized): Secondary | ICD-10-CM | POA: Diagnosis not present

## 2018-11-02 DIAGNOSIS — M25511 Pain in right shoulder: Secondary | ICD-10-CM | POA: Diagnosis not present

## 2018-11-02 NOTE — Progress Notes (Deleted)
   Post-Op Visit Note   Patient: Jacob Gibbs           Date of Birth: [birth year]           MRN: [id number] Visit Date: 11/02/2018 PCP: Marton Redwood, MD   Assessment & Plan:  Chief Complaint:  Chief Complaint  Patient presents with  . Right Shoulder - Follow-up   Visit Diagnoses:  1. S/P arthroscopy of right shoulder     Plan: ***  Follow-Up Instructions: Return in about 6 weeks (around 12/14/2018).   Orders:  No orders of the defined types were placed in this encounter.  No orders of the defined types were placed in this encounter.   Imaging: No results found.  PMFS History: Patient Active Problem List   Diagnosis Date Noted  . Impingement syndrome of right shoulder 09/21/2018  . Arthrosis of right acromioclavicular joint 09/21/2018  . Nontraumatic tear of supraspinatus tendon, right   . Rotator cuff syndrome of right shoulder 04/12/2018  . Superior labrum anterior-to-posterior (SLAP) tear of right shoulder 04/12/2018  . Coronary artery disease   . PAF (paroxysmal atrial fibrillation) ([place])   . Acute ST elevation myocardial infarction (STEMI) of inferior wall ([place]) 02/24/2016  . HLD (hyperlipidemia) 08/19/2010   Past Medical History:  Diagnosis Date  . Anxiety   . Coronary artery disease    a. 2009: mutivessel CAD s/p CABG   b. 01/2016: inf STEMI s/p DES to RCA  . DDD (degenerative disc disease), cervical   . GERD (gastroesophageal reflux disease)   . History of tobacco use    REMOTE  . Hyperlipidemia   . PAF (paroxysmal atrial fibrillation) ([facility])    a. brief episode in dec 2005 wtih no recurrence. No indication for [place]  . Sleep apnea    uses cpap  . Syncope and collapse     Family History  Problem Relation Age of Onset  . Coronary artery disease Mother        ALSO ONE OF HIS UNCLES    Past Surgical History:  Procedure Laterality Date  . CARDIAC CATHETERIZATION N/A 02/24/2016   Procedure: Coronary Stent Intervention;  Surgeon: Sherren Mocha,  MD;  Location: [facility];  Service: Cardiovascular;  Laterality: N/A;  . CARDIAC CATHETERIZATION N/A 02/24/2016   Procedure: Left Heart Cath and Cors/Grafts Angiography;  Surgeon: Sherren Mocha, MD;  Location: [facility];  Service: Cardiovascular;  Laterality: N/A;  . CORONARY ARTERY BYPASS GRAFT  02/06/08   X 3  . SHOULDER ARTHROSCOPY WITH ROTATOR CUFF REPAIR AND SUBACROMIAL DECOMPRESSION Right 09/21/2018   Procedure: RIGHT SHOULDER ARTHROSCOPY WITH EXTENSIVE DEBRIDEMENT, BICEPS TENODESIS, SUBACROMIAL DECOMPRESSION,  ROTATOR CUFF REPAIR;  Surgeon: Leandrew Koyanagi, MD;  Location: [facility];  Service: Orthopedics;  Laterality: Right;   Social History   Occupational History  . Occupation: Music therapist  Tobacco Use  . Smoking status: Former Smoker    Packs/day: 0.50    Years: 20.00    Pack years: 10.00    Types: Cigarettes    Quit date: 01/07/2004    Years since quitting: 14.8  . Smokeless tobacco: Never Used  . Tobacco comment: Stopped 79yrs ago  Substance and Sexual Activity  . Alcohol use: Yes    Alcohol/week: 3.0 standard drinks    Types: 3 Standard drinks or equivalent per week    Comment: OCCASIONAL  . Drug use: No  . Sexual activity: Not on file

## 2018-11-02 NOTE — Progress Notes (Signed)
Patient ID: Jacob Gibbs, male   DOB: June 06, 1960, 58 y.o.   MRN: [id number]  Jacob Gibbs is 6 weeks status post right shoulder scope, biceps tenodesis, rotator cuff repair.  He is making progress and improving.  He is doing physical therapy 3 times a week.  Range of motion and pain are both improving.  He is mainly having trouble sleeping supine at night.  He is taking Tylenol for this. Surgical scars are fully healed.  Passive range of motion I can get him to just above the shoulder level.  External rotation to 40 degrees. Overall I do like that he is making good progress.  He can discontinue the sling at this point.  He will continue with outpatient physical therapy.  Recheck in 6 weeks.

## 2018-11-04 DIAGNOSIS — M25511 Pain in right shoulder: Secondary | ICD-10-CM | POA: Diagnosis not present

## 2018-11-04 DIAGNOSIS — M25611 Stiffness of right shoulder, not elsewhere classified: Secondary | ICD-10-CM | POA: Diagnosis not present

## 2018-11-04 DIAGNOSIS — M6281 Muscle weakness (generalized): Secondary | ICD-10-CM | POA: Diagnosis not present

## 2018-11-04 DIAGNOSIS — M25411 Effusion, right shoulder: Secondary | ICD-10-CM | POA: Diagnosis not present

## 2018-11-07 DIAGNOSIS — M25511 Pain in right shoulder: Secondary | ICD-10-CM | POA: Diagnosis not present

## 2018-11-07 DIAGNOSIS — M6281 Muscle weakness (generalized): Secondary | ICD-10-CM | POA: Diagnosis not present

## 2018-11-07 DIAGNOSIS — M25411 Effusion, right shoulder: Secondary | ICD-10-CM | POA: Diagnosis not present

## 2018-11-07 DIAGNOSIS — M25611 Stiffness of right shoulder, not elsewhere classified: Secondary | ICD-10-CM | POA: Diagnosis not present

## 2018-11-09 DIAGNOSIS — M25611 Stiffness of right shoulder, not elsewhere classified: Secondary | ICD-10-CM | POA: Diagnosis not present

## 2018-11-09 DIAGNOSIS — M25411 Effusion, right shoulder: Secondary | ICD-10-CM | POA: Diagnosis not present

## 2018-11-09 DIAGNOSIS — M6281 Muscle weakness (generalized): Secondary | ICD-10-CM | POA: Diagnosis not present

## 2018-11-09 DIAGNOSIS — M25511 Pain in right shoulder: Secondary | ICD-10-CM | POA: Diagnosis not present

## 2018-11-11 DIAGNOSIS — M25411 Effusion, right shoulder: Secondary | ICD-10-CM | POA: Diagnosis not present

## 2018-11-11 DIAGNOSIS — M25611 Stiffness of right shoulder, not elsewhere classified: Secondary | ICD-10-CM | POA: Diagnosis not present

## 2018-11-11 DIAGNOSIS — M25511 Pain in right shoulder: Secondary | ICD-10-CM | POA: Diagnosis not present

## 2018-11-11 DIAGNOSIS — M6281 Muscle weakness (generalized): Secondary | ICD-10-CM | POA: Diagnosis not present

## 2018-11-14 DIAGNOSIS — M25611 Stiffness of right shoulder, not elsewhere classified: Secondary | ICD-10-CM | POA: Diagnosis not present

## 2018-11-14 DIAGNOSIS — M25511 Pain in right shoulder: Secondary | ICD-10-CM | POA: Diagnosis not present

## 2018-11-14 DIAGNOSIS — M25411 Effusion, right shoulder: Secondary | ICD-10-CM | POA: Diagnosis not present

## 2018-11-14 DIAGNOSIS — M6281 Muscle weakness (generalized): Secondary | ICD-10-CM | POA: Diagnosis not present

## 2018-11-16 DIAGNOSIS — M25511 Pain in right shoulder: Secondary | ICD-10-CM | POA: Diagnosis not present

## 2018-11-16 DIAGNOSIS — M6281 Muscle weakness (generalized): Secondary | ICD-10-CM | POA: Diagnosis not present

## 2018-11-16 DIAGNOSIS — M25611 Stiffness of right shoulder, not elsewhere classified: Secondary | ICD-10-CM | POA: Diagnosis not present

## 2018-11-16 DIAGNOSIS — M25411 Effusion, right shoulder: Secondary | ICD-10-CM | POA: Diagnosis not present

## 2018-11-18 DIAGNOSIS — M25411 Effusion, right shoulder: Secondary | ICD-10-CM | POA: Diagnosis not present

## 2018-11-18 DIAGNOSIS — M6281 Muscle weakness (generalized): Secondary | ICD-10-CM | POA: Diagnosis not present

## 2018-11-18 DIAGNOSIS — M25611 Stiffness of right shoulder, not elsewhere classified: Secondary | ICD-10-CM | POA: Diagnosis not present

## 2018-11-18 DIAGNOSIS — M25511 Pain in right shoulder: Secondary | ICD-10-CM | POA: Diagnosis not present

## 2018-11-21 DIAGNOSIS — M25611 Stiffness of right shoulder, not elsewhere classified: Secondary | ICD-10-CM | POA: Diagnosis not present

## 2018-11-21 DIAGNOSIS — M25411 Effusion, right shoulder: Secondary | ICD-10-CM | POA: Diagnosis not present

## 2018-11-21 DIAGNOSIS — M25511 Pain in right shoulder: Secondary | ICD-10-CM | POA: Diagnosis not present

## 2018-11-21 DIAGNOSIS — M6281 Muscle weakness (generalized): Secondary | ICD-10-CM | POA: Diagnosis not present

## 2018-11-23 DIAGNOSIS — M25511 Pain in right shoulder: Secondary | ICD-10-CM | POA: Diagnosis not present

## 2018-11-23 DIAGNOSIS — M25411 Effusion, right shoulder: Secondary | ICD-10-CM | POA: Diagnosis not present

## 2018-11-23 DIAGNOSIS — M25611 Stiffness of right shoulder, not elsewhere classified: Secondary | ICD-10-CM | POA: Diagnosis not present

## 2018-11-23 DIAGNOSIS — M6281 Muscle weakness (generalized): Secondary | ICD-10-CM | POA: Diagnosis not present

## 2018-11-29 DIAGNOSIS — M25511 Pain in right shoulder: Secondary | ICD-10-CM | POA: Diagnosis not present

## 2018-11-29 DIAGNOSIS — M25411 Effusion, right shoulder: Secondary | ICD-10-CM | POA: Diagnosis not present

## 2018-11-29 DIAGNOSIS — M25611 Stiffness of right shoulder, not elsewhere classified: Secondary | ICD-10-CM | POA: Diagnosis not present

## 2018-11-29 DIAGNOSIS — M6281 Muscle weakness (generalized): Secondary | ICD-10-CM | POA: Diagnosis not present

## 2018-12-01 DIAGNOSIS — M25611 Stiffness of right shoulder, not elsewhere classified: Secondary | ICD-10-CM | POA: Diagnosis not present

## 2018-12-01 DIAGNOSIS — M25511 Pain in right shoulder: Secondary | ICD-10-CM | POA: Diagnosis not present

## 2018-12-01 DIAGNOSIS — M6281 Muscle weakness (generalized): Secondary | ICD-10-CM | POA: Diagnosis not present

## 2018-12-01 DIAGNOSIS — M25411 Effusion, right shoulder: Secondary | ICD-10-CM | POA: Diagnosis not present

## 2018-12-06 DIAGNOSIS — M25411 Effusion, right shoulder: Secondary | ICD-10-CM | POA: Diagnosis not present

## 2018-12-06 DIAGNOSIS — M25611 Stiffness of right shoulder, not elsewhere classified: Secondary | ICD-10-CM | POA: Diagnosis not present

## 2018-12-06 DIAGNOSIS — M6281 Muscle weakness (generalized): Secondary | ICD-10-CM | POA: Diagnosis not present

## 2018-12-06 DIAGNOSIS — M25511 Pain in right shoulder: Secondary | ICD-10-CM | POA: Diagnosis not present

## 2018-12-08 DIAGNOSIS — M25411 Effusion, right shoulder: Secondary | ICD-10-CM | POA: Diagnosis not present

## 2018-12-08 DIAGNOSIS — M25611 Stiffness of right shoulder, not elsewhere classified: Secondary | ICD-10-CM | POA: Diagnosis not present

## 2018-12-08 DIAGNOSIS — M6281 Muscle weakness (generalized): Secondary | ICD-10-CM | POA: Diagnosis not present

## 2018-12-08 DIAGNOSIS — M25511 Pain in right shoulder: Secondary | ICD-10-CM | POA: Diagnosis not present

## 2018-12-13 DIAGNOSIS — M25411 Effusion, right shoulder: Secondary | ICD-10-CM | POA: Diagnosis not present

## 2018-12-13 DIAGNOSIS — M25611 Stiffness of right shoulder, not elsewhere classified: Secondary | ICD-10-CM | POA: Diagnosis not present

## 2018-12-13 DIAGNOSIS — M25511 Pain in right shoulder: Secondary | ICD-10-CM | POA: Diagnosis not present

## 2018-12-13 DIAGNOSIS — M6281 Muscle weakness (generalized): Secondary | ICD-10-CM | POA: Diagnosis not present

## 2018-12-14 ENCOUNTER — Encounter: Payer: Self-pay | Admitting: Orthopaedic Surgery

## 2018-12-14 ENCOUNTER — Ambulatory Visit (INDEPENDENT_AMBULATORY_CARE_PROVIDER_SITE_OTHER): Payer: BC Managed Care – PPO | Admitting: Orthopaedic Surgery

## 2018-12-14 VITALS — Ht 69.0 in | Wt 225.0 lb

## 2018-12-14 DIAGNOSIS — Z9889 Other specified postprocedural states: Secondary | ICD-10-CM

## 2018-12-14 DIAGNOSIS — M75101 Unspecified rotator cuff tear or rupture of right shoulder, not specified as traumatic: Secondary | ICD-10-CM

## 2018-12-14 DIAGNOSIS — S43431A Superior glenoid labrum lesion of right shoulder, initial encounter: Secondary | ICD-10-CM

## 2018-12-14 NOTE — Progress Notes (Signed)
Post-Op Visit Note   Patient: Jacob Gibbs           Date of Birth: 03/21/1961           MRN: [id number] Visit Date: 12/14/2018 PCP: Marton Redwood, MD   Assessment & Plan:  Chief Complaint:  Chief Complaint  Patient presents with  . Right Shoulder - Follow-up    09/21/2018 Right Shoulder Arthroscopy with Extensive Debridement, Biceps Tenodesis, SAD, RCR   Visit Diagnoses:  1. S/P arthroscopy of right shoulder   2. Rotator cuff syndrome of right shoulder   3. Superior labrum anterior-to-posterior (SLAP) tear of right shoulder     Plan: Kiran is nearly 3 months status post right shoulder arthroscopy with rotator cuff repair and biceps tenodesis.  He states that he has about a couple weeks of PT left.  He has no pain and does not take any medicines.  Occasionally ices.  He has no real complaints.  Physical exam shows excellent range of motion without pain.  Surgical scars are fully healed.  Rotator cuff strength is normal to manual muscle testing.  At this point he can increase activity and return to sports as tolerated.  We discussed proper warm up and stretching before the activity.  I would like to recheck him in 2 months to see how he does with getting back into activity.  Follow-Up Instructions: Return in about 2 months (around 02/13/2019).   Orders:  No orders of the defined types were placed in this encounter.  No orders of the defined types were placed in this encounter.   Imaging: No results found.  PMFS History: Patient Active Problem List   Diagnosis Date Noted  . Impingement syndrome of right shoulder 09/21/2018  . Arthrosis of right acromioclavicular joint 09/21/2018  . Nontraumatic tear of supraspinatus tendon, right   . Rotator cuff syndrome of right shoulder 04/12/2018  . Superior labrum anterior-to-posterior (SLAP) tear of right shoulder 04/12/2018  . Coronary artery disease   . PAF (paroxysmal atrial fibrillation) ([place])   . Acute ST elevation  myocardial infarction (STEMI) of inferior wall ([place]) 02/24/2016  . HLD (hyperlipidemia) 08/19/2010   Past Medical History:  Diagnosis Date  . Anxiety   . Coronary artery disease    a. 2009: mutivessel CAD s/p CABG   b. 01/2016: inf STEMI s/p DES to RCA  . DDD (degenerative disc disease), cervical   . GERD (gastroesophageal reflux disease)   . History of tobacco use    REMOTE  . Hyperlipidemia   . PAF (paroxysmal atrial fibrillation) ([facility])    a. brief episode in dec 2005 wtih no recurrence. No indication for [place]  . Sleep apnea    uses cpap  . Syncope and collapse     Family History  Problem Relation Age of Onset  . Coronary artery disease Mother        ALSO ONE OF HIS UNCLES    Past Surgical History:  Procedure Laterality Date  . CARDIAC CATHETERIZATION N/A 02/24/2016   Procedure: Coronary Stent Intervention;  Surgeon: Sherren Mocha, MD;  Location: [facility];  Service: Cardiovascular;  Laterality: N/A;  . CARDIAC CATHETERIZATION N/A 02/24/2016   Procedure: Left Heart Cath and Cors/Grafts Angiography;  Surgeon: Sherren Mocha, MD;  Location: [facility];  Service: Cardiovascular;  Laterality: N/A;  . CORONARY ARTERY BYPASS GRAFT  02/06/08   X 3  . SHOULDER ARTHROSCOPY WITH ROTATOR CUFF REPAIR AND SUBACROMIAL DECOMPRESSION Right 09/21/2018   Procedure: RIGHT  SHOULDER ARTHROSCOPY WITH EXTENSIVE DEBRIDEMENT, BICEPS TENODESIS, SUBACROMIAL DECOMPRESSION,  ROTATOR CUFF REPAIR;  Surgeon: Tarry KosXu,  M, MD;  Location: [facility];  Service: Orthopedics;  Laterality: Right;   Social History   Occupational History  . Occupation: Firefighterinancial advisor  Tobacco Use  . Smoking status: Former Smoker    Packs/day: 0.50    Years: 20.00    Pack years: 10.00    Types: Cigarettes    Quit date: 01/07/2004    Years since quitting: 14.9  . Smokeless tobacco: Never Used  . Tobacco comment: Stopped 5250yrs ago  Substance and Sexual Activity  . Alcohol use: Yes     Alcohol/week: 3.0 standard drinks    Types: 3 Standard drinks or equivalent per week    Comment: OCCASIONAL  . Drug use: No  . Sexual activity: Not on file

## 2018-12-15 DIAGNOSIS — M25611 Stiffness of right shoulder, not elsewhere classified: Secondary | ICD-10-CM | POA: Diagnosis not present

## 2018-12-15 DIAGNOSIS — M25411 Effusion, right shoulder: Secondary | ICD-10-CM | POA: Diagnosis not present

## 2018-12-15 DIAGNOSIS — M6281 Muscle weakness (generalized): Secondary | ICD-10-CM | POA: Diagnosis not present

## 2018-12-15 DIAGNOSIS — M25511 Pain in right shoulder: Secondary | ICD-10-CM | POA: Diagnosis not present

## 2018-12-20 DIAGNOSIS — M25611 Stiffness of right shoulder, not elsewhere classified: Secondary | ICD-10-CM | POA: Diagnosis not present

## 2018-12-20 DIAGNOSIS — M25411 Effusion, right shoulder: Secondary | ICD-10-CM | POA: Diagnosis not present

## 2018-12-20 DIAGNOSIS — M25511 Pain in right shoulder: Secondary | ICD-10-CM | POA: Diagnosis not present

## 2018-12-20 DIAGNOSIS — M6281 Muscle weakness (generalized): Secondary | ICD-10-CM | POA: Diagnosis not present

## 2018-12-22 DIAGNOSIS — M25611 Stiffness of right shoulder, not elsewhere classified: Secondary | ICD-10-CM | POA: Diagnosis not present

## 2018-12-22 DIAGNOSIS — M25411 Effusion, right shoulder: Secondary | ICD-10-CM | POA: Diagnosis not present

## 2018-12-22 DIAGNOSIS — M25511 Pain in right shoulder: Secondary | ICD-10-CM | POA: Diagnosis not present

## 2018-12-22 DIAGNOSIS — M6281 Muscle weakness (generalized): Secondary | ICD-10-CM | POA: Diagnosis not present

## 2018-12-27 DIAGNOSIS — M6281 Muscle weakness (generalized): Secondary | ICD-10-CM | POA: Diagnosis not present

## 2018-12-27 DIAGNOSIS — M25411 Effusion, right shoulder: Secondary | ICD-10-CM | POA: Diagnosis not present

## 2018-12-27 DIAGNOSIS — M25611 Stiffness of right shoulder, not elsewhere classified: Secondary | ICD-10-CM | POA: Diagnosis not present

## 2018-12-27 DIAGNOSIS — M25511 Pain in right shoulder: Secondary | ICD-10-CM | POA: Diagnosis not present

## 2018-12-29 DIAGNOSIS — M25411 Effusion, right shoulder: Secondary | ICD-10-CM | POA: Diagnosis not present

## 2018-12-29 DIAGNOSIS — M6281 Muscle weakness (generalized): Secondary | ICD-10-CM | POA: Diagnosis not present

## 2018-12-29 DIAGNOSIS — M25511 Pain in right shoulder: Secondary | ICD-10-CM | POA: Diagnosis not present

## 2018-12-29 DIAGNOSIS — M25611 Stiffness of right shoulder, not elsewhere classified: Secondary | ICD-10-CM | POA: Diagnosis not present

## 2018-12-31 DIAGNOSIS — Z23 Encounter for immunization: Secondary | ICD-10-CM | POA: Diagnosis not present

## 2019-01-03 DIAGNOSIS — M25411 Effusion, right shoulder: Secondary | ICD-10-CM | POA: Diagnosis not present

## 2019-01-03 DIAGNOSIS — M25511 Pain in right shoulder: Secondary | ICD-10-CM | POA: Diagnosis not present

## 2019-01-03 DIAGNOSIS — M25611 Stiffness of right shoulder, not elsewhere classified: Secondary | ICD-10-CM | POA: Diagnosis not present

## 2019-01-03 DIAGNOSIS — M6281 Muscle weakness (generalized): Secondary | ICD-10-CM | POA: Diagnosis not present

## 2019-01-05 DIAGNOSIS — M6281 Muscle weakness (generalized): Secondary | ICD-10-CM | POA: Diagnosis not present

## 2019-01-05 DIAGNOSIS — M25611 Stiffness of right shoulder, not elsewhere classified: Secondary | ICD-10-CM | POA: Diagnosis not present

## 2019-01-05 DIAGNOSIS — M25411 Effusion, right shoulder: Secondary | ICD-10-CM | POA: Diagnosis not present

## 2019-01-05 DIAGNOSIS — M25511 Pain in right shoulder: Secondary | ICD-10-CM | POA: Diagnosis not present

## 2019-02-10 ENCOUNTER — Other Ambulatory Visit: Payer: Self-pay

## 2019-02-10 ENCOUNTER — Ambulatory Visit (INDEPENDENT_AMBULATORY_CARE_PROVIDER_SITE_OTHER): Payer: BC Managed Care – PPO | Admitting: Podiatry

## 2019-02-10 ENCOUNTER — Ambulatory Visit (INDEPENDENT_AMBULATORY_CARE_PROVIDER_SITE_OTHER): Payer: BC Managed Care – PPO

## 2019-02-10 ENCOUNTER — Encounter: Payer: Self-pay | Admitting: Podiatry

## 2019-02-10 DIAGNOSIS — M779 Enthesopathy, unspecified: Secondary | ICD-10-CM | POA: Diagnosis not present

## 2019-02-14 NOTE — Progress Notes (Signed)
Subjective:   Patient ID: Jacob Gibbs, male   DOB: 58 y.o.   MRN: [id number]   HPI Patient states he is had a lot of pain in his right forefoot and states it is been going on for several months now and he does not remember specific injury and he is tried to get more active after having had other surgery.  Patient does not smoke and likes to be active if possible   Review of Systems  All other systems reviewed and are negative.       Objective:  Physical Exam Vitals signs and nursing note reviewed.  Constitutional:      Appearance: He is well-developed.  Pulmonary:     Effort: Pulmonary effort is normal.  Musculoskeletal: Normal range of motion.  Skin:    General: Skin is warm.  Neurological:     Mental Status: He is alert.     Neurovascular status intact muscle strength found to be adequate range of motion within normal limits with acute inflammation of the fourth MPJ right that is painful when pressed and make shoe gear difficult.  This is been going on for a while and gotten worse recently and patient is found to have good digital perfusion well oriented x3    Assessment:  Acute capsulitis fourth MPJ right with pain     Plan:  H&P reviewed condition and recommended correction of deformity.  I explained procedure risk and patient wants procedure and I did sterile prep and aspirated the fourth MPJ getting a small amount of clear fluid and injected with quarter cc dexamethasone Kenalog and applied padding to reduce pressure on the joint with possibility for orthotics or other treatment in future  X-rays indicate no signs of stress fracture arthritis associated with this condition

## 2019-02-21 DIAGNOSIS — Z125 Encounter for screening for malignant neoplasm of prostate: Secondary | ICD-10-CM | POA: Diagnosis not present

## 2019-02-21 DIAGNOSIS — E7849 Other hyperlipidemia: Secondary | ICD-10-CM | POA: Diagnosis not present

## 2019-02-21 DIAGNOSIS — Z0000 Encounter for general adult medical examination without abnormal findings: Secondary | ICD-10-CM | POA: Diagnosis not present

## 2019-02-21 DIAGNOSIS — R7301 Impaired fasting glucose: Secondary | ICD-10-CM | POA: Diagnosis not present

## 2019-02-24 DIAGNOSIS — R82998 Other abnormal findings in urine: Secondary | ICD-10-CM | POA: Diagnosis not present

## 2019-02-28 DIAGNOSIS — R7301 Impaired fasting glucose: Secondary | ICD-10-CM | POA: Diagnosis not present

## 2019-02-28 DIAGNOSIS — E785 Hyperlipidemia, unspecified: Secondary | ICD-10-CM | POA: Diagnosis not present

## 2019-02-28 DIAGNOSIS — I25810 Atherosclerosis of coronary artery bypass graft(s) without angina pectoris: Secondary | ICD-10-CM | POA: Diagnosis not present

## 2019-02-28 DIAGNOSIS — Z1331 Encounter for screening for depression: Secondary | ICD-10-CM | POA: Diagnosis not present

## 2019-02-28 DIAGNOSIS — Z0000 Encounter for general adult medical examination without abnormal findings: Secondary | ICD-10-CM | POA: Diagnosis not present

## 2019-02-28 DIAGNOSIS — E8881 Metabolic syndrome: Secondary | ICD-10-CM | POA: Diagnosis not present

## 2019-03-01 ENCOUNTER — Ambulatory Visit: Payer: BC Managed Care – PPO | Admitting: Orthopaedic Surgery

## 2019-03-01 ENCOUNTER — Other Ambulatory Visit: Payer: Self-pay

## 2019-03-01 ENCOUNTER — Ambulatory Visit (INDEPENDENT_AMBULATORY_CARE_PROVIDER_SITE_OTHER): Payer: BC Managed Care – PPO | Admitting: Orthopaedic Surgery

## 2019-03-01 ENCOUNTER — Encounter: Payer: Self-pay | Admitting: Orthopaedic Surgery

## 2019-03-01 VITALS — Ht 69.0 in | Wt 208.0 lb

## 2019-03-01 DIAGNOSIS — S43431A Superior glenoid labrum lesion of right shoulder, initial encounter: Secondary | ICD-10-CM | POA: Diagnosis not present

## 2019-03-01 DIAGNOSIS — Z9889 Other specified postprocedural states: Secondary | ICD-10-CM | POA: Diagnosis not present

## 2019-03-01 NOTE — Progress Notes (Signed)
Office Visit Note   Patient: Jacob Gibbs           Date of Birth: 1961/02/06           MRN: [id number] Visit Date: 03/01/2019              Requested by: Martha Clan, MD [address] [place],  [place] [address] PCP: Martha Clan, MD   Assessment & Plan: Visit Diagnoses:  1. S/P arthroscopy of right shoulder   2. Superior labrum anterior-to-posterior (SLAP) tear of right shoulder     Plan: At this point Jacob Gibbs has done very well from the surgery.  He is released to activity as tolerated.  Questions encouraged and answered.  Follow-up as needed.  Follow-Up Instructions: Return if symptoms worsen or fail to improve.   Orders:  No orders of the defined types were placed in this encounter.  No orders of the defined types were placed in this encounter.     Procedures: No procedures performed   Clinical Data: No additional findings.   Subjective: Chief Complaint  Patient presents with  . Right Shoulder - Follow-up    Jacob Gibbs is 5 months status post rotator cuff repair and biceps tenodesis.  He is doing well and reports no pain.  He has not been very active in terms of racquet sports due to Covid.  He has no real complaints.   Review of Systems   Objective: Vital Signs: Ht 5\' 9"  (1.753 m)   Wt 208 lb (94.3 kg)   BMI 30.72 kg/m   Physical Exam  Ortho Exam Right shoulder exam shows nearly full range of motion without any pain.  Good rotator cuff strength. Specialty Comments:  No specialty comments available.  Imaging: No results found.   PMFS History: Patient Active Problem List   Diagnosis Date Noted  . Impingement syndrome of right shoulder 09/21/2018  . Arthrosis of right acromioclavicular joint 09/21/2018  . Nontraumatic tear of supraspinatus tendon, right   . Rotator cuff syndrome of right shoulder 04/12/2018  . Superior labrum anterior-to-posterior (SLAP) tear of right shoulder 04/12/2018  . Coronary artery disease   . PAF (paroxysmal atrial  fibrillation) ([facility])   . Acute ST elevation myocardial infarction (STEMI) of inferior wall ([facility]) 02/24/2016  . HLD (hyperlipidemia) 08/19/2010   Past Medical History:  Diagnosis Date  . Anxiety   . Coronary artery disease    a. 2009: mutivessel CAD s/p CABG   b. 01/2016: inf STEMI s/p DES to RCA  . DDD (degenerative disc disease), cervical   . GERD (gastroesophageal reflux disease)   . History of tobacco use    REMOTE  . Hyperlipidemia   . PAF (paroxysmal atrial fibrillation) ([facility])    a. brief episode in dec 2005 wtih no recurrence. No indication for [place]  . Sleep apnea    uses cpap  . Syncope and collapse     Family History  Problem Relation Age of Onset  . Coronary artery disease Mother        ALSO ONE OF HIS UNCLES    Past Surgical History:  Procedure Laterality Date  . CARDIAC CATHETERIZATION N/A 02/24/2016   Procedure: Coronary Stent Intervention;  Surgeon: 02/26/2016, MD;  Location: [facility];  Service: Cardiovascular;  Laterality: N/A;  . CARDIAC CATHETERIZATION N/A 02/24/2016   Procedure: Left Heart Cath and Cors/Grafts Angiography;  Surgeon: 02/26/2016, MD;  Location: [facility];  Service: Cardiovascular;  Laterality: N/A;  . CORONARY ARTERY  BYPASS GRAFT  02/06/08   X 3  . SHOULDER ARTHROSCOPY WITH ROTATOR CUFF REPAIR AND SUBACROMIAL DECOMPRESSION Right 09/21/2018   Procedure: RIGHT SHOULDER ARTHROSCOPY WITH EXTENSIVE DEBRIDEMENT, BICEPS TENODESIS, SUBACROMIAL DECOMPRESSION,  ROTATOR CUFF REPAIR;  Surgeon: Leandrew Koyanagi, MD;  Location: [facility];  Service: Orthopedics;  Laterality: Right;   Social History   Occupational History  . Occupation: Music therapist  Tobacco Use  . Smoking status: Former Smoker    Packs/day: 0.50    Years: 20.00    Pack years: 10.00    Types: Cigarettes    Quit date: 01/07/2004    Years since quitting: 15.1  . Smokeless tobacco: Never Used  . Tobacco comment: Stopped 76yrs ago  Substance and  Sexual Activity  . Alcohol use: Yes    Alcohol/week: 3.0 standard drinks    Types: 3 Standard drinks or equivalent per week    Comment: OCCASIONAL  . Drug use: No  . Sexual activity: Not on file

## 2019-03-03 ENCOUNTER — Other Ambulatory Visit: Payer: Self-pay | Admitting: Cardiovascular Disease

## 2019-03-03 MED ORDER — METOPROLOL TARTRATE 25 MG PO TABS: ORAL_TABLET | ORAL | 1 refills | Status: DC

## 2019-03-08 ENCOUNTER — Ambulatory Visit: Payer: BC Managed Care – PPO | Admitting: Orthopaedic Surgery

## 2019-03-13 DIAGNOSIS — E291 Testicular hypofunction: Secondary | ICD-10-CM | POA: Diagnosis not present

## 2019-03-13 DIAGNOSIS — Z125 Encounter for screening for malignant neoplasm of prostate: Secondary | ICD-10-CM | POA: Diagnosis not present

## 2019-03-17 DIAGNOSIS — E291 Testicular hypofunction: Secondary | ICD-10-CM | POA: Diagnosis not present

## 2019-03-17 DIAGNOSIS — N5201 Erectile dysfunction due to arterial insufficiency: Secondary | ICD-10-CM | POA: Diagnosis not present

## 2019-03-17 DIAGNOSIS — N481 Balanitis: Secondary | ICD-10-CM | POA: Diagnosis not present

## 2019-03-23 DIAGNOSIS — Z1212 Encounter for screening for malignant neoplasm of rectum: Secondary | ICD-10-CM | POA: Diagnosis not present

## 2019-05-09 NOTE — Progress Notes (Signed)
Chief Complaint  Jacob Gibbs presents with  . Follow-up    CAD   History of Present Illness: 59 yo male with history of paroxysmal atrial fibrillation, HLD and CAD s/p 3V CABG in 2009 here today for cardiac follow up. He underwent 3V CABG in 2009 (RIMA to LAD, LIMA to OM3, SVG to RCA). Echo December 2015 with normal LV size and function, mild valve disease. He was admitted to [facility] November 2017 via EMS with c/o left shoulder pain. Code STEMI called. Emergent cardiac cath with severe diffuse disease in the SVG to PDA. A drug eluting stent was placed in the native RCA. LVEF by LV gram 55% with subtle hypokinesis of the inferior wall.   He is here today for follow up. The Jacob Gibbs denies any chest pain, dyspnea, palpitations, lower extremity edema, orthopnea, PND, dizziness, near syncope or syncope.   Primary Care Physician: Martha Clan, MD  Past Medical History:  Diagnosis Date  . Anxiety   . Coronary artery disease    a. 2009: mutivessel CAD s/p CABG   b. 01/2016: inf STEMI s/p DES to RCA  . DDD (degenerative disc disease), cervical   . GERD (gastroesophageal reflux disease)   . History of tobacco use    REMOTE  . Hyperlipidemia   . PAF (paroxysmal atrial fibrillation) (HCC)    a. brief episode in dec 2005 wtih no recurrence. No indication for OAC  . Sleep apnea    uses cpap  . Syncope and collapse     Past Surgical History:  Procedure Laterality Date  . CARDIAC CATHETERIZATION N/A 02/24/2016   Procedure: Coronary Stent Intervention;  Surgeon: Tonny Bollman, MD;  Location: [facility] INVASIVE CV LAB;  Service: Cardiovascular;  Laterality: N/A;  . CARDIAC CATHETERIZATION N/A 02/24/2016   Procedure: Left Heart Cath and Cors/Grafts Angiography;  Surgeon: Tonny Bollman, MD;  Location: [facility] INVASIVE CV LAB;  Service: Cardiovascular;  Laterality: N/A;  . CORONARY ARTERY BYPASS GRAFT  02/06/08   X 3  . SHOULDER ARTHROSCOPY WITH ROTATOR CUFF REPAIR AND SUBACROMIAL DECOMPRESSION Right 09/21/2018    Procedure: RIGHT SHOULDER ARTHROSCOPY WITH EXTENSIVE DEBRIDEMENT, BICEPS TENODESIS, SUBACROMIAL DECOMPRESSION,  ROTATOR CUFF REPAIR;  Surgeon: Tarry Kos, MD;  Location: [facility];  Service: Orthopedics;  Laterality: Right;   Current Outpatient Medications  Medication Instructions  . ALPRAZolam (XANAX) 0.25 mg, Oral, Daily at bedtime  . clopidogrel (PLAVIX) 75 mg, Oral, Daily  . ezetimibe (ZETIA) 10 mg, Oral, Daily  . metoprolol tartrate (LOPRESSOR) 25 MG tablet TAKE ONE-HALF (1/2) TABLET TWICE A DAY  . nitroGLYCERIN (NITROSTAT) 0.4 mg, Sublingual, Every 5 min x3 PRN  . omega-3 acid ethyl esters (LOVAZA) 1 g capsule 1 capsule, Oral, 2 times daily  . rosuvastatin (CRESTOR) 10 mg, Oral, Daily  . testosterone cypionate (DEPOTESTOSTERONE CYPIONATE) 100 mg, Intramuscular, Every Tue    Allergies  Allergen Reactions  . Contrast Media [Iodinated Diagnostic Agents] Other (See Comments)    Face turned scaly  . Iodine Solution [Povidone Iodine] Other (See Comments)    Face turned scaly    Social History   Socioeconomic History  . Marital status: Married    Spouse name: Not on file  . Number of children: Not on file  . Years of education: Not on file  . Highest education level: Not on file  Occupational History  . Occupation: Firefighter  Tobacco Use  . Smoking status: Former Smoker    Packs/day: 0.50    Years: 20.00  Pack years: 10.00    Types: Cigarettes    Quit date: 01/07/2004    Years since quitting: 15.3  . Smokeless tobacco: Never Used  . Tobacco comment: Stopped 23yrs ago  Substance and Sexual Activity  . Alcohol use: Yes    Alcohol/week: 3.0 standard drinks    Types: 3 Standard drinks or equivalent per week    Comment: OCCASIONAL  . Drug use: No  . Sexual activity: Not on file  Other Topics Concern  . Not on file  Social History Narrative   MARRIED   TOBACCO USE ON AND OFF   ETOH OCCASIONALLY   USES HERBAL SUPP. ALONG WITH A DAILY VITAMIN  PACK   HAD STRESS TEST APPROX 5 YRS   NEW ONSET AFIB   SYNCOPE   Social Determinants of Health   Financial Resource Strain:   . Difficulty of Paying Living Expenses: Not on file  Food Insecurity:   . Worried About Charity fundraiser in the Last Year: Not on file  . Ran Out of Food in the Last Year: Not on file  Transportation Needs:   . Lack of Transportation (Medical): Not on file  . Lack of Transportation (Non-Medical): Not on file  Physical Activity:   . Days of Exercise per Week: Not on file  . Minutes of Exercise per Session: Not on file  Stress:   . Feeling of Stress : Not on file  Social Connections:   . Frequency of Communication with Friends and Family: Not on file  . Frequency of Social Gatherings with Friends and Family: Not on file  . Attends Religious Services: Not on file  . Active Member of Clubs or Organizations: Not on file  . Attends Archivist Meetings: Not on file  . Marital Status: Not on file  Intimate Partner Violence:   . Fear of Current or Ex-Partner: Not on file  . Emotionally Abused: Not on file  . Physically Abused: Not on file  . Sexually Abused: Not on file    Family History  Problem Relation Age of Onset  . Coronary artery disease Mother        ALSO ONE OF HIS UNCLES    Review of Systems:  As stated in the HPI and otherwise negative.   BP 110/70   Pulse 65   Ht 5\' 9"  (1.753 m)   Wt 203 lb (92.1 kg)   SpO2 98%   BMI 29.98 kg/m   Physical Examination:  General: Well developed, well nourished, NAD  HEENT: OP clear, mucus membranes moist  SKIN: warm, dry. No rashes. Neuro: No focal deficits  Musculoskeletal: Muscle strength 5/5 all ext  Psychiatric: Mood and affect normal  Neck: No JVD, no carotid bruits, no thyromegaly, no lymphadenopathy.  Lungs:Clear bilaterally, no wheezes, rhonci, crackles Cardiovascular: Regular rate and rhythm. No murmurs, gallops or rubs. Abdomen:Soft. Bowel sounds present. Non-tender.   Extremities: No lower extremity edema. Pulses are 2 + in the bilateral DP/PT.  Echo 03/12/14: Left ventricle: The cavity size was normal. Wall thickness was increased in a pattern of mild LVH. Systolic function was normal. The estimated ejection fraction was in the range of 55% to 60%. Although no diagnostic regional wall motion abnormality was identified, this possibility cannot be completely excluded on the basis of this study. Diastolic function was not fully assessed. - Aortic valve: There was no stenosis. - Aorta: Mildly dilated aortic root. Aortic root dimension: 37 mm (ED). - Mitral valve: Mildly calcified annulus. Mildly  calcified leaflets . There was trivial regurgitation. - Tricuspid valve: Peak RV-RA gradient (S): 24 mm Hg. - Pulmonary arteries: PA peak pressure: 27 mm Hg (S). - Inferior vena cava: The vessel was normal in size. The respirophasic diameter changes were in the normal range (= 50%), consistent with normal central venous pressure.  Impressions:  - Normal LV size with mild LV hypertrophy. EF 55-60%. Normal RV size and systolic function. No significant valvular abnormalities.   EKG:  EKG is  ordered today. The ekg ordered today demonstrates   Recent Labs: 09/19/2018: BUN 20; Creatinine, Ser 1.16; Potassium 4.1; Sodium 140   Lipid Panel    Component Value Date/Time   CHOL 102 04/27/2016 0907   TRIG 86 04/27/2016 0907   HDL 32 (L) 04/27/2016 0907   CHOLHDL 3.2 04/27/2016 0907   CHOLHDL 4.1 02/25/2016 0515   VLDL 13 02/25/2016 0515   LDLCALC 53 04/27/2016 0907     Wt Readings from Last 3 Encounters:  05/11/19 203 lb (92.1 kg)  03/01/19 208 lb (94.3 kg)  12/14/18 225 lb (102.1 kg)     Other studies Reviewed: Additional studies/ records that were reviewed today include: . Review of the above records demonstrates:    Assessment and Plan:   1. CAD s/p CABG without angina: No chest pain suggestive of angina. He had CABG in  2009 and had an inferior STEMI in November 2017. A drug eluting stent was placed in the native RCA. Continue Plavix, statin, Zetia and beta blocker.  Will stop ASA. Will arrange echo to assess LV systolic function.   2. Paroxysmal atrial fibrillation: Remote episode but no known recurrence. He is in sinus today.   3. HLD: LDL 54 on KPN form. Lipids followed in primary care. Continue statin and Zetia.    Current medicines are reviewed at length with the Jacob Gibbs today.  The Jacob Gibbs does not have concerns regarding medicines.  The following changes have been made:  no change  Labs/ tests ordered today include:   Orders Placed This Encounter  Procedures  . ECHOCARDIOGRAM COMPLETE   Disposition:   FU with me in 12  months   Signed, Verne Carrow, MD 05/11/2019 8:43 AM    [facility] [address] [place], [place], [place]  [address] Phone: [phone]; Fax: [phone]

## 2019-05-11 ENCOUNTER — Ambulatory Visit (INDEPENDENT_AMBULATORY_CARE_PROVIDER_SITE_OTHER): Payer: BC Managed Care – PPO | Admitting: Cardiovascular Disease

## 2019-05-11 ENCOUNTER — Encounter: Payer: Self-pay | Admitting: Cardiovascular Disease

## 2019-05-11 ENCOUNTER — Other Ambulatory Visit: Payer: Self-pay

## 2019-05-11 VITALS — BP 110/70 | HR 65 | Ht 69.0 in | Wt 203.0 lb

## 2019-05-11 DIAGNOSIS — E7800 Pure hypercholesterolemia, unspecified: Secondary | ICD-10-CM | POA: Diagnosis not present

## 2019-05-11 DIAGNOSIS — I25810 Atherosclerosis of coronary artery bypass graft(s) without angina pectoris: Secondary | ICD-10-CM | POA: Diagnosis not present

## 2019-05-11 DIAGNOSIS — I480 Paroxysmal atrial fibrillation: Secondary | ICD-10-CM | POA: Diagnosis not present

## 2019-05-11 NOTE — Patient Instructions (Signed)
Medication Instructions:  Stop aspirin *If you need a refill on your cardiac medications before your next appointment, please call your pharmacy*  Lab Work: none If you have labs (blood work) drawn today and your tests are completely normal, you will receive your results only by: Marland Kitchen MyChart Message (if you have MyChart) OR . A paper copy in the mail If you have any lab test that is abnormal or we need to change your treatment, we will call you to review the results.  Testing/Procedures: Your physician has requested that you have an echocardiogram. Echocardiography is a painless test that uses sound waves to create images of your heart. It provides your doctor with information about the size and shape of your heart and how well your heart's chambers and valves are working. This procedure takes approximately one hour. There are no restrictions for this procedure.   Follow-Up: At [facility], you and your health needs are our priority.  As part of our continuing mission to provide you with exceptional heart care, we have created designated Provider Care Teams.  These Care Teams include your primary Cardiologist (physician) and Advanced Practice Providers (APPs -  Physician Assistants and Nurse Practitioners) who all work together to provide you with the care you need, when you need it.  Your next appointment:   12 month(s)  The format for your next appointment:   Either In Person or Virtual  Provider:   You may see Verne Carrow, MD or one of the following Advanced Practice Providers on your designated Care Team:    Ronie Spies, PA-C  Jacolyn Reedy, PA-C   Other Instructions

## 2019-05-24 ENCOUNTER — Other Ambulatory Visit: Payer: Self-pay

## 2019-05-24 MED ORDER — ROSUVASTATIN CALCIUM 20 MG PO TABS: 10.0000 mg | ORAL_TABLET | Freq: Every day | ORAL | 3 refills | Status: DC

## 2019-05-25 ENCOUNTER — Other Ambulatory Visit: Payer: Self-pay

## 2019-05-25 ENCOUNTER — Ambulatory Visit (HOSPITAL_COMMUNITY): Payer: BC Managed Care – PPO | Attending: Cardiovascular Disease

## 2019-05-25 DIAGNOSIS — I25810 Atherosclerosis of coronary artery bypass graft(s) without angina pectoris: Secondary | ICD-10-CM | POA: Diagnosis not present

## 2019-05-25 DIAGNOSIS — E7800 Pure hypercholesterolemia, unspecified: Secondary | ICD-10-CM | POA: Diagnosis not present

## 2019-05-25 DIAGNOSIS — I480 Paroxysmal atrial fibrillation: Secondary | ICD-10-CM | POA: Diagnosis not present

## 2019-05-26 ENCOUNTER — Other Ambulatory Visit: Payer: Self-pay

## 2019-05-26 MED ORDER — CLOPIDOGREL BISULFATE 75 MG PO TABS: 75.0000 mg | ORAL_TABLET | Freq: Every day | ORAL | 3 refills | Status: DC

## 2019-05-26 MED ORDER — EZETIMIBE 10 MG PO TABS: 10.0000 mg | ORAL_TABLET | Freq: Every day | ORAL | 3 refills | Status: DC

## 2019-07-07 ENCOUNTER — Other Ambulatory Visit: Payer: Self-pay | Admitting: *Deleted

## 2019-07-07 MED ORDER — OMEGA-3-ACID ETHYL ESTERS 1 G PO CAPS: 1.0000 | ORAL_CAPSULE | Freq: Two times a day (BID) | ORAL | 2 refills | Status: DC

## 2019-08-21 ENCOUNTER — Other Ambulatory Visit: Payer: Self-pay

## 2019-08-21 MED ORDER — METOPROLOL TARTRATE 25 MG PO TABS: ORAL_TABLET | ORAL | 2 refills | Status: DC

## 2019-08-21 NOTE — Addendum Note (Signed)
Addended by: Margaret Pyle D on: 08/21/2019 03:19 PM   Modules accepted: Orders

## 2019-09-05 ENCOUNTER — Encounter: Payer: Self-pay | Admitting: Orthopaedic Surgery

## 2019-09-05 ENCOUNTER — Ambulatory Visit: Payer: Self-pay

## 2019-09-05 ENCOUNTER — Ambulatory Visit (INDEPENDENT_AMBULATORY_CARE_PROVIDER_SITE_OTHER): Payer: BC Managed Care – PPO | Admitting: Orthopaedic Surgery

## 2019-09-05 ENCOUNTER — Other Ambulatory Visit: Payer: Self-pay

## 2019-09-05 DIAGNOSIS — M25512 Pain in left shoulder: Secondary | ICD-10-CM | POA: Diagnosis not present

## 2019-09-05 NOTE — Progress Notes (Signed)
Office Visit Note   Patient: Jacob Gibbs           Date of Birth: 19-Jun-1960           MRN: [id number] Visit Date: 09/05/2019              Requested by: Marton Redwood, MD [address] [place],  [place] [address] PCP: Marton Redwood, MD   Assessment & Plan: Visit Diagnoses:  1. Left shoulder pain, unspecified chronicity     Plan: Impression is left proximal biceps tenosynovitis.  Based on discussion he would like to do a cortisone injection with Dr. Junius Roads under ultrasound.  He will also rest for the next couple weeks.  Activity as tolerated.  Follow-up as needed.  Follow-Up Instructions: Return if symptoms worsen or fail to improve.   Orders:  Orders Placed This Encounter  Procedures  . XR Shoulder Left   No orders of the defined types were placed in this encounter.     Procedures: No procedures performed   Clinical Data: No additional findings.   Subjective: Chief Complaint  Patient presents with  . Left Shoulder - Pain    Jacob Gibbs is a 59 year old gentleman who is well-known to me comes in for 6 weeks of left shoulder pain mainly anterior that is tender to touch.  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  He cannot take NSAIDs due to cardiac history.  Overall he has been doing very well he has lost another 30 pounds and he has been eating a lot more healthily.  He states that hurts him when he pulls down with the left arm.  Denies any injuries.   Review of Systems   Objective: Vital Signs: There were no vitals taken for this visit.  Physical Exam  Ortho Exam Left shoulder shows point tenderness anteriorly along the biceps tendon.  Negative Hawkins sign.  Manual muscle testing is normal with slight referred pain. Specialty Comments:  No specialty comments available.  Imaging: XR Shoulder Left  Result Date: 09/05/2019 No acute or structural abnormalities    PMFS History: Patient Active Problem List   Diagnosis Date Noted  .  Impingement syndrome of right shoulder 09/21/2018  . Arthrosis of right acromioclavicular joint 09/21/2018  . Nontraumatic tear of supraspinatus tendon, right   . Rotator cuff syndrome of right shoulder 04/12/2018  . Superior labrum anterior-to-posterior (SLAP) tear of right shoulder 04/12/2018  . Coronary artery disease   . PAF (paroxysmal atrial fibrillation) ([place])   . Acute ST elevation myocardial infarction (STEMI) of inferior wall ([place]) 02/24/2016  . HLD (hyperlipidemia) 08/19/2010   Past Medical History:  Diagnosis Date  . Anxiety   . Coronary artery disease    a. 2009: mutivessel CAD s/p CABG   b. 01/2016: inf STEMI s/p DES to RCA  . DDD (degenerative disc disease), cervical   . GERD (gastroesophageal reflux disease)   . History of tobacco use    REMOTE  . Hyperlipidemia   . PAF (paroxysmal atrial fibrillation) (HCC)    a. brief episode in dec 2005 wtih no recurrence. No indication for [place]  . Sleep apnea    uses cpap  . Syncope and collapse     Family History  Problem Relation Age of Onset  . Coronary artery disease Mother        ALSO ONE OF HIS UNCLES    Past Surgical History:  Procedure Laterality Date  . CARDIAC CATHETERIZATION N/A 02/24/2016  Procedure: Coronary Stent Intervention;  Surgeon: Tonny Bollman, MD;  Location: [facility] INVASIVE CV LAB;  Service: Cardiovascular;  Laterality: N/A;  . CARDIAC CATHETERIZATION N/A 02/24/2016   Procedure: Left Heart Cath and Cors/Grafts Angiography;  Surgeon: Tonny Bollman, MD;  Location: [facility] INVASIVE CV LAB;  Service: Cardiovascular;  Laterality: N/A;  . CORONARY ARTERY BYPASS GRAFT  02/06/08   X 3  . SHOULDER ARTHROSCOPY WITH ROTATOR CUFF REPAIR AND SUBACROMIAL DECOMPRESSION Right 09/21/2018   Procedure: RIGHT SHOULDER ARTHROSCOPY WITH EXTENSIVE DEBRIDEMENT, BICEPS TENODESIS, SUBACROMIAL DECOMPRESSION,  ROTATOR CUFF REPAIR;  Surgeon: Tarry Kos, MD;  Location:  SURGERY CENTER;  Service: Orthopedics;  Laterality: Right;    Social History   Occupational History  . Occupation: Firefighter  Tobacco Use  . Smoking status: Former Smoker    Packs/day: 0.50    Years: 20.00    Pack years: 10.00    Types: Cigarettes    Quit date: 01/07/2004    Years since quitting: 15.6  . Smokeless tobacco: Never Used  . Tobacco comment: Stopped 34yrs ago  Substance and Sexual Activity  . Alcohol use: Yes    Alcohol/week: 3.0 standard drinks    Types: 3 Standard drinks or equivalent per week    Comment: OCCASIONAL  . Drug use: No  . Sexual activity: Not on file

## 2019-09-05 NOTE — Progress Notes (Signed)
Subjective: Patient is here for ultrasound-guided left shoulder long head biceps tendon injection.  Objective: Point tender over the proximal long head biceps tendon.  Procedure: Ultrasound-guided left biceps injection: After sterile prep with alcohol, injected 5 cc 1% lidocaine without epinephrine and 40 mg methylprednisolone into the tendon sheath.  He had good but not complete relief during the immediate anesthetic phase.  Follow-up as directed.

## 2019-10-03 ENCOUNTER — Other Ambulatory Visit: Payer: Self-pay

## 2019-10-03 ENCOUNTER — Telehealth (INDEPENDENT_AMBULATORY_CARE_PROVIDER_SITE_OTHER): Payer: BC Managed Care – PPO | Admitting: Orthopaedic Surgery

## 2019-10-03 DIAGNOSIS — M25512 Pain in left shoulder: Secondary | ICD-10-CM

## 2019-10-03 NOTE — Addendum Note (Signed)
Addended by: Barbette Or on: 10/03/2019 11:41 AM   Modules accepted: Orders

## 2019-10-03 NOTE — Telephone Encounter (Signed)
MRI ordered

## 2019-10-03 NOTE — Telephone Encounter (Signed)
I spoke with the patient today over the phone and he states that his left shoulder pain has continued despite the injection which gave him about a day of relief.  At this point we will need to obtain an MR arthrogram of the left shoulder to rule out structural abnormalities.  We will get this ordered today for [facility].

## 2019-10-13 ENCOUNTER — Other Ambulatory Visit: Payer: Self-pay

## 2019-10-13 ENCOUNTER — Ambulatory Visit
Admission: RE | Admit: 2019-10-13 | Discharge: 2019-10-13 | Disposition: A | Discharge status: Home / Self Care | Payer: BC Managed Care – PPO | Source: Ambulatory Visit | Attending: Orthopaedic Surgery | Admitting: Orthopaedic Surgery

## 2019-10-13 DIAGNOSIS — M19012 Primary osteoarthritis, left shoulder: Secondary | ICD-10-CM | POA: Diagnosis not present

## 2019-10-13 DIAGNOSIS — M25512 Pain in left shoulder: Secondary | ICD-10-CM | POA: Diagnosis not present

## 2019-10-13 MED ORDER — IOPAMIDOL (ISOVUE-M 200) INJECTION 41%
15.0000 mL | Freq: Once | INTRAMUSCULAR | Status: AC
  Administered 2019-10-13: 15 mL via INTRA_ARTICULAR

## 2019-10-19 ENCOUNTER — Ambulatory Visit (INDEPENDENT_AMBULATORY_CARE_PROVIDER_SITE_OTHER): Payer: BC Managed Care – PPO | Admitting: Orthopaedic Surgery

## 2019-10-19 ENCOUNTER — Encounter: Payer: Self-pay | Admitting: Orthopaedic Surgery

## 2019-10-19 DIAGNOSIS — S43432A Superior glenoid labrum lesion of left shoulder, initial encounter: Secondary | ICD-10-CM | POA: Diagnosis not present

## 2019-10-22 NOTE — Progress Notes (Signed)
Office Visit Note   Patient: Jacob Gibbs           Date of Birth: June 11, 1960           MRN: [id number] Visit Date: 10/19/2019              Requested by: Martha Clan, MD [address] [place],  [place] [address] PCP: Martha Clan, MD   Assessment & Plan: Visit Diagnoses:  1. Superior labrum anterior-to-posterior (SLAP) tear of left shoulder     Plan: I reviewed the MRI of the left shoulder.  Based on my analysis I feel that he has a SLAP tear both clinically and radiographically.  After consideration of his options and the fact that he feels that this pain is bearable and not interested in undergoing another surgery and rehab he will treat this symptomatically and make accommodations for it.  Questions encouraged and answered.  Follow-up as needed.  Follow-Up Instructions: Return if symptoms worsen or fail to improve.   Orders:  No orders of the defined types were placed in this encounter.  No orders of the defined types were placed in this encounter.     Procedures: No procedures performed   Clinical Data: No additional findings.   Subjective: Chief Complaint  Patient presents with  . Left Shoulder - Pain    Brain returns today for left shoulder MRI review.  He reports no changes.   Review of Systems  Constitutional: Negative.   All other systems reviewed and are negative.    Objective: Vital Signs: There were no vitals taken for this visit.  Physical Exam  Ortho Exam Left shoulder reveals exquisitely positive crank test and positive O'Brien.  Rotator cuff is normal to manual muscle testing. Specialty Comments:  No specialty comments available.  Imaging: No results found.   PMFS History: Patient Active Problem List   Diagnosis Date Noted  . Impingement syndrome of right shoulder 09/21/2018  . Arthrosis of right acromioclavicular joint 09/21/2018  . Nontraumatic tear of supraspinatus tendon, right   . Rotator cuff syndrome of right  shoulder 04/12/2018  . Superior labrum anterior-to-posterior (SLAP) tear of right shoulder 04/12/2018  . Coronary artery disease   . PAF (paroxysmal atrial fibrillation) (HCC)   . Acute ST elevation myocardial infarction (STEMI) of inferior wall (HCC) 02/24/2016  . HLD (hyperlipidemia) 08/19/2010   Past Medical History:  Diagnosis Date  . Anxiety   . Coronary artery disease    a. 2009: mutivessel CAD s/p CABG   b. 01/2016: inf STEMI s/p DES to RCA  . DDD (degenerative disc disease), cervical   . GERD (gastroesophageal reflux disease)   . History of tobacco use    REMOTE  . Hyperlipidemia   . PAF (paroxysmal atrial fibrillation) (HCC)    a. brief episode in dec 2005 wtih no recurrence. No indication for OAC  . Sleep apnea    uses cpap  . Syncope and collapse     Family History  Problem Relation Age of Onset  . Coronary artery disease Mother        ALSO ONE OF HIS UNCLES    Past Surgical History:  Procedure Laterality Date  . CARDIAC CATHETERIZATION N/A 02/24/2016   Procedure: Coronary Stent Intervention;  Surgeon: Tonny Bollman, MD;  Location: [facility] INVASIVE CV LAB;  Service: Cardiovascular;  Laterality: N/A;  . CARDIAC CATHETERIZATION N/A 02/24/2016   Procedure: Left Heart Cath and Cors/Grafts Angiography;  Surgeon: Tonny Bollman, MD;  Location: [facility] INVASIVE CV LAB;  Service: Cardiovascular;  Laterality: N/A;  . CORONARY ARTERY BYPASS GRAFT  02/06/08   X 3  . SHOULDER ARTHROSCOPY WITH ROTATOR CUFF REPAIR AND SUBACROMIAL DECOMPRESSION Right 09/21/2018   Procedure: RIGHT SHOULDER ARTHROSCOPY WITH EXTENSIVE DEBRIDEMENT, BICEPS TENODESIS, SUBACROMIAL DECOMPRESSION,  ROTATOR CUFF REPAIR;  Surgeon: Tarry Kos, MD;  Location: [facility];  Service: Orthopedics;  Laterality: Right;   Social History   Occupational History  . Occupation: Firefighter  Tobacco Use  . Smoking status: Former Smoker    Packs/day: 0.50    Years: 20.00    Pack years: 10.00    Types:  Cigarettes    Quit date: 01/07/2004    Years since quitting: 15.8  . Smokeless tobacco: Never Used  . Tobacco comment: Stopped 71yrs ago  Substance and Sexual Activity  . Alcohol use: Yes    Alcohol/week: 3.0 standard drinks    Types: 3 Standard drinks or equivalent per week    Comment: OCCASIONAL  . Drug use: No  . Sexual activity: Not on file

## 2019-10-24 ENCOUNTER — Encounter: Payer: Self-pay | Admitting: Family Medicine

## 2019-10-24 ENCOUNTER — Ambulatory Visit (INDEPENDENT_AMBULATORY_CARE_PROVIDER_SITE_OTHER): Payer: BC Managed Care – PPO | Admitting: Family Medicine

## 2019-10-24 ENCOUNTER — Ambulatory Visit: Payer: Self-pay

## 2019-10-24 ENCOUNTER — Other Ambulatory Visit: Payer: Self-pay

## 2019-10-24 DIAGNOSIS — S43432A Superior glenoid labrum lesion of left shoulder, initial encounter: Secondary | ICD-10-CM | POA: Diagnosis not present

## 2019-10-24 NOTE — Progress Notes (Signed)
Subjective: Patient is here for ultrasound-guided intra-articular left glenohumeral injection.  He has a SLAP tear and is hoping to avoid surgery.  Objective: Full range of motion but pain at the extremes.  Procedure: Ultrasound-guided left glenohumeral injection: After sterile prep with Betadine, injected 8 cc 1% lidocaine without epinephrine and 40 mg methylprednisolone using a 22-gauge spinal needle, passing the needle from posterior approach into the glenohumeral joint.  Injectate was seen filling the joint capsule.  Follow-up as directed.

## 2020-02-02 DIAGNOSIS — Z23 Encounter for immunization: Secondary | ICD-10-CM | POA: Diagnosis not present

## 2020-02-19 ENCOUNTER — Other Ambulatory Visit: Payer: Self-pay

## 2020-02-23 DIAGNOSIS — E291 Testicular hypofunction: Secondary | ICD-10-CM | POA: Diagnosis not present

## 2020-02-27 DIAGNOSIS — E785 Hyperlipidemia, unspecified: Secondary | ICD-10-CM | POA: Diagnosis not present

## 2020-02-27 DIAGNOSIS — Z125 Encounter for screening for malignant neoplasm of prostate: Secondary | ICD-10-CM | POA: Diagnosis not present

## 2020-02-27 DIAGNOSIS — R7301 Impaired fasting glucose: Secondary | ICD-10-CM | POA: Diagnosis not present

## 2020-03-01 DIAGNOSIS — E8881 Metabolic syndrome: Secondary | ICD-10-CM | POA: Diagnosis not present

## 2020-03-01 DIAGNOSIS — Z1389 Encounter for screening for other disorder: Secondary | ICD-10-CM | POA: Diagnosis not present

## 2020-03-01 DIAGNOSIS — Z0000 Encounter for general adult medical examination without abnormal findings: Secondary | ICD-10-CM | POA: Diagnosis not present

## 2020-03-01 DIAGNOSIS — R17 Unspecified jaundice: Secondary | ICD-10-CM | POA: Diagnosis not present

## 2020-03-01 DIAGNOSIS — Z1331 Encounter for screening for depression: Secondary | ICD-10-CM | POA: Diagnosis not present

## 2020-03-12 ENCOUNTER — Telehealth: Payer: Self-pay | Admitting: *Deleted

## 2020-03-12 NOTE — Telephone Encounter (Signed)
Dr. Clifton James,   Your recommendations for holding Plavix. On as monotherapy.  Remote CABG STEMI 2017 with DES to RCA.   Seeing for pre op visit tomorrow.   Thanks, Lawson Fiscal

## 2020-03-12 NOTE — Telephone Encounter (Signed)
   Primary Cardiologist: Verne Carrow, MD  Chart reviewed as part of pre-operative protocol coverage. Because of Jacob Gibbs past medical history and time since last visit, he will require a follow-up visit in order to better assess preoperative cardiovascular risk.  Pre-op covering staff: - Please schedule appointment and call patient to inform them. If patient already had an upcoming appointment within acceptable timeframe, please add "pre-op clearance" to the appointment notes so provider is aware. - Please contact requesting surgeon's office via preferred method (i.e, phone, fax) to inform them of need for appointment prior to surgery.   Surgery scheduled for 03/27/20. Pt is on plavix, will need appt before 03/20/20. Please check [facility] providers.  If applicable, this message will also be routed to pharmacy pool and/or primary cardiologist for input on holding anticoagulant/antiplatelet agent as requested below so that this information is available to the clearing provider at time of patient's appointment.   Jacob Rutherford Timberlyn Pickford, PA  03/12/2020, 12:23 PM

## 2020-03-12 NOTE — Telephone Encounter (Signed)
I s/w the pt and informed him he will need a pre op appt for clearance. Per pre op team pt will need appt before 03/20/20. I did offer 12/21 in the [facility] office though the pt will be out of town that day. Pt asked to please keep the appt tomorrow 03/13/20 for pre op. I will forward notes to Norma Fredrickson, NP for pre op clearance. Will remove from the pre op call back pool. Pt thanked me for all the help our office has done to to help out so his surgery is not delayed.

## 2020-03-12 NOTE — Telephone Encounter (Signed)
Needs appt asap

## 2020-03-12 NOTE — Progress Notes (Signed)
CARDIOLOGY OFFICE NOTE  Date:  03/13/2020    Jacob Gibbs Date of Birth: 1961-01-31 Medical Record #[id number]  PCP:  Martha Clan, MD  Cardiologist:  [facility]  Chief Complaint  Patient presents with  . Pre-op Exam    Seen for Dr. Clifton James    History of Present Illness: Jacob Gibbs is a 59 y.o. male who presents today for a urgent pre op clearance visit. Seen for Dr. Clifton James.   He has a history of PAF, HLD and CAD with prior 3V CABG from 2009 with RIMA to LAD, LIMA to OM3, & SVG to RCA.   He was admitted in November 2017 with Code STEMI. Emergent cardiac cath with severe diffuse disease in the SVG to PDA. A DES was placed in the native RCA. LVEF by LV gram 55% with subtle hypokinesis of the inferior wall.   Last seen in February by Dr. Clifton James and felt to be doing well. Echo was updated - his EF is normal. He was continued on Plavix as monotherapy.    Comes in today. Here alone. Needing shoulder arthroscopy. Planned for later this month. On Plavix. Dr. Clifton James has given the ok to hold. Lemon is doing well. No chest pain. Not short of breath. Has stopped tennis due to his shoulder but walking daily. No syncope. Feels good. He has had recent labs by Dr. Clelia Croft - HGB had drifted up per his report - he went and donated a unit of blood (also on testosterone) - for recheck next month and seeing GU as well.   Past Medical History:  Diagnosis Date  . Anxiety   . Coronary artery disease    a. 2009: mutivessel CAD s/p CABG   b. 01/2016: inf STEMI s/p DES to RCA  . DDD (degenerative disc disease), cervical   . GERD (gastroesophageal reflux disease)   . History of tobacco use    REMOTE  . Hyperlipidemia   . PAF (paroxysmal atrial fibrillation) (HCC)    a. brief episode in dec 2005 wtih no recurrence. No indication for OAC  . Sleep apnea    uses cpap  . Syncope and collapse     Past Surgical History:  Procedure Laterality Date  . CARDIAC CATHETERIZATION N/A  02/24/2016   Procedure: Coronary Stent Intervention;  Surgeon: Tonny Bollman, MD;  Location: [facility] INVASIVE CV LAB;  Service: Cardiovascular;  Laterality: N/A;  . CARDIAC CATHETERIZATION N/A 02/24/2016   Procedure: Left Heart Cath and Cors/Grafts Angiography;  Surgeon: Tonny Bollman, MD;  Location: [facility] INVASIVE CV LAB;  Service: Cardiovascular;  Laterality: N/A;  . CORONARY ARTERY BYPASS GRAFT  02/06/08   X 3  . SHOULDER ARTHROSCOPY WITH ROTATOR CUFF REPAIR AND SUBACROMIAL DECOMPRESSION Right 09/21/2018   Procedure: RIGHT SHOULDER ARTHROSCOPY WITH EXTENSIVE DEBRIDEMENT, BICEPS TENODESIS, SUBACROMIAL DECOMPRESSION,  ROTATOR CUFF REPAIR;  Surgeon: Tarry Kos, MD;  Location: [facility];  Service: Orthopedics;  Laterality: Right;     Medications: Current Meds  Medication Sig  . ALPRAZolam (XANAX) 0.5 MG tablet Take 0.25 mg by mouth at bedtime.   . clopidogrel (PLAVIX) 75 MG tablet Take 1 tablet (75 mg total) by mouth daily.  Marland Kitchen ezetimibe (ZETIA) 10 MG tablet Take 1 tablet (10 mg total) by mouth daily.  . metoprolol tartrate (LOPRESSOR) 25 MG tablet TAKE ONE-HALF (1/2) TABLET TWICE A DAY  . omega-3 acid ethyl esters (LOVAZA) 1 g capsule Take 1 capsule (1 g total) by mouth 2 (two) times daily.  Marland Kitchen  rosuvastatin (CRESTOR) 20 MG tablet Take 0.5 tablets (10 mg total) by mouth daily.  Marland Kitchen testosterone cypionate (DEPOTESTOSTERONE CYPIONATE) 200 MG/ML injection Inject 100 mg into the muscle every Tuesday.  . [DISCONTINUED] nitroGLYCERIN (NITROSTAT) 0.4 MG SL tablet Place 1 tablet (0.4 mg total) under the tongue every 5 (five) minutes x 3 doses as needed for chest pain.     Allergies: Allergies  Allergen Reactions  . Contrast Media [Iodinated Diagnostic Agents] Other (See Comments)    Face turned scaly (1989 CT).  Had MR arthrogram 03/07/18 (w/ CT contrast) with no prep and no problems.    Social History: The patient  reports that he quit smoking about 16 years ago. His smoking use  included cigarettes. He has a 10.00 pack-year smoking history. He has never used smokeless tobacco. He reports current alcohol use of about 3.0 standard drinks of alcohol per week. He reports that he does not use drugs.   Family History: The patient's family history includes Coronary artery disease in his mother.   Review of Systems: Please see the history of present illness.   All other systems are reviewed and negative.   Physical Exam: VS:  BP 126/68   Pulse 71   Ht 5\' 9"  (1.753 m)   Wt 203 lb (92.1 kg)   SpO2 97%   BMI 29.98 kg/m  .  BMI Body mass index is 29.98 kg/m.  Wt Readings from Last 3 Encounters:  03/13/20 203 lb (92.1 kg)  05/11/19 203 lb (92.1 kg)  03/01/19 208 lb (94.3 kg)    General: Pleasant. Alert and in no acute distress.   HEENT: Normal.  Neck: Supple, no JVD, carotid bruits, or masses noted.  Cardiac: Regular rate and rhythm. No murmurs, rubs, or gallops. No edema.  Respiratory:  Lungs are clear to auscultation bilaterally with normal work of breathing.  GI: Soft and nontender.  MS: No deformity or atrophy. Gait and ROM intact.  Skin: Warm and dry. Color is normal.  Neuro:  Strength and sensation are intact and no gross focal deficits noted.  Psych: Alert, appropriate and with normal affect.   LABORATORY DATA:  EKG:  EKG is ordered today.  Personally reviewed by me. This demonstrates NSR with incomplete RBBB.  Lab Results  Component Value Date   WBC 13.0 (H) 10/28/2016   HGB 17.5 (H) 10/28/2016   HCT 51.1 10/28/2016   PLT 190 10/28/2016   GLUCOSE 100 (H) 09/19/2018   CHOL 102 04/27/2016   TRIG 86 04/27/2016   HDL 32 (L) 04/27/2016   LDLCALC 53 04/27/2016   ALT 38 04/27/2016   AST 21 04/27/2016   NA 140 09/19/2018   K 4.1 09/19/2018   CL 106 09/19/2018   CREATININE 1.16 09/19/2018   BUN 20 09/19/2018   CO2 26 09/19/2018   INR 0.98 02/24/2016   HGBA1C 5.4 02/24/2016       BNP (last 3 results) No results for input(s): BNP in the  last 8760 hours.  ProBNP (last 3 results) No results for input(s): PROBNP in the last 8760 hours.   Other Studies Reviewed Today:   ECHO IMPRESSIONS 05/2019  1. Left ventricular ejection fraction, by estimation, is 60 to 65%. Left  ventricular ejection fraction by 3D volume is 61 %. The left ventricle has  normal function. The left ventricle has no regional wall motion  abnormalities. Left ventricular diastolic  parameters were normal.  2. Right ventricular systolic function is normal. The right ventricular  size is normal.  There is normal pulmonary artery systolic pressure. The  estimated right ventricular systolic pressure is 25.3 mmHg.  3. The mitral valve is grossly normal. Mild mitral valve regurgitation.  No evidence of mitral stenosis.  4. The aortic valve is tricuspid. Aortic valve regurgitation is not  visualized. No aortic stenosis is present.  5. The inferior vena cava is normal in size with greater than 50%  respiratory variability, suggesting right atrial pressure of 3 mmHg.   Comparison(s): A prior study was performed on 03/12/2014. Prior images  unable to be directly viewed, comparison made by report only. No  significant change from prior study.     Coronary Stent Intervention 01/2016  Left Heart Cath and Cors/Grafts Angiography    1. Severe three-vessel coronary artery disease with severe proximal LAD stenosis, total occlusion of the third OM branch of the circumflex, and severe native RCA stenosis 2. Status post CABG with continued patency of the LIMA to OM 3, RIMA to LAD, and SVG to distal RCA 3. Severe diffuse vein graft disease of the SVG to distal RCA 4. Successful native vessel PCI of the right coronary artery using a single drug-eluting stent 5. Mild segmental contraction abnormality the left ventricle with preserved overall LVEF  Recommendations: Aggressive post MI medical therapy, M.D. aPTT with aspirin and brilinta for at least 12  months.    Assessment and Plan:    1. Pre op clearance for shoulder surgery - he is felt to be an acceptable candidate for his surgery - he can complete over 4 mets of activity. He has no cardiac complaints. Will be available as needed. Plavix can be held as needed per Dr. Clifton James.   2. CAD with remote CABG and prior DES to native RCA from 2017 - doing well.   3. PAF - remote episode - remains in sinus.   4. HTN - BP is great - no changes made.   5. HLD - on statin - recent labs done by PCP.   Current medicines are reviewed with the patient today.  The patient does not have concerns regarding medicines other than what has been noted above.  The following changes have been made:  See above.  Labs/ tests ordered today include:    Orders Placed This Encounter  Procedures  . EKG 12-Lead     Disposition:   FU with Dr. Clifton James per recall.    Patient is agreeable to this plan and will call if any problems develop in the interim.   SignedNorma Fredrickson, NP  03/13/2020 1:30 PM  [facility] [address] [place], [place]  [address] Phone: [phone] Fax: [phone]

## 2020-03-12 NOTE — Telephone Encounter (Signed)
   [facility] Pre-operative Risk Assessment    HEARTCARE STAFF: - Please ensure there is not already an duplicate clearance open for this procedure. - Under Visit Info/Reason for Call, type in Other and utilize the format Clearance MM/DD/YY or Clearance TBD. Do not use dashes or single digits. - If request is for dental extraction, please clarify the # of teeth to be extracted.  Request for surgical clearance:  1. What type of surgery is being performed? SHOULDER ARTHROSCOPY   2. When is this surgery scheduled? 03/27/20   3. What type of clearance is required (medical clearance vs. Pharmacy clearance to hold med vs. Both)? MEDICAL  4. Are there any medications that need to be held prior to surgery and how long? PLAVIX   5. Practice name and name of physician performing surgery? [facility]; DR. Eduard Roux   6. What is the office phone number? [phone]   7.   What is the office fax number? [place].   Anesthesia type (None, local, MAC, general) ? GENERAL AND BLOCK   Jacob Gibbs 03/12/2020, 8:29 AM  _________________________________________________________________   (provider comments below)

## 2020-03-13 ENCOUNTER — Other Ambulatory Visit: Payer: Self-pay

## 2020-03-13 ENCOUNTER — Ambulatory Visit (INDEPENDENT_AMBULATORY_CARE_PROVIDER_SITE_OTHER): Payer: BC Managed Care – PPO | Admitting: Nurse Practitioner

## 2020-03-13 ENCOUNTER — Encounter: Payer: Self-pay | Admitting: Nurse Practitioner

## 2020-03-13 VITALS — BP 126/68 | HR 71 | Ht 69.0 in | Wt 203.0 lb

## 2020-03-13 DIAGNOSIS — I25810 Atherosclerosis of coronary artery bypass graft(s) without angina pectoris: Secondary | ICD-10-CM | POA: Diagnosis not present

## 2020-03-13 DIAGNOSIS — Z01810 Encounter for preprocedural cardiovascular examination: Secondary | ICD-10-CM

## 2020-03-13 DIAGNOSIS — E7800 Pure hypercholesterolemia, unspecified: Secondary | ICD-10-CM

## 2020-03-13 DIAGNOSIS — I10 Essential (primary) hypertension: Secondary | ICD-10-CM | POA: Diagnosis not present

## 2020-03-13 MED ORDER — NITROGLYCERIN 0.4 MG SL SUBL: 0.4000 mg | SUBLINGUAL_TABLET | SUBLINGUAL | 3 refills | Status: DC | PRN

## 2020-03-13 NOTE — Telephone Encounter (Signed)
OK to hold Plavix. Chris 

## 2020-03-13 NOTE — Telephone Encounter (Signed)
Faxed clearance.

## 2020-03-13 NOTE — Patient Instructions (Addendum)
After Visit Summary:  We will be checking the following labs today - NONE   Medication Instructions:    Continue with your current medicines.   You may hold your Plavix for 7 days prior to your surgery   If you need a refill on your cardiac medications before your next appointment, please call your pharmacy.     Testing/Procedures To Be Arranged:  N/A  Follow-Up:   See Dr. Clifton James in February per recall.     At [facility], you and your health needs are our priority.  As part of our continuing mission to provide you with exceptional heart care, we have created designated Provider Care Teams.  These Care Teams include your primary Cardiologist (physician) and Advanced Practice Providers (APPs -  Physician Assistants and Nurse Practitioners) who all work together to provide you with the care you need, when you need it.  Special Instructions:  . Stay safe, wash your hands for at least 20 seconds and wear a mask when needed.  . It was good to talk with you today.    Call the [facility] office at [phone] if you have any questions, problems or concerns.

## 2020-03-18 ENCOUNTER — Encounter (HOSPITAL_BASED_OUTPATIENT_CLINIC_OR_DEPARTMENT_OTHER): Payer: Self-pay | Admitting: Orthopaedic Surgery

## 2020-03-18 ENCOUNTER — Other Ambulatory Visit: Payer: Self-pay

## 2020-03-22 ENCOUNTER — Other Ambulatory Visit: Payer: Self-pay | Admitting: Cardiovascular Disease

## 2020-03-26 ENCOUNTER — Other Ambulatory Visit (HOSPITAL_COMMUNITY)
Admission: RE | Admit: 2020-03-26 | Discharge: 2020-03-26 | Disposition: A | Discharge status: Home / Self Care | Payer: BC Managed Care – PPO | Source: Ambulatory Visit | Attending: Orthopaedic Surgery | Admitting: Orthopaedic Surgery

## 2020-03-26 DIAGNOSIS — Z01812 Encounter for preprocedural laboratory examination: Secondary | ICD-10-CM | POA: Insufficient documentation

## 2020-03-26 DIAGNOSIS — Z20822 Contact with and (suspected) exposure to covid-19: Secondary | ICD-10-CM | POA: Insufficient documentation

## 2020-03-26 DIAGNOSIS — I2510 Atherosclerotic heart disease of native coronary artery without angina pectoris: Secondary | ICD-10-CM | POA: Diagnosis not present

## 2020-03-26 DIAGNOSIS — M7542 Impingement syndrome of left shoulder: Secondary | ICD-10-CM | POA: Diagnosis not present

## 2020-03-26 DIAGNOSIS — Z8249 Family history of ischemic heart disease and other diseases of the circulatory system: Secondary | ICD-10-CM | POA: Diagnosis not present

## 2020-03-26 DIAGNOSIS — Z951 Presence of aortocoronary bypass graft: Secondary | ICD-10-CM | POA: Diagnosis not present

## 2020-03-26 DIAGNOSIS — M7502 Adhesive capsulitis of left shoulder: Secondary | ICD-10-CM | POA: Diagnosis not present

## 2020-03-26 DIAGNOSIS — M94212 Chondromalacia, left shoulder: Secondary | ICD-10-CM | POA: Diagnosis not present

## 2020-03-26 DIAGNOSIS — Z955 Presence of coronary angioplasty implant and graft: Secondary | ICD-10-CM | POA: Diagnosis not present

## 2020-03-26 DIAGNOSIS — I252 Old myocardial infarction: Secondary | ICD-10-CM | POA: Diagnosis not present

## 2020-03-26 DIAGNOSIS — I480 Paroxysmal atrial fibrillation: Secondary | ICD-10-CM | POA: Diagnosis not present

## 2020-03-26 DIAGNOSIS — Z91041 Radiographic dye allergy status: Secondary | ICD-10-CM | POA: Diagnosis not present

## 2020-03-26 DIAGNOSIS — Z79899 Other long term (current) drug therapy: Secondary | ICD-10-CM | POA: Diagnosis not present

## 2020-03-26 DIAGNOSIS — S43492A Other sprain of left shoulder joint, initial encounter: Secondary | ICD-10-CM | POA: Diagnosis not present

## 2020-03-26 DIAGNOSIS — Z87891 Personal history of nicotine dependence: Secondary | ICD-10-CM | POA: Diagnosis not present

## 2020-03-26 DIAGNOSIS — X58XXXA Exposure to other specified factors, initial encounter: Secondary | ICD-10-CM | POA: Diagnosis not present

## 2020-03-26 DIAGNOSIS — S43432A Superior glenoid labrum lesion of left shoulder, initial encounter: Secondary | ICD-10-CM | POA: Diagnosis not present

## 2020-03-26 NOTE — Progress Notes (Signed)

## 2020-03-27 ENCOUNTER — Ambulatory Visit (HOSPITAL_BASED_OUTPATIENT_CLINIC_OR_DEPARTMENT_OTHER)
Admission: RE | Admit: 2020-03-27 | Discharge: 2020-03-27 | Disposition: A | Discharge status: Home / Self Care | Payer: BC Managed Care – PPO | Source: Ambulatory Visit | Attending: Orthopaedic Surgery | Admitting: Orthopaedic Surgery

## 2020-03-27 ENCOUNTER — Encounter: Payer: Self-pay | Admitting: Orthopaedic Surgery

## 2020-03-27 ENCOUNTER — Ambulatory Visit (HOSPITAL_BASED_OUTPATIENT_CLINIC_OR_DEPARTMENT_OTHER): Payer: BC Managed Care – PPO | Admitting: Certified Registered"

## 2020-03-27 ENCOUNTER — Encounter (HOSPITAL_BASED_OUTPATIENT_CLINIC_OR_DEPARTMENT_OTHER): Payer: Self-pay | Admitting: Orthopaedic Surgery

## 2020-03-27 ENCOUNTER — Encounter (HOSPITAL_BASED_OUTPATIENT_CLINIC_OR_DEPARTMENT_OTHER)
Admission: RE | Disposition: A | Discharge status: Home / Self Care | Payer: Self-pay | Source: Ambulatory Visit | Attending: Orthopaedic Surgery

## 2020-03-27 DIAGNOSIS — Z79899 Other long term (current) drug therapy: Secondary | ICD-10-CM | POA: Insufficient documentation

## 2020-03-27 DIAGNOSIS — I2510 Atherosclerotic heart disease of native coronary artery without angina pectoris: Secondary | ICD-10-CM | POA: Insufficient documentation

## 2020-03-27 DIAGNOSIS — M7542 Impingement syndrome of left shoulder: Secondary | ICD-10-CM

## 2020-03-27 DIAGNOSIS — M94212 Chondromalacia, left shoulder: Secondary | ICD-10-CM | POA: Diagnosis not present

## 2020-03-27 DIAGNOSIS — Z91041 Radiographic dye allergy status: Secondary | ICD-10-CM | POA: Insufficient documentation

## 2020-03-27 DIAGNOSIS — Z8249 Family history of ischemic heart disease and other diseases of the circulatory system: Secondary | ICD-10-CM | POA: Insufficient documentation

## 2020-03-27 DIAGNOSIS — S43432A Superior glenoid labrum lesion of left shoulder, initial encounter: Secondary | ICD-10-CM | POA: Diagnosis not present

## 2020-03-27 DIAGNOSIS — G8918 Other acute postprocedural pain: Secondary | ICD-10-CM | POA: Diagnosis not present

## 2020-03-27 DIAGNOSIS — M24112 Other articular cartilage disorders, left shoulder: Secondary | ICD-10-CM | POA: Diagnosis not present

## 2020-03-27 DIAGNOSIS — M7502 Adhesive capsulitis of left shoulder: Secondary | ICD-10-CM

## 2020-03-27 DIAGNOSIS — M24012 Loose body in left shoulder: Secondary | ICD-10-CM | POA: Diagnosis not present

## 2020-03-27 DIAGNOSIS — Z955 Presence of coronary angioplasty implant and graft: Secondary | ICD-10-CM | POA: Insufficient documentation

## 2020-03-27 DIAGNOSIS — Z20822 Contact with and (suspected) exposure to covid-19: Secondary | ICD-10-CM | POA: Insufficient documentation

## 2020-03-27 DIAGNOSIS — Z87891 Personal history of nicotine dependence: Secondary | ICD-10-CM | POA: Insufficient documentation

## 2020-03-27 DIAGNOSIS — S43492A Other sprain of left shoulder joint, initial encounter: Secondary | ICD-10-CM | POA: Insufficient documentation

## 2020-03-27 DIAGNOSIS — I480 Paroxysmal atrial fibrillation: Secondary | ICD-10-CM | POA: Insufficient documentation

## 2020-03-27 DIAGNOSIS — Z951 Presence of aortocoronary bypass graft: Secondary | ICD-10-CM | POA: Insufficient documentation

## 2020-03-27 DIAGNOSIS — I252 Old myocardial infarction: Secondary | ICD-10-CM | POA: Insufficient documentation

## 2020-03-27 DIAGNOSIS — X58XXXA Exposure to other specified factors, initial encounter: Secondary | ICD-10-CM | POA: Insufficient documentation

## 2020-03-27 HISTORY — PX: SHOULDER ARTHROSCOPY WITH SUBACROMIAL DECOMPRESSION: SHX5684

## 2020-03-27 LAB — SARS CORONAVIRUS 2 (TAT 6-24 HRS): SARS Coronavirus 2: NEGATIVE

## 2020-03-27 LAB — SARS CORONAVIRUS 2 BY RT PCR (HOSPITAL ORDER, PERFORMED IN ~~LOC~~ HOSPITAL LAB): SARS Coronavirus 2: NEGATIVE

## 2020-03-27 SURGERY — SHOULDER ARTHROSCOPY WITH SUBACROMIAL DECOMPRESSION
Anesthesia: General | Site: Shoulder | Laterality: Left

## 2020-03-27 MED ORDER — AMISULPRIDE (ANTIEMETIC) 5 MG/2ML IV SOLN: 10.0000 mg | Freq: Once | INTRAVENOUS | Status: DC | PRN

## 2020-03-27 MED ORDER — LACTATED RINGERS IV SOLN: INTRAVENOUS | Status: DC

## 2020-03-27 MED ORDER — CEFAZOLIN SODIUM-DEXTROSE 2-4 GM/100ML-% IV SOLN
INTRAVENOUS | Status: AC
  Filled 2020-03-27: qty 100

## 2020-03-27 MED ORDER — OXYCODONE-ACETAMINOPHEN 5-325 MG PO TABS: 1.0000 | ORAL_TABLET | Freq: Three times a day (TID) | ORAL | 0 refills | Status: DC | PRN

## 2020-03-27 MED ORDER — PROPOFOL 10 MG/ML IV BOLUS
INTRAVENOUS | Status: DC | PRN
  Administered 2020-03-27: 200 mg via INTRAVENOUS

## 2020-03-27 MED ORDER — METHOCARBAMOL 750 MG PO TABS: 750.0000 mg | ORAL_TABLET | Freq: Two times a day (BID) | ORAL | 3 refills | Status: AC | PRN

## 2020-03-27 MED ORDER — PHENYLEPHRINE HCL (PRESSORS) 10 MG/ML IV SOLN
INTRAVENOUS | Status: AC
  Filled 2020-03-27: qty 1

## 2020-03-27 MED ORDER — MIDAZOLAM HCL 2 MG/2ML IJ SOLN
INTRAMUSCULAR | Status: AC
  Filled 2020-03-27: qty 2

## 2020-03-27 MED ORDER — ONDANSETRON HCL 4 MG/2ML IJ SOLN
INTRAMUSCULAR | Status: AC
  Filled 2020-03-27: qty 2

## 2020-03-27 MED ORDER — HYDROMORPHONE HCL 1 MG/ML IJ SOLN: 0.2500 mg | INTRAMUSCULAR | Status: DC | PRN

## 2020-03-27 MED ORDER — FENTANYL CITRATE (PF) 100 MCG/2ML IJ SOLN
100.0000 ug | Freq: Once | INTRAMUSCULAR | Status: AC
  Administered 2020-03-27: 12:00:00 100 ug via INTRAVENOUS

## 2020-03-27 MED ORDER — MEPERIDINE HCL 25 MG/ML IJ SOLN: 6.2500 mg | INTRAMUSCULAR | Status: DC | PRN

## 2020-03-27 MED ORDER — CEFAZOLIN SODIUM-DEXTROSE 2-4 GM/100ML-% IV SOLN
2.0000 g | INTRAVENOUS | Status: AC
  Administered 2020-03-27: 12:00:00 2 g via INTRAVENOUS

## 2020-03-27 MED ORDER — PROMETHAZINE HCL 25 MG/ML IJ SOLN: 6.2500 mg | INTRAMUSCULAR | Status: DC | PRN

## 2020-03-27 MED ORDER — BUPIVACAINE HCL (PF) 0.5 % IJ SOLN
INTRAMUSCULAR | Status: DC | PRN
  Administered 2020-03-27: 20 mL via PERINEURAL

## 2020-03-27 MED ORDER — LIDOCAINE HCL (CARDIAC) PF 100 MG/5ML IV SOSY
PREFILLED_SYRINGE | INTRAVENOUS | Status: DC | PRN
  Administered 2020-03-27: 60 mg via INTRATRACHEAL

## 2020-03-27 MED ORDER — FENTANYL CITRATE (PF) 100 MCG/2ML IJ SOLN
INTRAMUSCULAR | Status: AC
  Filled 2020-03-27: qty 2

## 2020-03-27 MED ORDER — BUPIVACAINE LIPOSOME 1.3 % IJ SUSP
INTRAMUSCULAR | Status: DC | PRN
  Administered 2020-03-27: 10 mL via PERINEURAL

## 2020-03-27 MED ORDER — OXYCODONE HCL 5 MG PO TABS: 5.0000 mg | ORAL_TABLET | Freq: Once | ORAL | Status: DC | PRN

## 2020-03-27 MED ORDER — EPHEDRINE SULFATE 50 MG/ML IJ SOLN
INTRAMUSCULAR | Status: DC | PRN
  Administered 2020-03-27 (×3): 10 mg via INTRAVENOUS

## 2020-03-27 MED ORDER — PROPOFOL 10 MG/ML IV BOLUS
INTRAVENOUS | Status: AC
  Filled 2020-03-27: qty 20

## 2020-03-27 MED ORDER — LIDOCAINE 2% (20 MG/ML) 5 ML SYRINGE
INTRAMUSCULAR | Status: AC
  Filled 2020-03-27: qty 5

## 2020-03-27 MED ORDER — ONDANSETRON HCL 4 MG/2ML IJ SOLN
INTRAMUSCULAR | Status: DC | PRN
  Administered 2020-03-27: 4 mg via INTRAVENOUS

## 2020-03-27 MED ORDER — BUPIVACAINE-EPINEPHRINE (PF) 0.25% -1:200000 IJ SOLN
INTRAMUSCULAR | Status: DC | PRN
  Administered 2020-03-27: 20 mL via PERINEURAL

## 2020-03-27 MED ORDER — MIDAZOLAM HCL 2 MG/2ML IJ SOLN
2.0000 mg | Freq: Once | INTRAMUSCULAR | Status: AC
  Administered 2020-03-27: 12:00:00 2 mg via INTRAVENOUS

## 2020-03-27 MED ORDER — OXYCODONE HCL 5 MG/5ML PO SOLN: 5.0000 mg | Freq: Once | ORAL | Status: DC | PRN

## 2020-03-27 SURGICAL SUPPLY — 67 items
ADH SKN CLS APL DERMABOND .7 (GAUZE/BANDAGES/DRESSINGS)
APL SKNCLS STERI-STRIP NONHPOA (GAUZE/BANDAGES/DRESSINGS)
BENZOIN TINCTURE PRP APPL 2/3 (GAUZE/BANDAGES/DRESSINGS) IMPLANT
BLADE EXCALIBUR 4.0X13 (MISCELLANEOUS) ×1 IMPLANT
BNDG COHESIVE 4X5 TAN STRL (GAUZE/BANDAGES/DRESSINGS) ×2 IMPLANT
BURR OVAL 8 FLU 4.0X13 (MISCELLANEOUS) ×2 IMPLANT
CANNULA 5.75X71 LONG (CANNULA) ×2 IMPLANT
CANNULA SHOULDER 7CM (CANNULA) ×2 IMPLANT
CANNULA TWIST IN 8.25X7CM (CANNULA) IMPLANT
COOLER ICEMAN CLASSIC (MISCELLANEOUS) ×2 IMPLANT
DECANTER SPIKE VIAL GLASS SM (MISCELLANEOUS) IMPLANT
DERMABOND ADVANCED (GAUZE/BANDAGES/DRESSINGS)
DERMABOND ADVANCED .7 DNX12 (GAUZE/BANDAGES/DRESSINGS) IMPLANT
DISSECTOR  3.8MM X 13CM (MISCELLANEOUS)
DISSECTOR 3.8MM X 13CM (MISCELLANEOUS) IMPLANT
DRAPE IMP U-DRAPE 54X76 (DRAPES) ×2 IMPLANT
DRAPE INCISE IOBAN 66X45 STRL (DRAPES) ×2 IMPLANT
DRAPE STERI 35X30 U-POUCH (DRAPES) ×2 IMPLANT
DRAPE SURG 17X23 STRL (DRAPES) ×2 IMPLANT
DRAPE U-SHAPE 47X51 STRL (DRAPES) ×2 IMPLANT
DRAPE U-SHAPE 76X120 STRL (DRAPES) ×4 IMPLANT
DRSG PAD ABDOMINAL 8X10 ST (GAUZE/BANDAGES/DRESSINGS) ×2 IMPLANT
DURAPREP 26ML APPLICATOR (WOUND CARE) ×2 IMPLANT
ELECT REM PT RETURN 9FT ADLT (ELECTROSURGICAL) ×2
ELECTRODE REM PT RTRN 9FT ADLT (ELECTROSURGICAL) ×1 IMPLANT
FIBER TAPE 2MM (SUTURE) IMPLANT
GAUZE SPONGE 4X4 12PLY STRL (GAUZE/BANDAGES/DRESSINGS) ×2 IMPLANT
GAUZE XEROFORM 1X8 LF (GAUZE/BANDAGES/DRESSINGS) ×2 IMPLANT
GLOVE BIOGEL PI IND STRL 7.0 (GLOVE) ×1 IMPLANT
GLOVE BIOGEL PI INDICATOR 7.0 (GLOVE) ×2
GLOVE ECLIPSE 6.5 STRL STRAW (GLOVE) ×1 IMPLANT
GLOVE SURG LTX SZ7 (GLOVE) ×3 IMPLANT
GLOVE SURG NEOP MICRO LF SZ7.5 (GLOVE) ×2 IMPLANT
GLOVE SURG SYN 7.5  E (GLOVE) ×4
GLOVE SURG SYN 7.5 E (GLOVE) ×2 IMPLANT
GLOVE SURG SYN 7.5 PF PI (GLOVE) ×2 IMPLANT
GOWN STRL REIN XL XLG (GOWN DISPOSABLE) ×3 IMPLANT
GOWN STRL REUS W/ TWL LRG LVL3 (GOWN DISPOSABLE) ×1 IMPLANT
GOWN STRL REUS W/TWL LRG LVL3 (GOWN DISPOSABLE) ×2
MANIFOLD NEPTUNE II (INSTRUMENTS) ×2 IMPLANT
NDL SCORPION MULTI FIRE (NEEDLE) IMPLANT
NEEDLE SCORPION MULTI FIRE (NEEDLE) IMPLANT
PACK ARTHROSCOPY DSU (CUSTOM PROCEDURE TRAY) ×2 IMPLANT
PACK BASIN DAY SURGERY FS (CUSTOM PROCEDURE TRAY) ×2 IMPLANT
PAD COLD SHLDR WRAP-ON (PAD) ×2 IMPLANT
PORT APPOLLO RF 90DEGREE MULTI (SURGICAL WAND) ×2 IMPLANT
SHEET MEDIUM DRAPE 40X70 STRL (DRAPES) ×4 IMPLANT
SLEEVE SCD COMPRESS KNEE MED (MISCELLANEOUS) ×2 IMPLANT
SLING ARM FOAM STRAP LRG (SOFTGOODS) IMPLANT
STRIP CLOSURE SKIN 1/2X4 (GAUZE/BANDAGES/DRESSINGS) IMPLANT
SUT ETHILON 3 0 PS 1 (SUTURE) ×2 IMPLANT
SUT FIBERWIRE #2 38 T-5 BLUE (SUTURE)
SUT MNCRL AB 4-0 PS2 18 (SUTURE) ×2 IMPLANT
SUT PDS AB 1 CT  36 (SUTURE)
SUT PDS AB 1 CT 36 (SUTURE) IMPLANT
SUT TIGER TAPE 7 IN WHITE (SUTURE) IMPLANT
SUT VIC AB 2-0 CT1 27 (SUTURE) ×2
SUT VIC AB 2-0 CT1 TAPERPNT 27 (SUTURE) ×1 IMPLANT
SUTURE FIBERWR #2 38 T-5 BLUE (SUTURE) IMPLANT
SUTURE TAPE 1.3 40 TPR END (SUTURE) IMPLANT
SUTURE TAPE TIGERLINK 1.3MM BL (SUTURE) IMPLANT
SUTURETAPE 1.3 40 TPR END (SUTURE)
SUTURETAPE TIGERLINK 1.3MM BL (SUTURE)
SYR 50ML LL SCALE MARK (SYRINGE) IMPLANT
TOWEL GREEN STERILE FF (TOWEL DISPOSABLE) ×2 IMPLANT
TUBE CONNECTING 20X1/4 (TUBING) ×1 IMPLANT
TUBING ARTHROSCOPY IRRIG 16FT (MISCELLANEOUS) ×1 IMPLANT

## 2020-03-27 NOTE — Discharge Instructions (Signed)
Post Anesthesia Home Care Instructions  Activity: Get plenty of rest for the remainder of the day. A responsible individual must stay with you for 24 hours following the procedure.  For the next 24 hours, DO NOT: -Drive a car -Advertising copywriter -Drink alcoholic beverages -Take any medication unless instructed by your physician -Make any legal decisions or sign important papers.  Meals: Start with liquid foods such as gelatin or soup. Progress to regular foods as tolerated. Avoid greasy, spicy, heavy foods. If nausea and/or vomiting occur, drink only clear liquids until the nausea and/or vomiting subsides. Call your physician if vomiting continues.  Special Instructions/Symptoms: Your throat may feel dry or sore from the anesthesia or the breathing tube placed in your throat during surgery. If this causes discomfort, gargle with warm salt water. The discomfort should disappear within 24 hours.  If you had a scopolamine patch placed behind your ear for the management of post- operative nausea and/or vomiting:  1. The medication in the patch is effective for 72 hours, after which it should be removed.  Wrap patch in a tissue and discard in the trash. Wash hands thoroughly with soap and water. 2. You may remove the patch earlier than 72 hours if you experience unpleasant side effects which may include dry mouth, dizziness or visual disturbances. 3. Avoid touching the patch. Wash your hands with soap and water after contact with the patch.      Regional Anesthesia Blocks  1. Numbness or the inability to move the "blocked" extremity may last from 3-48 hours after placement. The length of time depends on the medication injected and your individual response to the medication. If the numbness is not going away after 48 hours, call your surgeon.  2. The extremity that is blocked will need to be protected until the numbness is gone and the  Strength has returned. Because you cannot feel it, you  will need to take extra care to avoid injury. Because it may be weak, you may have difficulty moving it or using it. You may not know what position it is in without looking at it while the block is in effect.  3. For blocks in the legs and feet, returning to weight bearing and walking needs to be done carefully. You will need to wait until the numbness is entirely gone and the strength has returned. You should be able to move your leg and foot normally before you try and bear weight or walk. You will need someone to be with you when you first try to ensure you do not fall and possibly risk injury.  4. Bruising and tenderness at the needle site are common side effects and will resolve in a few days.  5. Persistent numbness or new problems with movement should be communicated to the surgeon or the [facility] [id number] [facility] [phone]).      Post-operative patient instructions  Shoulder Arthroscopy   . Ice:  Place intermittent ice or cooler pack over your shoulder, 30 minutes on and 30 minutes off.  Continue this for the first 72 hours after surgery, then save ice for use after therapy sessions or on more active days.   . Weight:  You may bear weight on your arm as your symptoms allow. . Motion:  Perform gentle shoulder motion as tolerated.  Begin shoulder exercises as soon as your symptoms allow. . Dressing:  Perform 1st dressing change at 2 days postoperative. A moderate amount of blood tinged  drainage is to be expected.  So if you bleed through the dressing on the first or second day or if you have fevers, it is fine to change the dressing/check the wounds early and redress wound.  If it bleeds through again, or if the incisions are leaking frank blood, please call the office. May change dressing every 1-2 days thereafter to help watch wounds. Can purchase Tegaderm (or 20M Nexcare) water resistant dressings at local pharmacy / [facility]. . Shower:  Light  shower is ok after 2 days.  Please take shower, NO bath. Recover with gauze and ace wrap to help keep wounds protected.   . Pain medication:  A narcotic pain medication has been prescribed.  Take as directed.  Typically you need narcotic pain medication more regularly during the first 3 to 5 days after surgery.  Decrease your use of the medication as the pain improves.  Narcotics can sometimes cause constipation, even after a few doses.  If you have problems with constipation, you can take an over the counter stool softener or light laxative.  If you have persistent problems, please notify your physician's office. Marland Kitchen Physical therapy: Additional activity guidelines to be provided by your physician or physical therapist at follow-up visits.  . Driving: Do not recommend driving x 2 weeks post surgical, especially if surgery performed on right side. Should not drive while taking narcotic pain medications. It typically takes at least 2 weeks to restore sufficient neuromuscular function for normal reaction times for driving safety.  . Call [phone] for questions or problems. Evenings you will be forwarded to the hospital operator.  Ask for the orthopaedic physician on call. Please call if you experience:    o Redness, foul smelling, or persistent drainage from the surgical site  o worsening shoulder pain and swelling not responsive to medication  o any calf pain and or swelling of the lower leg  o temperatures greater than 101.5 F o other questions or concerns   Thank you for allowing [place] to be a part of your care.

## 2020-03-27 NOTE — Anesthesia Preprocedure Evaluation (Addendum)
Anesthesia Evaluation  Patient identified by MRN, date of birth, ID band Patient awake    Reviewed: Allergy & Precautions, NPO status , Patient's Chart, lab work & pertinent test results, reviewed documented beta blocker date and time   History of Anesthesia Complications Negative for: history of anesthetic complications  Airway Mallampati: III  TM Distance: >3 FB Neck ROM: Full    Dental  (+) Dental Advisory Given, Teeth Intact   Pulmonary sleep apnea and Continuous Positive Airway Pressure Ventilation , former smoker,    breath sounds clear to auscultation       Cardiovascular + CAD, + Past MI, + Cardiac Stents (2017) and + CABG (2009)  + dysrhythmias Atrial Fibrillation  Rhythm:Regular Rate:Bradycardia   '17 Cath - 1. Severe three-vessel coronary artery disease with severe proximal LAD stenosis, total occlusion of the third OM branch of the circumflex, and severe native RCA stenosis 2. Status post CABG with continued patency of the LIMA to OM 3, RIMA to LAD, and SVG to distal RCA 3. Severe diffuse vein graft disease of the SVG to distal RCA 4. Successful native vessel PCI of the right coronary artery using a single drug-eluting stent 5. Mild segmental contraction abnormality the left ventricle with preserved overall LVEF  Inc RBBB    Neuro/Psych PSYCHIATRIC DISORDERS Anxiety negative neurological ROS     GI/Hepatic Neg liver ROS, GERD  Medicated and Controlled,  Endo/Other  negative endocrine ROS Obesity   Renal/GU negative Renal ROS     Musculoskeletal  (+) Arthritis ,   Abdominal (+) + obese,   Peds  Hematology negative hematology ROS (+)   Anesthesia Other Findings   Reproductive/Obstetrics                             Anesthesia Physical  Anesthesia Plan  ASA: III  Anesthesia Plan: General   Post-op Pain Management:  Regional for Post-op pain   Induction:  Intravenous  PONV Risk Score and Plan: 2 and Treatment may vary due to age or medical condition, Ondansetron and Midazolam  Airway Management Planned: LMA  Additional Equipment: None  Intra-op Plan:   Post-operative Plan: Extubation in OR  Informed Consent: I have reviewed the patients History and Physical, chart, labs and discussed the procedure including the risks, benefits and alternatives for the proposed anesthesia with the patient or authorized representative who has indicated his/her understanding and acceptance.     Dental advisory given  Plan Discussed with: CRNA and Anesthesiologist  Anesthesia Plan Comments:         Anesthesia Quick Evaluation

## 2020-03-27 NOTE — Progress Notes (Signed)
Assisted Dr. Miller with left, ultrasound guided, interscalene  block. Side rails up, monitors on throughout procedure. See vital signs in flow sheet. Tolerated Procedure well.  

## 2020-03-27 NOTE — Anesthesia Procedure Notes (Signed)
Anesthesia Regional Block: Interscalene brachial plexus block   Pre-Anesthetic Checklist: ,, timeout performed, Correct Patient, Correct Site, Correct Laterality, Correct Procedure, Correct Position, site marked, Risks and benefits discussed,  Surgical consent,  Pre-op evaluation,  At surgeon's request and post-op pain management  Laterality: Left  Prep: chloraprep       Needles:  Injection technique: Single-shot  Needle Type: Stimiplex     Needle Length: 9cm  Needle Gauge: 21     Additional Needles:   Procedures:,,,, ultrasound used (permanent image in chart),,,,  Narrative:  Start time: 03/27/2020 11:33 AM End time: 03/27/2020 11:38 AM Injection made incrementally with aspirations every 5 mL.  Performed by: Personally  Anesthesiologist: Lowella Curb, MD

## 2020-03-27 NOTE — H&P (Signed)
PREOPERATIVE H&P  Chief Complaint: left shoulder labral tear  HPI: Jacob Gibbs is a 59 y.o. male who presents for surgical treatment of left shoulder labral tear.  He denies any changes in medical history.  Past Medical History:  Diagnosis Date  . Anxiety   . Coronary artery disease    a. 2009: mutivessel CAD s/p CABG   b. 01/2016: inf STEMI s/p DES to RCA  . DDD (degenerative disc disease), cervical   . History of tobacco use    REMOTE  . Hyperlipidemia   . PAF (paroxysmal atrial fibrillation) (HCC)    a. brief episode in dec 2005 wtih no recurrence. No indication for OAC  . Sleep apnea    uses cpap  . Syncope and collapse    Past Surgical History:  Procedure Laterality Date  . CARDIAC CATHETERIZATION N/A 02/24/2016   Procedure: Coronary Stent Intervention;  Surgeon: Tonny Bollman, MD;  Location: [facility] INVASIVE CV LAB;  Service: Cardiovascular;  Laterality: N/A;  . CARDIAC CATHETERIZATION N/A 02/24/2016   Procedure: Left Heart Cath and Cors/Grafts Angiography;  Surgeon: Tonny Bollman, MD;  Location: [facility] INVASIVE CV LAB;  Service: Cardiovascular;  Laterality: N/A;  . CORONARY ARTERY BYPASS GRAFT  02/06/08   X 3  . SHOULDER ARTHROSCOPY WITH ROTATOR CUFF REPAIR AND SUBACROMIAL DECOMPRESSION Right 09/21/2018   Procedure: RIGHT SHOULDER ARTHROSCOPY WITH EXTENSIVE DEBRIDEMENT, BICEPS TENODESIS, SUBACROMIAL DECOMPRESSION,  ROTATOR CUFF REPAIR;  Surgeon: Tarry Kos, MD;  Location: [facility];  Service: Orthopedics;  Laterality: Right;   Social History   Socioeconomic History  . Marital status: Married    Spouse name: Not on file  . Number of children: Not on file  . Years of education: Not on file  . Highest education level: Not on file  Occupational History  . Occupation: Firefighter  Tobacco Use  . Smoking status: Former Smoker    Packs/day: 0.50    Years: 20.00    Pack years: 10.00    Types: Cigarettes    Quit date: 01/07/2004    Years  since quitting: 16.2  . Smokeless tobacco: Never Used  . Tobacco comment: Stopped 89yrs ago  Substance and Sexual Activity  . Alcohol use: Yes    Alcohol/week: 3.0 standard drinks    Types: 3 Standard drinks or equivalent per week    Comment: OCCASIONAL  . Drug use: No  . Sexual activity: Not on file  Other Topics Concern  . Not on file  Social History Narrative   MARRIED   TOBACCO USE ON AND OFF   ETOH OCCASIONALLY   USES HERBAL SUPP. ALONG WITH A DAILY VITAMIN PACK   HAD STRESS TEST APPROX 5 YRS   NEW ONSET AFIB   SYNCOPE   Social Determinants of Health   Financial Resource Strain: Not on file  Food Insecurity: Not on file  Transportation Needs: Not on file  Physical Activity: Not on file  Stress: Not on file  Social Connections: Not on file   Family History  Problem Relation Age of Onset  . Coronary artery disease Mother        ALSO ONE OF HIS UNCLES   Allergies  Allergen Reactions  . Contrast Media [Iodinated Diagnostic Agents] Other (See Comments)    Face turned scaly (1989 CT).  Had MR arthrogram 03/07/18 (w/ CT contrast) with no prep and no problems.   Prior to Admission medications   Medication Sig Start Date End Date Taking? Authorizing Provider  ALPRAZolam (XANAX) 0.5 MG tablet Take 0.25 mg by mouth at bedtime.  02/22/18  Yes [provider]  clopidogrel (PLAVIX) 75 MG tablet Take 1 tablet (75 mg total) by mouth daily. 05/26/19  Yes Kathleene Hazel, MD  ezetimibe (ZETIA) 10 MG tablet Take 1 tablet (10 mg total) by mouth daily. 05/26/19  Yes Kathleene Hazel, MD  metoprolol tartrate (LOPRESSOR) 25 MG tablet TAKE ONE-HALF (1/2) TABLET TWICE A DAY 08/21/19  Yes Kathleene Hazel, MD  rosuvastatin (CRESTOR) 20 MG tablet Take 0.5 tablets (10 mg total) by mouth daily. 05/24/19  Yes Kathleene Hazel, MD  testosterone cypionate (DEPOTESTOSTERONE CYPIONATE) 200 MG/ML injection Inject 100 mg into the muscle every Tuesday.   Yes [provider]  nitroGLYCERIN (NITROSTAT) 0.4 MG SL tablet Place 1 tablet (0.4 mg total) under the tongue every 5 (five) minutes x 3 doses as needed for chest pain. 03/13/20   Rosalio Macadamia, NP  omega-3 acid ethyl esters (LOVAZA) 1 g capsule TAKE 1 CAPSULE BY MOUTH TWICE DAILY 03/25/20   Kathleene Hazel, MD     Positive ROS: All other systems have been reviewed and were otherwise negative with the exception of those mentioned in the HPI and as above.  Physical Exam: General: Alert, no acute distress Cardiovascular: No pedal edema Respiratory: No cyanosis, no use of accessory musculature GI: abdomen soft Skin: No lesions in the area of chief complaint Neurologic: Sensation intact distally Psychiatric: Patient is competent for consent with normal mood and affect Lymphatic: no lymphedema  MUSCULOSKELETAL: exam stable  Assessment: left shoulder labral tear  Plan: Plan for Procedure(s): LEFT SHOULDER ARTHROSCOPY WITH EXTENSIVE DEBRIDEMENT, SUBACROMIAL DECOMPRESSION AND BICEPS TENODESIS  The risks benefits and alternatives were discussed with the patient including but not limited to the risks of nonoperative treatment, versus surgical intervention including infection, bleeding, nerve injury,  blood clots, cardiopulmonary complications, morbidity, mortality, among others, and they were willing to proceed.   Preoperative templating of the joint replacement has been completed, documented, and submitted to the Operating Room personnel in order to optimize intra-operative equipment management.   Glee Arvin, MD 03/27/2020 10:14 AM

## 2020-03-27 NOTE — Transfer of Care (Signed)
Immediate Anesthesia Transfer of Care Note  Patient: Jacob Gibbs  Procedure(s) Performed: LEFT SHOULDER ARTHROSCOPY WITH EXTENSIVE DEBRIDEMENT LABRUM AND BICEPS TENDON, SUBACROMIAL DECOMPRESSION, LYSIS OF ADHESIONS, MANIPULATION UNDER ANESTHESIA (Left Shoulder)  Patient Location: PACU  Anesthesia Type:General and Regional  Level of Consciousness: drowsy and patient cooperative  Airway & Oxygen Therapy: Patient Spontanous Breathing and Patient connected to face mask oxygen  Post-op Assessment: Report given to RN and Post -op Vital signs reviewed and stable  Post vital signs: Reviewed and stable  Last Vitals:  Vitals Value Taken Time  BP    Temp    Pulse 69 03/27/20 1322  Resp 17 03/27/20 1322  SpO2 99 % 03/27/20 1322  Vitals shown include unvalidated device data.  Last Pain:  Vitals:   03/27/20 1053  TempSrc: Oral  PainSc: 0-No pain         Complications: No complications documented.

## 2020-03-27 NOTE — Anesthesia Postprocedure Evaluation (Signed)
Anesthesia Post Note  Patient: Jacob Gibbs  Procedure(s) Performed: LEFT SHOULDER ARTHROSCOPY WITH EXTENSIVE DEBRIDEMENT LABRUM AND BICEPS TENDON, SUBACROMIAL DECOMPRESSION, LYSIS OF ADHESIONS, MANIPULATION UNDER ANESTHESIA (Left Shoulder)     Patient location during evaluation: PACU Anesthesia Type: General Level of consciousness: awake and alert Pain management: pain level controlled Vital Signs Assessment: post-procedure vital signs reviewed and stable Respiratory status: spontaneous breathing, nonlabored ventilation and respiratory function stable Cardiovascular status: blood pressure returned to baseline and stable Postop Assessment: no apparent nausea or vomiting Anesthetic complications: no   No complications documented.  Last Vitals:  Vitals:   03/27/20 1346 03/27/20 1415  BP:  116/79  Pulse: 73 69  Resp: 14 16  Temp:  (!) 36.1 C  SpO2: 95% 95%    Last Pain:  Vitals:   03/27/20 1415  TempSrc:   PainSc: 0-No pain                 Lowella Curb

## 2020-03-27 NOTE — Op Note (Signed)
Date of Surgery: 03/27/2020  INDICATIONS: The patient is a 59 year old gentleman with left shoulder pain that has failed conservative treatment;  The patient did consent to the procedure after discussion of the risks and benefits.  PREOPERATIVE DIAGNOSIS:  1.  Left shoulder adhesive capsulitis 2.  Left glenohumeral chondromalacia 3.  Left shoulder SLAP tear 4.  Left supraspinatus tendinopathy 5.  Left shoulder impingement syndrome  POSTOPERATIVE DIAGNOSIS: Same.  PROCEDURE:  1.  Manipulation of left shoulder joint under general anesthesia 2.  Left shoulder arthroscopic lysis of adhesions 3.  Left shoulder arthroscopic loose body removal 6 mm 4.  Left shoulder arthroscopic extensive debridement of glenohumeral surface, anterior labrum, superior labrum, posterior labrum, articular surface of supraspinatus 5.  Left shoulder arthroscopic subacromial decompression with CA ligament release  SURGEON: N. Glee Arvin, M.D.  ASSIST: Oneal Grout, PA-C  ANESTHESIA:  general, regional  IV FLUIDS AND URINE: See anesthesia.  ESTIMATED BLOOD LOSS: minimal mL.  IMPLANTS: None  COMPLICATIONS: None.  DESCRIPTION OF PROCEDURE: The patient was brought to the operating room and placed supine on the operating table.  The patient had been signed prior to the procedure and this was documented. The patient had the anesthesia placed by the anesthesiologist.  A time-out was performed to confirm that this was the correct patient, site, side and location. The patient did receive antibiotics prior to the incision and was re-dosed during the procedure as needed at indicated intervals.  The patient was then positioned into the beach chair position with all bony prominences well padded and neutral C spine. The patient had the operative extremity prepped and draped in the standard surgical fashion.    I began with an examination of the left shoulder which revealed for flexion to 90 degrees and external  rotation to 35 degrees with solid endpoint.  Manipulation was then performed with audible soft tissue crepitus.  I was able to improve the forward flexion to 165 degrees and external rotation to 65 degrees.  Incisions were then made for standard arthroscopy portals.  Diagnostic shoulder arthroscopy was first performed which revealed inflamed synovitis and inflamed tissue of the labrum and the rotator interval consistent with adhesive capsulitis.  The humeral head surface demonstrated grade IV chondromalacia centrally with areas of unstable cartilage.  Did identify a 6 mm loose body in the joint which was removed without difficulty.  Chondroplasty was performed of the humeral surface.  The glenoid demonstrated grade II chondromalacia in the central portion.  The labrum was inflamed and had mild degenerative tears in the anterior superior and posterior regions.  Gentle debridement was performed.  The superior labral biceps anchor was intact.  He had a very mild SLAP tear which was not unstable when probed.  I decided not to perform a biceps tenodesis or tenotomy.  The articular surface of the supraspinatus demonstrated mild tendinopathy especially anteriorly.  Gentle debridement was performed.  There was no evidence of a full-thickness tear.  The subscapularis tendon was unremarkable.  Rotator interval was released using an ArthroCare wand.  Arthroscope was then repositioned into the subacromial space.  Additional lateral portal was created.  Subacromial decompression including subacromial and subdeltoid bursectomy performed.  CA ligament was released.  AC joint was unremarkable.  Bursal surface of the supraspinatus was unremarkable.   Instruments were then removed and I manipulated the shoulder once again with for flexion and external rotation.  Incisions were closed with interrupted nylon sutures.  Sterile dressings were applied.  Patient tolerated procedure  well had no immediate complications. Tessa Lerner, my  PA, was a medical necessity for the entirety of the surgery including opening, closing, limb positioning, retracting, exposing, and manipulating.  POSTOPERATIVE PLAN: Patient will follow up next week for suture removal.  We will place an order for outpatient PT to begin as soon as possible.  Mayra Reel, MD [facility] [phone] 1:27 PM

## 2020-03-27 NOTE — Anesthesia Procedure Notes (Signed)
Procedure Name: LMA Insertion Date/Time: 03/27/2020 12:16 PM Performed by: Thornell Mule, CRNA Pre-anesthesia Checklist: Patient identified, Emergency Drugs available, Suction available and Patient being monitored Patient Re-evaluated:Patient Re-evaluated prior to induction Oxygen Delivery Method: Circle system utilized Preoxygenation: Pre-oxygenation with 100% oxygen Induction Type: IV induction LMA: LMA inserted LMA Size: 5.0 Number of attempts: 1 Placement Confirmation: positive ETCO2 Tube secured with: Tape Dental Injury: Teeth and Oropharynx as per pre-operative assessment

## 2020-04-01 ENCOUNTER — Encounter (HOSPITAL_BASED_OUTPATIENT_CLINIC_OR_DEPARTMENT_OTHER): Payer: Self-pay | Admitting: Orthopaedic Surgery

## 2020-04-01 ENCOUNTER — Ambulatory Visit (INDEPENDENT_AMBULATORY_CARE_PROVIDER_SITE_OTHER): Payer: 59 | Admitting: Rehabilitative and Restorative Service Providers"

## 2020-04-01 ENCOUNTER — Other Ambulatory Visit: Payer: Self-pay

## 2020-04-01 DIAGNOSIS — M25612 Stiffness of left shoulder, not elsewhere classified: Secondary | ICD-10-CM | POA: Diagnosis not present

## 2020-04-01 DIAGNOSIS — G8929 Other chronic pain: Secondary | ICD-10-CM | POA: Diagnosis not present

## 2020-04-01 DIAGNOSIS — M25512 Pain in left shoulder: Secondary | ICD-10-CM | POA: Diagnosis not present

## 2020-04-01 DIAGNOSIS — R293 Abnormal posture: Secondary | ICD-10-CM

## 2020-04-01 DIAGNOSIS — M6281 Muscle weakness (generalized): Secondary | ICD-10-CM | POA: Diagnosis not present

## 2020-04-01 NOTE — Patient Instructions (Signed)
Access Code: [email or web address] URL: [email or web address] Date: 04/01/2020 Prepared by: Pauletta Browns  Exercises Supine Scapular Protraction in Flexion with Dumbbells - 2-5 x daily - 7 x weekly - 1 sets - 20 reps - 3 seconds hold Supine Shoulder Internal Rotation Stretch - 2-3 x daily - 7 x weekly - 1 sets - 10-20 reps - 10 secondsinutes hold Supine Shoulder External Rotation Stretch - 2-3 x daily - 7 x weekly - 1 sets - 10-20 reps - 10 seconds hold Flexion-Extension Shoulder Pendulum with Table Support - 5 x daily - 7 x weekly - 1 sets - 30 reps Standing Scapular Retraction - 5 x daily - 7 x weekly - 1 sets - 5 reps - 5 second hold

## 2020-04-01 NOTE — Therapy (Signed)
[facility] [place], [place], [address] Phone: [phone]   Fax:  [phone]  Physical Therapy Evaluation  Patient Details  Name: Jacob Gibbs MRN: [id number] Date of Birth: May 09, 1960 Referring Provider (PT): Leandrew Koyanagi MD   Encounter Date: 04/01/2020   PT End of Session - 04/01/20 1602    Visit Number 1    Number of Visits 39    PT Start Time 0804    PT Stop Time 0845    PT Time Calculation (min) 41 min    Activity Tolerance Patient tolerated treatment well;Patient limited by pain    Behavior During Therapy [facility] for tasks assessed/performed           Past Medical History:  Diagnosis Date  . Anxiety   . Coronary artery disease    a. 2009: mutivessel CAD s/p CABG   b. 01/2016: inf STEMI s/p DES to RCA  . DDD (degenerative disc disease), cervical   . History of tobacco use    REMOTE  . Hyperlipidemia   . PAF (paroxysmal atrial fibrillation) (HCC)    a. brief episode in dec 2005 wtih no recurrence. No indication for [place]  . Sleep apnea    uses cpap  . Syncope and collapse     Past Surgical History:  Procedure Laterality Date  . CARDIAC CATHETERIZATION N/A 02/24/2016   Procedure: Coronary Stent Intervention;  Surgeon: Sherren Mocha, MD;  Location: [place] CV LAB;  Service: Cardiovascular;  Laterality: N/A;  . CARDIAC CATHETERIZATION N/A 02/24/2016   Procedure: Left Heart Cath and Cors/Grafts Angiography;  Surgeon: Sherren Mocha, MD;  Location: [place] CV LAB;  Service: Cardiovascular;  Laterality: N/A;  . CORONARY ARTERY BYPASS GRAFT  02/06/08   X 3  . SHOULDER ARTHROSCOPY WITH ROTATOR CUFF REPAIR AND SUBACROMIAL DECOMPRESSION Right 09/21/2018   Procedure: RIGHT SHOULDER ARTHROSCOPY WITH EXTENSIVE DEBRIDEMENT, BICEPS TENODESIS, SUBACROMIAL DECOMPRESSION,  ROTATOR CUFF REPAIR;  Surgeon: Leandrew Koyanagi, MD;  Location: [place];  Service: Orthopedics;  Laterality: Right;  . SHOULDER  ARTHROSCOPY WITH SUBACROMIAL DECOMPRESSION Left 03/27/2020   Procedure: LEFT SHOULDER ARTHROSCOPY WITH EXTENSIVE DEBRIDEMENT LABRUM AND BICEPS TENDON, SUBACROMIAL DECOMPRESSION, LYSIS OF ADHESIONS, MANIPULATION UNDER ANESTHESIA;  Surgeon: Leandrew Koyanagi, MD;  Location: [place];  Service: Orthopedics;  Laterality: Left;    There were no vitals filed for this visit.    Subjective Assessment - 04/01/20 1559    Subjective Devarion had a L shoulder arthroscopy and manipulation 03/27/2020.  He has a history of B shoulder OA with a previous R RTC tear.    Pertinent History CABG X 3.  OA C-spine and B shoulders    Limitations Lifting    Patient Stated Goals Return to normal L shoulder activities without pain (reaching, overhead function)    Currently in Pain? Yes    Pain Score 3     Pain Location Shoulder    Pain Orientation Left    Pain Descriptors / Indicators Aching;Tightness    Pain Type Chronic pain;Surgical pain    Pain Onset More than a month ago    Pain Frequency Intermittent    Aggravating Factors  End ROM    Pain Relieving Factors Ice    Effect of Pain on Daily Activities Very stiff L shoulder limits function    Multiple Pain Sites No              [facility] PT Assessment - 04/01/20 0001  Assessment   Medical Diagnosis s/p L shoulder arthroscopy/adhesive capsulitis    Referring Provider (PT) Coralyn Mark Donnelly Stager MD    Onset Date/Surgical Date 03/27/20    Hand Dominance Right    Next MD Visit 04/03/20      Balance Screen   Has the patient fallen in the past 6 months No    Has the patient had a decrease in activity level because of a fear of falling?  No    Is the patient reluctant to leave their home because of a fear of falling?  No      Prior Function   Level of Independence Independent    Vocation Full time employment      Cognition   Overall Cognitive Status Within Functional Limits for tasks assessed      Observation/Other Assessments   Focus on Therapeutic  Outcomes (FOTO)  34 (66 Goal)      ROM / Strength   AROM / PROM / Strength AROM      AROM   Overall AROM  Deficits    AROM Assessment Site Shoulder    Right/Left Shoulder Left;Right    Right Shoulder Flexion 170 Degrees    Right Shoulder Internal Rotation 40 Degrees    Right Shoulder External Rotation 95 Degrees    Right Shoulder Horizontal  ADduction 35 Degrees    Left Shoulder Flexion 100 Degrees    Left Shoulder Internal Rotation 20 Degrees    Left Shoulder External Rotation 10 Degrees    Left Shoulder Horizontal ADduction 5 Degrees                      Objective measurements completed on examination: See above findings.       [facility] Adult PT Treatment/Exercise - 04/01/20 0001      Exercises   Exercises Shoulder      Shoulder Exercises: Supine   Protraction AAROM;Left;20 reps    Protraction Limitations R side helps PRN Palm in    External Rotation AROM;Left;10 reps    External Rotation Limitations 10 seconds    Internal Rotation AROM;Left;10 reps;Other (comment)    Internal Rotation Limitations 10 seconds      Shoulder Exercises: Standing   Retraction Strengthening;Both;10 reps    Retraction Limitations 5 seconds (shoulder blade pinches)    Other Standing Exercises Codmans (F/B; L/R; CW; CCW) 20X each                  PT Education - 04/01/20 1602    Education Details Discussed anatomy of the shoulder, exam findings, plan of care and reviewed HEP.    Person(s) Educated Patient    Methods Explanation;Demonstration;Tactile cues;Verbal cues;Handout    Comprehension Verbal cues required;Need further instruction;Returned demonstration;Verbalized understanding;Tactile cues required               PT Long Term Goals - 04/01/20 1607      PT LONG TERM GOAL #1   Title Improve FOTO score to 66.    Baseline 34    Time 12    Period Weeks    Status New    Target Date 06/27/20      PT LONG TERM GOAL #2   Title Improve L shoulder AROM to 90% of  the uninvolved R.    Baseline See objective    Time 12    Period Weeks    Status New    Target Date 06/27/20      PT LONG TERM GOAL #  3   Title Improve L shoulder pain to consistently 0-3/10 on the Numeric Pain Rating Scale.    Baseline Can be 7+/10    Time 12    Period Weeks    Status New    Target Date 06/27/20      PT LONG TERM GOAL #4   Title Improve L shoulder strength as assessed by functional scores, subjective self-report and MMT.    Baseline Poor    Time 12    Period Weeks    Status New    Target Date 06/27/20      PT LONG TERM GOAL #5   Title Yuvaan will be independent with his long-term HEP at DC.    Time 12    Period Weeks    Status New    Target Date 06/27/20                  Plan - 04/01/20 1603    Clinical Impression Statement Toshio has a very stiff L shoulder post manipulation and arthroscopy.  He reports he has not done any exercises since manipulation.  AROM is limited in all planes and will be the early focus with Jillyn Hidden.  Capsular flexibility exercises, joint manipulations and passive stretching will be needed to return Kendrick to his previous level of function including playing tennis.    Personal Factors and Comorbidities Comorbidity 1    Comorbidities Severe shoulder OA, previous CABG X 3 and cervical OA.    Examination-Activity Limitations Bathing;Dressing;Sleep;Hygiene/Grooming;Bed Mobility;Lift;Reach Overhead;Carry    Examination-Participation Restrictions Occupation;Community Activity    Stability/Clinical Decision Making Stable/Uncomplicated    Clinical Decision Making Low    Rehab Potential Good    PT Frequency 3x / week    PT Duration 12 weeks    PT Treatment/Interventions ADLs/Self Care Home Management;Moist Heat;Cryotherapy;Therapeutic activities;Therapeutic exercise;Neuromuscular re-education;Patient/family education;Manual techniques;Passive range of motion;Vasopneumatic Device;Joint Manipulations    PT Next Visit Plan Capsular stretching  and ROM (Passive, active, active-assisted)    PT Home Exercise Plan Access Code: [email or web address]    Consulted and Agree with Plan of Care Patient           Patient will benefit from skilled therapeutic intervention in order to improve the following deficits and impairments:  Decreased activity tolerance,Decreased endurance,Decreased range of motion,Decreased mobility,Decreased strength,Hypomobility,Increased edema,Impaired flexibility,Impaired UE functional use,Pain  Visit Diagnosis: Abnormal posture  Stiffness of left shoulder, not elsewhere classified  Chronic left shoulder pain  Muscle weakness (generalized)     Problem List Patient Active Problem List   Diagnosis Date Noted  . Adhesive capsulitis of left shoulder 03/27/2020  . Loose body in shoulder joint, left 03/27/2020  . Impingement syndrome of left shoulder 03/27/2020  . 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 Coronary artery disease   . PAF (paroxysmal atrial fibrillation) (HCC)   . Acute ST elevation myocardial infarction (STEMI) of inferior wall (HCC) 02/24/2016  . HLD (hyperlipidemia) 08/19/2010    Cherlyn Cushing PT, MPT 04/01/2020, 4:11 PM  [facility] Physical Therapy [address] [place], [place], [address] Phone: [phone]   Fax:  [phone]  Name: JERY HOLLERN MRN: [id number] Date of Birth: 12-Mar-1961

## 2020-04-03 ENCOUNTER — Encounter: Payer: Self-pay | Admitting: Physician Assistant

## 2020-04-03 ENCOUNTER — Ambulatory Visit (INDEPENDENT_AMBULATORY_CARE_PROVIDER_SITE_OTHER): Payer: BLUE CROSS/BLUE SHIELD | Admitting: Physician Assistant

## 2020-04-03 DIAGNOSIS — Z9889 Other specified postprocedural states: Secondary | ICD-10-CM

## 2020-04-03 MED ORDER — TRAMADOL HCL 50 MG PO TABS: 50.0000 mg | ORAL_TABLET | Freq: Four times a day (QID) | ORAL | 2 refills | Status: AC | PRN

## 2020-04-03 NOTE — Progress Notes (Signed)
Post-Op Visit Note   Patient: Jacob Gibbs           Date of Birth: [birth year]           MRN: [id number] Visit Date: 04/03/2020 PCP: Martha Clan, MD   Assessment & Plan:  Chief Complaint:  Chief Complaint  Patient presents with  . Left Shoulder - Routine Post Op   Visit Diagnoses:  1. S/P arthroscopy of left shoulder     Plan: Patient is a pleasant 60 year old gentleman who comes in today 1 week out left shoulder manipulation under anesthesia, arthroscopic debridement and subacromial decompression.  He has been doing fairly well since surgery.  He did start physical therapy this past Monday.  After performing yesterday's exercises he has had increased pain was unable to sleep last night.  He has only been taking Tylenol as he did not tolerate the side effects from oxycodone.  Examination of his left shoulder reveals fully healed surgical portals with nylon sutures in place.  No evidence of infection or cellulitis.  He does have moderate and diffuse ecchymosis.  He is neurovascular intact distally.  Today, sutures were removed and Steri-Strips applied.  He will continue with his exercise program.  I have called in tramadol to take as needed.  He will follow up with [place] in 5 weeks time.  Follow-Up Instructions: Return in about 5 weeks (around 05/08/2020).   Orders:  No orders of the defined types were placed in this encounter.  Meds ordered this encounter  Medications  . traMADol (ULTRAM) 50 MG tablet    Sig: Take 1 tablet (50 mg total) by mouth every 6 (six) hours as needed.    Dispense:  30 tablet    Refill:  2    Imaging: No new imaging  PMFS History: Patient Active Problem List   Diagnosis Date Noted  . Adhesive capsulitis of left shoulder 03/27/2020  . Loose body in shoulder joint, left 03/27/2020  . Impingement syndrome of left shoulder 03/27/2020  . 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 Coronary artery disease   . PAF (paroxysmal  atrial fibrillation) (HCC)   . Acute ST elevation myocardial infarction (STEMI) of inferior wall (HCC) 02/24/2016  . HLD (hyperlipidemia) 08/19/2010   Past Medical History:  Diagnosis Date  . Anxiety   . Coronary artery disease    a. 2009: mutivessel CAD s/p CABG   b. 01/2016: inf STEMI s/p DES to RCA  . DDD (degenerative disc disease), cervical   . History of tobacco use    REMOTE  . Hyperlipidemia   . PAF (paroxysmal atrial fibrillation) (HCC)    a. brief episode in dec 2005 wtih no recurrence. No indication for OAC  . Sleep apnea    uses cpap  . Syncope and collapse     Family History  Problem Relation Age of Onset  . Coronary artery disease Mother        ALSO ONE OF HIS UNCLES    Past Surgical History:  Procedure Laterality Date  . CARDIAC CATHETERIZATION N/A 02/24/2016   Procedure: Coronary Stent Intervention;  Surgeon: Tonny Bollman, MD;  Location: [facility] INVASIVE CV LAB;  Service: Cardiovascular;  Laterality: N/A;  . CARDIAC CATHETERIZATION N/A 02/24/2016   Procedure: Left Heart Cath and Cors/Grafts Angiography;  Surgeon: Tonny Bollman, MD;  Location: [facility] INVASIVE CV LAB;  Service: Cardiovascular;  Laterality: N/A;  . CORONARY ARTERY BYPASS GRAFT  02/06/08   X 3  . SHOULDER ARTHROSCOPY  WITH ROTATOR CUFF REPAIR AND SUBACROMIAL DECOMPRESSION Right 09/21/2018   Procedure: RIGHT SHOULDER ARTHROSCOPY WITH EXTENSIVE DEBRIDEMENT, BICEPS TENODESIS, SUBACROMIAL DECOMPRESSION,  ROTATOR CUFF REPAIR;  Surgeon: Tarry Kos, MD;  Location: [facility];  Service: Orthopedics;  Laterality: Right;  . SHOULDER ARTHROSCOPY WITH SUBACROMIAL DECOMPRESSION Left 03/27/2020   Procedure: LEFT SHOULDER ARTHROSCOPY WITH EXTENSIVE DEBRIDEMENT LABRUM AND BICEPS TENDON, SUBACROMIAL DECOMPRESSION, LYSIS OF ADHESIONS, MANIPULATION UNDER ANESTHESIA;  Surgeon: Tarry Kos, MD;  Location: [facility];  Service: Orthopedics;  Laterality: Left;   Social History   Occupational  History  . Occupation: Firefighter  Tobacco Use  . Smoking status: Former Smoker    Packs/day: 0.50    Years: 20.00    Pack years: 10.00    Types: Cigarettes    Quit date: 01/07/2004    Years since quitting: 16.2  . Smokeless tobacco: Never Used  . Tobacco comment: Stopped 75yrs ago  Substance and Sexual Activity  . Alcohol use: Yes    Alcohol/week: 3.0 standard drinks    Types: 3 Standard drinks or equivalent per week    Comment: OCCASIONAL  . Drug use: No  . Sexual activity: Not on file

## 2020-04-05 ENCOUNTER — Ambulatory Visit (INDEPENDENT_AMBULATORY_CARE_PROVIDER_SITE_OTHER): Payer: 59 | Admitting: Rehabilitative and Restorative Service Providers"

## 2020-04-05 ENCOUNTER — Encounter: Payer: Self-pay | Admitting: Rehabilitative and Restorative Service Providers"

## 2020-04-05 ENCOUNTER — Other Ambulatory Visit: Payer: Self-pay

## 2020-04-05 DIAGNOSIS — M25512 Pain in left shoulder: Secondary | ICD-10-CM

## 2020-04-05 DIAGNOSIS — R293 Abnormal posture: Secondary | ICD-10-CM | POA: Diagnosis not present

## 2020-04-05 DIAGNOSIS — M25612 Stiffness of left shoulder, not elsewhere classified: Secondary | ICD-10-CM | POA: Diagnosis not present

## 2020-04-05 DIAGNOSIS — M6281 Muscle weakness (generalized): Secondary | ICD-10-CM

## 2020-04-05 DIAGNOSIS — G8929 Other chronic pain: Secondary | ICD-10-CM | POA: Diagnosis not present

## 2020-04-05 NOTE — Therapy (Signed)
[facility] [place], [place], [address] Phone: [phone]   Fax:  [phone]  Physical Therapy Treatment  Patient Details  Name: Jacob Gibbs MRN: [id number] Date of Birth: Jul 08, 1960 Referring Provider (PT): Leandrew Koyanagi MD   Encounter Date: 04/05/2020   PT End of Session - 04/05/20 1137    Visit Number 2    Number of Visits 22    PT Start Time 0933    PT Stop Time 1017    PT Time Calculation (min) 44 min    Activity Tolerance Patient tolerated treatment well    Behavior During Therapy [facility] for tasks assessed/performed           Past Medical History:  Diagnosis Date  . Anxiety   . Coronary artery disease    a. 2009: mutivessel CAD s/p CABG   b. 01/2016: inf STEMI s/p DES to RCA  . DDD (degenerative disc disease), cervical   . History of tobacco use    REMOTE  . Hyperlipidemia   . PAF (paroxysmal atrial fibrillation) (HCC)    a. brief episode in dec 2005 wtih no recurrence. No indication for [place]  . Sleep apnea    uses cpap  . Syncope and collapse     Past Surgical History:  Procedure Laterality Date  . CARDIAC CATHETERIZATION N/A 02/24/2016   Procedure: Coronary Stent Intervention;  Surgeon: Sherren Mocha, MD;  Location: [place] CV LAB;  Service: Cardiovascular;  Laterality: N/A;  . CARDIAC CATHETERIZATION N/A 02/24/2016   Procedure: Left Heart Cath and Cors/Grafts Angiography;  Surgeon: Sherren Mocha, MD;  Location: [place] CV LAB;  Service: Cardiovascular;  Laterality: N/A;  . CORONARY ARTERY BYPASS GRAFT  02/06/08   X 3  . SHOULDER ARTHROSCOPY WITH ROTATOR CUFF REPAIR AND SUBACROMIAL DECOMPRESSION Right 09/21/2018   Procedure: RIGHT SHOULDER ARTHROSCOPY WITH EXTENSIVE DEBRIDEMENT, BICEPS TENODESIS, SUBACROMIAL DECOMPRESSION,  ROTATOR CUFF REPAIR;  Surgeon: Leandrew Koyanagi, MD;  Location: [facility];  Service: Orthopedics;  Laterality: Right;  . SHOULDER ARTHROSCOPY WITH SUBACROMIAL  DECOMPRESSION Left 03/27/2020   Procedure: LEFT SHOULDER ARTHROSCOPY WITH EXTENSIVE DEBRIDEMENT LABRUM AND BICEPS TENDON, SUBACROMIAL DECOMPRESSION, LYSIS OF ADHESIONS, MANIPULATION UNDER ANESTHESIA;  Surgeon: Leandrew Koyanagi, MD;  Location: [place];  Service: Orthopedics;  Laterality: Left;    There were no vitals filed for this visit.   Subjective Assessment - 04/05/20 1135    Subjective Antuan reports good early HEP compliance.  Sleep is difficult.    Pertinent History CABG X 3.  OA C-spine and B shoulders    Limitations Lifting    Patient Stated Goals Return to normal L shoulder activities without pain (reaching, overhead function)    Currently in Pain? Yes    Pain Score 4     Pain Location Shoulder    Pain Orientation Left    Pain Descriptors / Indicators Aching;Tightness    Pain Type Surgical pain;Chronic pain    Pain Onset More than a month ago    Pain Frequency Intermittent    Aggravating Factors  End ROM    Pain Relieving Factors Ice    Effect of Pain on Daily Activities Very stiff shoulder limits all L shoulder function    Multiple Pain Sites No                             [facility] Adult PT Treatment/Exercise - 04/05/20 0001  Exercises   Exercises Shoulder      Shoulder Exercises: Supine   Protraction AAROM;Left;20 reps    External Rotation AROM;Left;10 reps    External Rotation Limitations 10 seconds    Internal Rotation AROM;Left;10 reps;Other (comment)    Internal Rotation Limitations 10 seconds      Shoulder Exercises: Standing   Retraction Strengthening;Both;10 reps    Retraction Limitations 5 seconds (shoulder blade pinches)    Other Standing Exercises Codmans (F/B; L/R; CW; CCW) 20X each      Shoulder Exercises: Pulleys   Flexion 3 minutes    Flexion Limitations Protraction first with palm facing in      Manual Therapy   Manual Therapy Passive ROM    Manual therapy comments IR/ER                  PT  Education - 04/05/20 1136    Education Details Reviewed HEP with corrections made PRN.    Person(s) Educated Patient    Methods Explanation;Demonstration;Tactile cues;Verbal cues    Comprehension Verbal cues required;Need further instruction;Returned demonstration;Verbalized understanding;Tactile cues required               PT Long Term Goals - 04/05/20 1136      PT LONG TERM GOAL #1   Title Improve FOTO score to 66.    Baseline 34    Time 12    Period Weeks    Status On-going      PT LONG TERM GOAL #2   Title Improve L shoulder AROM to 90% of the uninvolved R.    Baseline See objective    Time 12    Period Weeks    Status On-going      PT LONG TERM GOAL #3   Title Improve L shoulder pain to consistently 0-3/10 on the Numeric Pain Rating Scale.    Baseline Can be 7+/10    Time 12    Period Weeks    Status On-going      PT LONG TERM GOAL #4   Title Improve L shoulder strength as assessed by functional scores, subjective self-report and MMT.    Baseline Poor    Time 12    Period Weeks    Status On-going      PT LONG TERM GOAL #5   Title Landin will be independent with his long-term HEP at DC.    Time 12    Period Weeks    Status On-going                 Plan - 04/05/20 1137    Clinical Impression Statement AROM and L shoulder function remain an early emphasis with [facility]'s physical therapy.  Early HEP compliance has been very good and AROM is better as compared to evaluation (subjectively and from clinical observation).  Continue AROM work to reduce stiffness and prepare for more strength and functional activities as appropriate.    Personal Factors and Comorbidities Comorbidity 1    Comorbidities Severe shoulder OA, previous CABG X 3 and cervical OA.    Examination-Activity Limitations Bathing;Dressing;Sleep;Hygiene/Grooming;Bed Mobility;Lift;Reach Overhead;Carry    Examination-Participation Restrictions Occupation;Community Activity    Stability/Clinical  Decision Making Stable/Uncomplicated    Rehab Potential Good    PT Frequency 3x / week    PT Duration 12 weeks    PT Treatment/Interventions ADLs/Self Care Home Management;Moist Heat;Cryotherapy;Therapeutic activities;Therapeutic exercise;Neuromuscular re-education;Patient/family education;Manual techniques;Passive range of motion;Vasopneumatic Device;Joint Manipulations    PT Next Visit Plan Capsular stretching and ROM (Passive,  active, active-assisted)    PT Home Exercise Plan Access Code: [email or web address]    Consulted and Agree with Plan of Care Patient           Patient will benefit from skilled therapeutic intervention in order to improve the following deficits and impairments:  Decreased activity tolerance,Decreased endurance,Decreased range of motion,Decreased mobility,Decreased strength,Hypomobility,Increased edema,Impaired flexibility,Impaired UE functional use,Pain  Visit Diagnosis: Abnormal posture  Stiffness of left shoulder, not elsewhere classified  Chronic left shoulder pain  Muscle weakness (generalized)     Problem List Patient Active Problem List   Diagnosis Date Noted  . Adhesive capsulitis of left shoulder 03/27/2020  . Loose body in shoulder joint, left 03/27/2020  . Impingement syndrome of left shoulder 03/27/2020  . 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 Coronary artery disease   . PAF (paroxysmal atrial fibrillation) (HCC)   . Acute ST elevation myocardial infarction (STEMI) of inferior wall (HCC) 02/24/2016  . HLD (hyperlipidemia) 08/19/2010    Cherlyn Cushing PT, MPT 04/05/2020, 11:39 AM  [facility] Physical Therapy [address] [place], [place], [address] Phone: [phone]   Fax:  [phone]  Name: BRENNYN HAISLEY MRN: [id number] Date of Birth: 11-07-1960

## 2020-04-08 ENCOUNTER — Other Ambulatory Visit: Payer: Self-pay

## 2020-04-08 ENCOUNTER — Encounter: Payer: Self-pay | Admitting: Physical Therapy

## 2020-04-08 ENCOUNTER — Ambulatory Visit (INDEPENDENT_AMBULATORY_CARE_PROVIDER_SITE_OTHER): Payer: 59 | Admitting: Physical Therapy

## 2020-04-08 DIAGNOSIS — M25612 Stiffness of left shoulder, not elsewhere classified: Secondary | ICD-10-CM | POA: Diagnosis not present

## 2020-04-08 DIAGNOSIS — M6281 Muscle weakness (generalized): Secondary | ICD-10-CM | POA: Diagnosis not present

## 2020-04-08 DIAGNOSIS — M25512 Pain in left shoulder: Secondary | ICD-10-CM

## 2020-04-08 DIAGNOSIS — R293 Abnormal posture: Secondary | ICD-10-CM

## 2020-04-08 DIAGNOSIS — G8929 Other chronic pain: Secondary | ICD-10-CM

## 2020-04-08 NOTE — Therapy (Signed)
[facility] Physical Therapy [address] [place], [place], [address] Phone: [phone]   Fax:  [phone]  Physical Therapy Treatment  Patient Details  Name: Jacob Gibbs MRN: [id number] Date of Birth: 24-Feb-1961 Referring Provider (PT): Jacob Kos MD   Encounter Date: 04/08/2020   PT End of Session - 04/08/20 0842    Visit Number 3    Number of Visits 36    PT Start Time 0802    PT Stop Time 0835    PT Time Calculation (min) 33 min    Activity Tolerance Patient tolerated treatment well    Behavior During Therapy [facility] for tasks assessed/performed           Past Medical History:  Diagnosis Date  . Anxiety   . Coronary artery disease    a. 2009: mutivessel CAD s/p CABG   b. 01/2016: inf STEMI s/p DES to RCA  . DDD (degenerative disc disease), cervical   . History of tobacco use    REMOTE  . Hyperlipidemia   . PAF (paroxysmal atrial fibrillation) (HCC)    a. brief episode in dec 2005 wtih no recurrence. No indication for OAC  . Sleep apnea    uses cpap  . Syncope and collapse     Past Surgical History:  Procedure Laterality Date  . CARDIAC CATHETERIZATION N/A 02/24/2016   Procedure: Coronary Stent Intervention;  Surgeon: Jacob Bollman, MD;  Location: [facility] INVASIVE CV LAB;  Service: Cardiovascular;  Laterality: N/A;  . CARDIAC CATHETERIZATION N/A 02/24/2016   Procedure: Left Heart Cath and Cors/Grafts Angiography;  Surgeon: Jacob Bollman, MD;  Location: [facility] INVASIVE CV LAB;  Service: Cardiovascular;  Laterality: N/A;  . CORONARY ARTERY BYPASS GRAFT  02/06/08   X 3  . SHOULDER ARTHROSCOPY WITH ROTATOR CUFF REPAIR AND SUBACROMIAL DECOMPRESSION Right 09/21/2018   Procedure: RIGHT SHOULDER ARTHROSCOPY WITH EXTENSIVE DEBRIDEMENT, BICEPS TENODESIS, SUBACROMIAL DECOMPRESSION,  ROTATOR CUFF REPAIR;  Surgeon: Jacob Kos, MD;  Location: [facility];  Service: Orthopedics;  Laterality: Right;  . SHOULDER ARTHROSCOPY WITH SUBACROMIAL  DECOMPRESSION Left 03/27/2020   Procedure: LEFT SHOULDER ARTHROSCOPY WITH EXTENSIVE DEBRIDEMENT LABRUM AND BICEPS TENDON, SUBACROMIAL DECOMPRESSION, LYSIS OF ADHESIONS, MANIPULATION UNDER ANESTHESIA;  Surgeon: Jacob Kos, MD;  Location: [facility];  Service: Orthopedics;  Laterality: Left;    There were no vitals filed for this visit.   Subjective Assessment - 04/08/20 0839    Subjective He relays not that much pain at rest but still with a lot of pain at his end ROM    Pertinent History CABG X 3.  OA C-spine and B shoulders    Limitations Lifting    Patient Stated Goals Return to normal L shoulder activities without pain (reaching, overhead function)    Pain Onset More than a month ago             [facility] Adult PT Treatment/Exercise - 04/08/20 0001      Shoulder Exercises: Supine   Protraction AROM;Left;10 reps    Protraction Limitations hold 10 sec    External Rotation AROM;Left;10 reps    External Rotation Limitations 10 seconds    Internal Rotation AROM;Left;10 reps;Other (comment)    Internal Rotation Limitations 10 seconds    Flexion Left;AAROM;10 reps    Flexion Limitations 10 sec      Shoulder Exercises: Seated   Abduction Left;10 reps    ABduction Limitations table slide 10 sec      Shoulder Exercises: Standing   Retraction  Strengthening;Both;10 reps    Retraction Limitations 5 seconds (shoulder blade pinches)      Shoulder Exercises: Pulleys   Flexion 3 minutes    ABduction 3 minutes      Manual Therapy   Manual therapy comments Lt shoulder PROM all planes to tolerance, GH mobs gentle but poor tolernace with this                       PT Long Term Goals - 04/05/20 1136      PT LONG TERM GOAL #1   Title Improve FOTO score to 66.    Baseline 34    Time 12    Period Weeks    Status On-going      PT LONG TERM GOAL #2   Title Improve L shoulder AROM to 90% of the uninvolved R.    Baseline See objective    Time 12    Period  Weeks    Status On-going      PT LONG TERM GOAL #3   Title Improve L shoulder pain to consistently 0-3/10 on the Numeric Pain Rating Scale.    Baseline Can be 7+/10    Time 12    Period Weeks    Status On-going      PT LONG TERM GOAL #4   Title Improve L shoulder strength as assessed by functional scores, subjective self-report and MMT.    Baseline Poor    Time 12    Period Weeks    Status On-going      PT LONG TERM GOAL #5   Title Jacob Gibbs will be independent with his long-term HEP at DC.    Time 12    Period Weeks    Status On-going                 Plan - 04/08/20 0843    Clinical Impression Statement Session focused on Lt shoulder ROM as this is his biggest deficit. He appeared to have more ROM and improved tolerance with self AAROM vs PROM today. He was encouraged to stretch as much as tolerated at home. PT will continue to work to progress ROM as able.    Personal Factors and Comorbidities Comorbidity 1    Comorbidities Severe shoulder OA, previous CABG X 3 and cervical OA.    Examination-Activity Limitations Bathing;Dressing;Sleep;Hygiene/Grooming;Bed Mobility;Lift;Reach Overhead;Carry    Examination-Participation Restrictions Occupation;Community Activity    Stability/Clinical Decision Making Stable/Uncomplicated    Rehab Potential Good    PT Frequency 3x / week    PT Duration 12 weeks    PT Treatment/Interventions ADLs/Self Care Home Management;Moist Heat;Cryotherapy;Therapeutic activities;Therapeutic exercise;Neuromuscular re-education;Patient/family education;Manual techniques;Passive range of motion;Vasopneumatic Device;Joint Manipulations    PT Next Visit Plan Capsular stretching and ROM (Passive, active, active-assisted)    PT Home Exercise Plan Access Code: [email or web address]    Consulted and Agree with Plan of Care Patient           Patient will benefit from skilled therapeutic intervention in order to improve the following deficits and impairments:  Decreased  activity tolerance,Decreased endurance,Decreased range of motion,Decreased mobility,Decreased strength,Hypomobility,Increased edema,Impaired flexibility,Impaired UE functional use,Pain  Visit Diagnosis: Abnormal posture  Stiffness of left shoulder, not elsewhere classified  Chronic left shoulder pain  Muscle weakness (generalized)     Problem List Patient Active Problem List   Diagnosis Date Noted  . Adhesive capsulitis of left shoulder 03/27/2020  . Loose body in shoulder joint, left 03/27/2020  . Impingement syndrome of left shoulder  03/27/2020  . 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 Coronary artery disease   . PAF (paroxysmal atrial fibrillation) (HCC)   . Acute ST elevation myocardial infarction (STEMI) of inferior wall (HCC) 02/24/2016  . HLD (hyperlipidemia) 08/19/2010    April Manson ,PT,DPT 04/08/2020, 8:44 AM  [facility] Physical Therapy [address] [place], [place], [address] Phone: [phone]   Fax:  [phone]  Name: Jacob Gibbs MRN: [id number] Date of Birth: [birth year]

## 2020-04-10 ENCOUNTER — Other Ambulatory Visit: Payer: Self-pay

## 2020-04-10 ENCOUNTER — Ambulatory Visit (INDEPENDENT_AMBULATORY_CARE_PROVIDER_SITE_OTHER): Payer: 59 | Admitting: Rehabilitative and Restorative Service Providers"

## 2020-04-10 ENCOUNTER — Encounter: Payer: Self-pay | Admitting: Rehabilitative and Restorative Service Providers"

## 2020-04-10 DIAGNOSIS — M25612 Stiffness of left shoulder, not elsewhere classified: Secondary | ICD-10-CM

## 2020-04-10 DIAGNOSIS — M6281 Muscle weakness (generalized): Secondary | ICD-10-CM | POA: Diagnosis not present

## 2020-04-10 DIAGNOSIS — R600 Localized edema: Secondary | ICD-10-CM | POA: Diagnosis not present

## 2020-04-10 DIAGNOSIS — E291 Testicular hypofunction: Secondary | ICD-10-CM | POA: Diagnosis not present

## 2020-04-10 NOTE — Therapy (Signed)
[facility] [address] [place], [place], [address] Phone: [phone]   Fax:  [phone]  Physical Therapy Treatment  Patient Details  Name: Jacob Gibbs MRN: [id number] Date of Birth: 02/23/1961 Referring Provider (PT): Tarry Kos MD   Encounter Date: 04/10/2020   PT End of Session - 04/10/20 1636    Visit Number 4    Number of Visits 36    PT Start Time 0845    PT Stop Time 0925    PT Time Calculation (min) 40 min    Activity Tolerance Patient tolerated treatment well    Behavior During Therapy [facility] for tasks assessed/performed           Past Medical History:  Diagnosis Date  . Anxiety   . Coronary artery disease    a. 2009: mutivessel CAD s/p CABG   b. 01/2016: inf STEMI s/p DES to RCA  . DDD (degenerative disc disease), cervical   . History of tobacco use    REMOTE  . Hyperlipidemia   . PAF (paroxysmal atrial fibrillation) (HCC)    a. brief episode in dec 2005 wtih no recurrence. No indication for OAC  . Sleep apnea    uses cpap  . Syncope and collapse     Past Surgical History:  Procedure Laterality Date  . CARDIAC CATHETERIZATION N/A 02/24/2016   Procedure: Coronary Stent Intervention;  Surgeon: Tonny Bollman, MD;  Location: [facility] INVASIVE CV LAB;  Service: Cardiovascular;  Laterality: N/A;  . CARDIAC CATHETERIZATION N/A 02/24/2016   Procedure: Left Heart Cath and Cors/Grafts Angiography;  Surgeon: Tonny Bollman, MD;  Location: [facility] INVASIVE CV LAB;  Service: Cardiovascular;  Laterality: N/A;  . CORONARY ARTERY BYPASS GRAFT  02/06/08   X 3  . SHOULDER ARTHROSCOPY WITH ROTATOR CUFF REPAIR AND SUBACROMIAL DECOMPRESSION Right 09/21/2018   Procedure: RIGHT SHOULDER ARTHROSCOPY WITH EXTENSIVE DEBRIDEMENT, BICEPS TENODESIS, SUBACROMIAL DECOMPRESSION,  ROTATOR CUFF REPAIR;  Surgeon: Tarry Kos, MD;  Location: [facility];  Service: Orthopedics;  Laterality: Right;  . SHOULDER ARTHROSCOPY WITH SUBACROMIAL  DECOMPRESSION Left 03/27/2020   Procedure: LEFT SHOULDER ARTHROSCOPY WITH EXTENSIVE DEBRIDEMENT LABRUM AND BICEPS TENDON, SUBACROMIAL DECOMPRESSION, LYSIS OF ADHESIONS, MANIPULATION UNDER ANESTHESIA;  Surgeon: Tarry Kos, MD;  Location: [facility];  Service: Orthopedics;  Laterality: Left;    There were no vitals filed for this visit.   Subjective Assessment - 04/10/20 1634    Subjective Jacob Gibbs reports continued excellent HEP compliance.    Pertinent History CABG X 3.  OA C-spine and B shoulders    Limitations Lifting    Patient Stated Goals Return to normal L shoulder activities without pain (reaching, overhead function)    Currently in Pain? Yes    Pain Score 3     Pain Location Shoulder    Pain Orientation Left    Pain Descriptors / Indicators Aching;Tightness;Sore    Pain Type Chronic pain;Surgical pain    Pain Onset More than a month ago    Pain Frequency Intermittent    Aggravating Factors  End AROM    Pain Relieving Factors Ice    Effect of Pain on Daily Activities Very stiff shoulder limits all L UE use, although AROM is improving    Multiple Pain Sites No                             [facility] Adult PT Treatment/Exercise - 04/10/20 0001  Exercises   Exercises Shoulder      Shoulder Exercises: Supine   Protraction AAROM;Left;20 reps    External Rotation AROM;Left;10 reps    External Rotation Limitations 10 seconds    Internal Rotation AROM;Left;10 reps;Other (comment)    Internal Rotation Limitations 10 seconds      Shoulder Exercises: Standing   Retraction Strengthening;Both;10 reps    Retraction Limitations 5 seconds (shoulder blade pinches)    Other Standing Exercises Codmans (F/B; L/R; CW; CCW) 20X each      Shoulder Exercises: Pulleys   Flexion 3 minutes    Flexion Limitations Protraction first with palm facing in      Manual Therapy   Manual Therapy Passive ROM    Manual therapy comments IR/ER                   PT Education - 04/10/20 1636    Education Details Reviewed HEP.    Person(s) Educated Patient    Methods Explanation;Demonstration;Tactile cues;Verbal cues    Comprehension Verbal cues required;Returned demonstration;Need further instruction;Verbalized understanding               PT Long Term Goals - 04/10/20 1636      PT LONG TERM GOAL #1   Title Improve FOTO score to 66.    Baseline 34    Time 12    Period Weeks    Status On-going      PT LONG TERM GOAL #2   Title Improve L shoulder AROM to 90% of the uninvolved R.    Baseline See objective    Time 12    Period Weeks    Status On-going      PT LONG TERM GOAL #3   Title Improve L shoulder pain to consistently 0-3/10 on the Numeric Pain Rating Scale.    Baseline Can be 7+/10    Time 12    Period Weeks    Status On-going      PT LONG TERM GOAL #4   Title Improve L shoulder strength as assessed by functional scores, subjective self-report and MMT.    Baseline Poor    Time 12    Period Weeks    Status On-going      PT LONG TERM GOAL #5   Title Jacob Gibbs will be independent with his long-term HEP at DC.    Time 12    Period Weeks    Status On-going                 Plan - 04/10/20 1637    Clinical Impression Statement Capsular flexibility remains the priority with Jacob Gibbs's physical therapy.  Sleep is interrupted every 2-3 hours and this should improve as AROM improves.    Personal Factors and Comorbidities Comorbidity 1    Comorbidities Severe shoulder OA, previous CABG X 3 and cervical OA.    Examination-Activity Limitations Bathing;Dressing;Sleep;Hygiene/Grooming;Bed Mobility;Lift;Reach Overhead;Carry    Examination-Participation Restrictions Occupation;Community Activity    Stability/Clinical Decision Making Stable/Uncomplicated    Rehab Potential Good    PT Frequency 3x / week    PT Duration 12 weeks    PT Treatment/Interventions ADLs/Self Care Home Management;Moist Heat;Cryotherapy;Therapeutic  activities;Therapeutic exercise;Neuromuscular re-education;Patient/family education;Manual techniques;Passive range of motion;Vasopneumatic Device;Joint Manipulations    PT Next Visit Plan Capsular stretching and ROM (Passive, active, active-assisted)    PT Home Exercise Plan Access Code: [email or web address]    Consulted and Agree with Plan of Care Patient           Patient  will benefit from skilled therapeutic intervention in order to improve the following deficits and impairments:  Decreased activity tolerance,Decreased endurance,Decreased range of motion,Decreased mobility,Decreased strength,Hypomobility,Increased edema,Impaired flexibility,Impaired UE functional use,Pain  Visit Diagnosis: Muscle weakness (generalized)  Stiffness of left shoulder, not elsewhere classified  Localized edema     Problem List Patient Active Problem List   Diagnosis Date Noted  . Adhesive capsulitis of left shoulder 03/27/2020  . Loose body in shoulder joint, left 03/27/2020  . Impingement syndrome of left shoulder 03/27/2020  . 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 Coronary artery disease   . PAF (paroxysmal atrial fibrillation) (HCC)   . Acute ST elevation myocardial infarction (STEMI) of inferior wall (HCC) 02/24/2016  . HLD (hyperlipidemia) 08/19/2010    Cherlyn Cushing PT, MPT 04/10/2020, 4:38 PM  [facility] Physical Therapy [address] [place], [place], [address] Phone: [phone]   Fax:  [phone]  Name: Jacob Gibbs MRN: [id number] Date of Birth: 1960-05-09

## 2020-04-11 ENCOUNTER — Encounter: Payer: BC Managed Care – PPO | Admitting: Rehabilitative and Restorative Service Providers"

## 2020-04-12 ENCOUNTER — Encounter: Payer: Self-pay | Admitting: Rehabilitative and Restorative Service Providers"

## 2020-04-12 ENCOUNTER — Other Ambulatory Visit: Payer: Self-pay

## 2020-04-12 ENCOUNTER — Ambulatory Visit (INDEPENDENT_AMBULATORY_CARE_PROVIDER_SITE_OTHER): Payer: 59 | Admitting: Rehabilitative and Restorative Service Providers"

## 2020-04-12 DIAGNOSIS — M6281 Muscle weakness (generalized): Secondary | ICD-10-CM

## 2020-04-12 DIAGNOSIS — G8929 Other chronic pain: Secondary | ICD-10-CM | POA: Diagnosis not present

## 2020-04-12 DIAGNOSIS — M25612 Stiffness of left shoulder, not elsewhere classified: Secondary | ICD-10-CM | POA: Diagnosis not present

## 2020-04-12 DIAGNOSIS — M25512 Pain in left shoulder: Secondary | ICD-10-CM | POA: Diagnosis not present

## 2020-04-12 DIAGNOSIS — R600 Localized edema: Secondary | ICD-10-CM | POA: Diagnosis not present

## 2020-04-12 NOTE — Therapy (Signed)
[facility] [address] [place], [place], [address] Phone: [phone]   Fax:  [phone]  Physical Therapy Treatment  Patient Details  Name: Jacob Gibbs MRN: [id number] Date of Birth: [birth year] Referring Provider (PT): Tarry Kos MD   Encounter Date: 04/12/2020   PT End of Session - 04/12/20 1310    Visit Number 5    Number of Visits 36    PT Start Time 1143    PT Stop Time 1228    PT Time Calculation (min) 45 min    Activity Tolerance Patient tolerated treatment well    Behavior During Therapy Jacob Gibbs for tasks assessed/performed           Past Medical History:  Diagnosis Date  . Anxiety   . Coronary artery disease    a. 2009: mutivessel CAD s/p CABG   b. 01/2016: inf STEMI s/p DES to RCA  . DDD (degenerative disc disease), cervical   . History of tobacco use    REMOTE  . Hyperlipidemia   . PAF (paroxysmal atrial fibrillation) (HCC)    a. brief episode in dec 2005 wtih no recurrence. No indication for OAC  . Sleep apnea    uses cpap  . Syncope and collapse     Past Surgical History:  Procedure Laterality Date  . CARDIAC CATHETERIZATION N/A 02/24/2016   Procedure: Coronary Stent Intervention;  Surgeon: Tonny Bollman, MD;  Location: [facility] INVASIVE CV LAB;  Service: Cardiovascular;  Laterality: N/A;  . CARDIAC CATHETERIZATION N/A 02/24/2016   Procedure: Left Heart Cath and Cors/Grafts Angiography;  Surgeon: Tonny Bollman, MD;  Location: [facility] INVASIVE CV LAB;  Service: Cardiovascular;  Laterality: N/A;  . CORONARY ARTERY BYPASS GRAFT  02/06/08   X 3  . SHOULDER ARTHROSCOPY WITH ROTATOR CUFF REPAIR AND SUBACROMIAL DECOMPRESSION Right 09/21/2018   Procedure: RIGHT SHOULDER ARTHROSCOPY WITH EXTENSIVE DEBRIDEMENT, BICEPS TENODESIS, SUBACROMIAL DECOMPRESSION,  ROTATOR CUFF REPAIR;  Surgeon: Tarry Kos, MD;  Location: [facility];  Service: Orthopedics;  Laterality: Right;  . SHOULDER ARTHROSCOPY WITH SUBACROMIAL  DECOMPRESSION Left 03/27/2020   Procedure: LEFT SHOULDER ARTHROSCOPY WITH EXTENSIVE DEBRIDEMENT LABRUM AND BICEPS TENDON, SUBACROMIAL DECOMPRESSION, LYSIS OF ADHESIONS, MANIPULATION UNDER ANESTHESIA;  Surgeon: Tarry Kos, MD;  Location: [facility];  Service: Orthopedics;  Laterality: Left;    There were no vitals filed for this visit.   Subjective Assessment - 04/12/20 1304    Subjective Jacob Gibbs reports continued excellent HEP compliance.  Sleep is disturbed by L shoulder pain.    Pertinent History CABG X 3.  OA C-spine and B shoulders    Limitations Lifting    Patient Stated Goals Return to normal L shoulder activities without pain (reaching, overhead function)    Currently in Pain? Yes    Pain Score 5     Pain Location Shoulder    Pain Orientation Left    Pain Descriptors / Indicators Aching;Sore;Tightness    Pain Type Chronic pain;Surgical pain    Pain Onset More than a month ago    Pain Frequency Intermittent    Aggravating Factors  All L UE use and sleeping    Pain Relieving Factors Ice and meds    Effect of Pain on Daily Activities See aggravating factors    Multiple Pain Sites No              [facility] PT Assessment - 04/12/20 0001      AROM   Left Shoulder Flexion 110 Degrees  Left Shoulder Internal Rotation 30 Degrees    Left Shoulder External Rotation 30 Degrees    Left Shoulder Horizontal ADduction 20 Degrees                         [facility] Adult PT Treatment/Exercise - 04/12/20 0001      Exercises   Exercises Shoulder      Shoulder Exercises: Supine   Protraction AAROM;Left;20 reps    External Rotation AROM;Left;10 reps    External Rotation Limitations 10 seconds    Internal Rotation AROM;Left;10 reps;Other (comment)    Internal Rotation Limitations 10 seconds    Flexion AAROM;Left;10 reps;Other (comment)    Flexion Limitations 10 seconds      Shoulder Exercises: Standing   Retraction Strengthening;Both;10 reps    Retraction  Limitations 5 seconds (shoulder blade pinches)    Other Standing Exercises Codmans (F/B; L/R; CW; CCW) 20X each      Shoulder Exercises: Pulleys   Flexion 3 minutes    Flexion Limitations Protraction first with palm facing in    Scaption 3 minutes    Scaption Limitations Protraction first with thumb up      Manual Therapy   Manual Therapy Passive ROM    Manual therapy comments IR/ER                  PT Education - 04/12/20 1307    Education Details Reviewed exam findings today, reviewed and updated clinic program.    Person(s) Educated Patient    Methods Explanation;Demonstration;Tactile cues;Verbal cues    Comprehension Verbal cues required;Need further instruction;Returned demonstration;Verbalized understanding;Tactile cues required               PT Long Term Goals - 04/12/20 1307      PT LONG TERM GOAL #1   Title Improve FOTO score to 66.    Baseline 34    Time 12    Period Weeks    Status On-going      PT LONG TERM GOAL #2   Title Improve L shoulder AROM to 90% of the uninvolved R.    Baseline See objective    Time 12    Period Weeks    Status On-going      PT LONG TERM GOAL #3   Title Improve L shoulder pain to consistently 0-3/10 on the Numeric Pain Rating Scale.    Baseline Can be 7+/10    Time 12    Period Weeks    Status On-going      PT LONG TERM GOAL #4   Title Improve L shoulder strength as assessed by functional scores, subjective self-report and MMT.    Baseline Poor    Time 12    Period Weeks    Status On-going      PT LONG TERM GOAL #5   Title Jacob Gibbs will be independent with his long-term HEP at DC.    Time 12    Period Weeks    Status On-going                 Plan - 04/12/20 1310    Clinical Impression Statement Jacob Gibbs continues to give good effort with his home and clinic program.  AROM has improved in all areas assessed at evaluation (see assessment).  Capsular flexibility and AROM remain the early emphasis with Jacob Gibbs's  physical therapy.    Personal Factors and Comorbidities Comorbidity 1    Comorbidities Severe shoulder OA, previous CABG X  3 and cervical OA.    Examination-Activity Limitations Bathing;Dressing;Sleep;Hygiene/Grooming;Bed Mobility;Lift;Reach Overhead;Carry    Examination-Participation Restrictions Occupation;Community Activity    Stability/Clinical Decision Making Stable/Uncomplicated    Rehab Potential Good    PT Frequency 3x / week    PT Duration 12 weeks    PT Treatment/Interventions ADLs/Self Care Home Management;Moist Heat;Cryotherapy;Therapeutic activities;Therapeutic exercise;Neuromuscular re-education;Patient/family education;Manual techniques;Passive range of motion;Vasopneumatic Device;Joint Manipulations    PT Next Visit Plan Capsular stretching and ROM (Passive, active, active-assisted)    PT Home Exercise Plan Access Code: [email or web address]    Consulted and Agree with Plan of Care Patient           Patient will benefit from skilled therapeutic intervention in order to improve the following deficits and impairments:  Decreased activity tolerance,Decreased endurance,Decreased range of motion,Decreased mobility,Decreased strength,Hypomobility,Increased edema,Impaired flexibility,Impaired UE functional use,Pain  Visit Diagnosis: Muscle weakness (generalized)  Stiffness of left shoulder, not elsewhere classified  Chronic left shoulder pain  Localized edema     Problem List Patient Active Problem List   Diagnosis Date Noted  . Adhesive capsulitis of left shoulder 03/27/2020  . Loose body in shoulder joint, left 03/27/2020  . Impingement syndrome of left shoulder 03/27/2020  . 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 Coronary artery disease   . PAF (paroxysmal atrial fibrillation) (HCC)   . Acute ST elevation myocardial infarction (STEMI) of inferior wall (HCC) 02/24/2016  . HLD (hyperlipidemia) 08/19/2010    Cherlyn Cushing PT, MPT 04/12/2020, 1:12  PM  [facility] [address] [place], [place], [address] Phone: [phone]   Fax:  [phone]  Name: Jacob Gibbs MRN: [id number] Date of Birth: 12/06/1960

## 2020-04-15 ENCOUNTER — Encounter: Payer: BLUE CROSS/BLUE SHIELD | Admitting: Rehabilitative and Restorative Service Providers"

## 2020-04-17 ENCOUNTER — Ambulatory Visit (INDEPENDENT_AMBULATORY_CARE_PROVIDER_SITE_OTHER): Payer: 59 | Admitting: Rehabilitative and Restorative Service Providers"

## 2020-04-17 ENCOUNTER — Encounter: Payer: Self-pay | Admitting: Rehabilitative and Restorative Service Providers"

## 2020-04-17 ENCOUNTER — Other Ambulatory Visit: Payer: Self-pay

## 2020-04-17 DIAGNOSIS — R600 Localized edema: Secondary | ICD-10-CM

## 2020-04-17 DIAGNOSIS — M25612 Stiffness of left shoulder, not elsewhere classified: Secondary | ICD-10-CM | POA: Diagnosis not present

## 2020-04-17 DIAGNOSIS — E291 Testicular hypofunction: Secondary | ICD-10-CM | POA: Diagnosis not present

## 2020-04-17 DIAGNOSIS — M25512 Pain in left shoulder: Secondary | ICD-10-CM | POA: Diagnosis not present

## 2020-04-17 DIAGNOSIS — N5201 Erectile dysfunction due to arterial insufficiency: Secondary | ICD-10-CM | POA: Diagnosis not present

## 2020-04-17 DIAGNOSIS — M6281 Muscle weakness (generalized): Secondary | ICD-10-CM

## 2020-04-17 DIAGNOSIS — R293 Abnormal posture: Secondary | ICD-10-CM

## 2020-04-17 DIAGNOSIS — G8929 Other chronic pain: Secondary | ICD-10-CM | POA: Diagnosis not present

## 2020-04-17 NOTE — Therapy (Signed)
[facility], [place], [address] Phone: [phone]   Fax:  [phone]  Physical Therapy Treatment  Patient Details  Name: Jacob Gibbs MRN: [id number] Date of Birth: 01-17-1961 Referring Provider (PT): Leandrew Koyanagi MD   Encounter Date: 04/17/2020   PT End of Session - 04/17/20 0840    Visit Number 6    Number of Visits 36    Date for PT Re-Evaluation 06/27/20    Progress Note Due on Visit 10    PT Start Time 0801    PT Stop Time 0839    PT Time Calculation (min) 38 min    Activity Tolerance Patient tolerated treatment well    Behavior During Therapy [facility] for tasks assessed/performed           Past Medical History:  Diagnosis Date  . Anxiety   . Coronary artery disease    a. 2009: mutivessel CAD s/p CABG   b. 01/2016: inf STEMI s/p DES to RCA  . DDD (degenerative disc disease), cervical   . History of tobacco use    REMOTE  . Hyperlipidemia   . PAF (paroxysmal atrial fibrillation) (HCC)    a. brief episode in dec 2005 wtih no recurrence. No indication for [place]  . Sleep apnea    uses cpap  . Syncope and collapse     Past Surgical History:  Procedure Laterality Date  . CARDIAC CATHETERIZATION N/A 02/24/2016   Procedure: Coronary Stent Intervention;  Surgeon: Sherren Mocha, MD;  Location: [facility] CV LAB;  Service: Cardiovascular;  Laterality: N/A;  . CARDIAC CATHETERIZATION N/A 02/24/2016   Procedure: Left Heart Cath and Cors/Grafts Angiography;  Surgeon: Sherren Mocha, MD;  Location: [facility] CV LAB;  Service: Cardiovascular;  Laterality: N/A;  . CORONARY ARTERY BYPASS GRAFT  02/06/08   X 3  . SHOULDER ARTHROSCOPY WITH ROTATOR CUFF REPAIR AND SUBACROMIAL DECOMPRESSION Right 09/21/2018   Procedure: RIGHT SHOULDER ARTHROSCOPY WITH EXTENSIVE DEBRIDEMENT, BICEPS TENODESIS, SUBACROMIAL DECOMPRESSION,  ROTATOR CUFF REPAIR;  Surgeon: Leandrew Koyanagi, MD;  Location: [place] [facility];  Service:  Orthopedics;  Laterality: Right;  . SHOULDER ARTHROSCOPY WITH SUBACROMIAL DECOMPRESSION Left 03/27/2020   Procedure: LEFT SHOULDER ARTHROSCOPY WITH EXTENSIVE DEBRIDEMENT LABRUM AND BICEPS TENDON, SUBACROMIAL DECOMPRESSION, LYSIS OF ADHESIONS, MANIPULATION UNDER ANESTHESIA;  Surgeon: Leandrew Koyanagi, MD;  Location: [facility];  Service: Orthopedics;  Laterality: Left;    There were no vitals filed for this visit.   Subjective Assessment - 04/17/20 0804    Subjective Pt. indicated feeling pain in shoulder resting and with end range movement which created issues with sleeping.    Pertinent History CABG X 3.  OA C-spine and B shoulders    Limitations Lifting    Patient Stated Goals Return to normal L shoulder activities without pain (reaching, overhead function)    Currently in Pain? Yes    Pain Score 4     Pain Location Shoulder    Pain Orientation Left;Anterior    Pain Descriptors / Indicators Aching;Sore;Tightness    Pain Onset More than a month ago    Aggravating Factors  sleeping, after exercise pain                             [facility] Adult PT Treatment/Exercise - 04/17/20 0001      Shoulder Exercises: Supine   Other Supine Exercises ER wand contract/relax 5 sec/30 sec x 5 in 45  deg abd    Other Supine Exercises IR sleeper stretch contract/relax 5 sec/30 sec x 5      Shoulder Exercises: Pulleys   Flexion 3 minutes    Scaption 3 minutes      Manual Therapy   Manual therapy comments G4 inferior jt mobs in elevations, AP mobs in ER.  Contract/relax MET for IR/ER mobility to tolerance            Trigger Point Dry Needling - 04/17/20 0001    Education Handout Provided Yes                PT Education - 04/17/20 0841    Education Details DN handout    Person(s) Educated Patient    Methods Explanation;Handout    Comprehension Verbalized understanding               PT Long Term Goals - 04/12/20 1307      PT LONG TERM GOAL #1    Title Improve FOTO score to 66.    Baseline 34    Time 12    Period Weeks    Status On-going      PT LONG TERM GOAL #2   Title Improve L shoulder AROM to 90% of the uninvolved R.    Baseline See objective    Time 12    Period Weeks    Status On-going      PT LONG TERM GOAL #3   Title Improve L shoulder pain to consistently 0-3/10 on the Numeric Pain Rating Scale.    Baseline Can be 7+/10    Time 12    Period Weeks    Status On-going      PT LONG TERM GOAL #4   Title Improve L shoulder strength as assessed by functional scores, subjective self-report and MMT.    Baseline Poor    Time 12    Period Weeks    Status On-going      PT LONG TERM GOAL #5   Title Nicholaus will be independent with his long-term HEP at DC.    Time 12    Period Weeks    Status On-going                 Plan - 04/17/20 0834    Clinical Impression Statement Noted impairment in elevation (limited below 90 deg) and er/ir mobility in 45 deg abduction (limited to < 45 deg upon arrival).  Assessment today revealed mild to moderate end range capsular tightness but also noted muscle guarding/restriction c TrP noted c concordant symptoms in subscap, infraspinatus.  Muscle energy techniques (contract/relax) proved beneficial for improved mobility (15-20) degrees passively in all directions when assessed post manual intervention.  Additional cues given for HEP to limit exacerbation of symptoms c focus on tightness at end range for pain complaints.    Personal Factors and Comorbidities Comorbidity 1    Comorbidities Severe shoulder OA, previous CABG X 3 and cervical OA.    Examination-Activity Limitations Bathing;Dressing;Sleep;Hygiene/Grooming;Bed Mobility;Lift;Reach Overhead;Carry    Examination-Participation Restrictions Occupation;Community Activity    Stability/Clinical Decision Making Stable/Uncomplicated    Rehab Potential Good    PT Frequency 3x / week    PT Duration 12 weeks    PT  Treatment/Interventions ADLs/Self Care Home Management;Moist Heat;Cryotherapy;Therapeutic activities;Therapeutic exercise;Neuromuscular re-education;Patient/family education;Manual techniques;Passive range of motion;Vasopneumatic Device;Joint Manipulations    PT Next Visit Plan Continued end range capsular and myofascial stretching for mobilty gains.  Possible DN for pain symptoms and mobility gains.  PT Home Exercise Plan Access Code: [email or web address]    Consulted and Agree with Plan of Care Patient           Patient will benefit from skilled therapeutic intervention in order to improve the following deficits and impairments:  Decreased activity tolerance,Decreased endurance,Decreased range of motion,Decreased mobility,Decreased strength,Hypomobility,Increased edema,Impaired flexibility,Impaired UE functional use,Pain  Visit Diagnosis: Chronic left shoulder pain  Stiffness of left shoulder, not elsewhere classified  Muscle weakness (generalized)  Localized edema  Abnormal posture     Problem List Patient Active Problem List   Diagnosis Date Noted  . Adhesive capsulitis of left shoulder 03/27/2020  . Loose body in shoulder joint, left 03/27/2020  . Impingement syndrome of left shoulder 03/27/2020  . 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 Coronary artery disease   . PAF (paroxysmal atrial fibrillation) ([place])   . Acute ST elevation myocardial infarction (STEMI) of inferior wall ([place]) 02/24/2016  . HLD (hyperlipidemia) 08/19/2010    Scot Jun, PT, DPT, OCS, ATC 04/17/20  8:42 AM    [facility] [address] [place], [place], [address] Phone: [phone]   Fax:  [phone]  Name: DEJAY KRONK MRN: [id number] Date of Birth: February 16, 1961

## 2020-04-19 ENCOUNTER — Encounter: Payer: BC Managed Care – PPO | Admitting: Rehabilitative and Restorative Service Providers"

## 2020-04-19 DIAGNOSIS — E291 Testicular hypofunction: Secondary | ICD-10-CM | POA: Diagnosis not present

## 2020-04-22 ENCOUNTER — Other Ambulatory Visit: Payer: Self-pay

## 2020-04-22 ENCOUNTER — Encounter: Payer: Self-pay | Admitting: Rehabilitative and Restorative Service Providers"

## 2020-04-22 ENCOUNTER — Ambulatory Visit (INDEPENDENT_AMBULATORY_CARE_PROVIDER_SITE_OTHER): Payer: 59 | Admitting: Rehabilitative and Restorative Service Providers"

## 2020-04-22 DIAGNOSIS — M6281 Muscle weakness (generalized): Secondary | ICD-10-CM | POA: Diagnosis not present

## 2020-04-22 DIAGNOSIS — M25512 Pain in left shoulder: Secondary | ICD-10-CM

## 2020-04-22 DIAGNOSIS — R293 Abnormal posture: Secondary | ICD-10-CM | POA: Diagnosis not present

## 2020-04-22 DIAGNOSIS — G8929 Other chronic pain: Secondary | ICD-10-CM | POA: Diagnosis not present

## 2020-04-22 DIAGNOSIS — R600 Localized edema: Secondary | ICD-10-CM

## 2020-04-22 DIAGNOSIS — M25612 Stiffness of left shoulder, not elsewhere classified: Secondary | ICD-10-CM | POA: Diagnosis not present

## 2020-04-22 NOTE — Therapy (Signed)
[facility] Physical Therapy [address] [place], [place], [address] Phone: [phone]   Fax:  [phone]  Physical Therapy Treatment  Patient Details  Name: Jacob Gibbs MRN: [id number] Date of Birth: 03/15/1961 Referring Provider (PT): Jacob Kos MD   Encounter Date: 04/22/2020   PT End of Session - 04/22/20 0820    Visit Number 7    Number of Visits 36    Date for PT Re-Evaluation 06/27/20    Progress Note Due on Visit 10    PT Start Time 0800    PT Stop Time 0839    PT Time Calculation (min) 39 min    Activity Tolerance Patient tolerated treatment well    Behavior During Therapy [facility] for tasks assessed/performed           Past Medical History:  Diagnosis Date  . Anxiety   . Coronary artery disease    a. 2009: mutivessel CAD s/p CABG   b. 01/2016: inf STEMI s/p DES to RCA  . DDD (degenerative disc disease), cervical   . History of tobacco use    REMOTE  . Hyperlipidemia   . PAF (paroxysmal atrial fibrillation) (HCC)    a. brief episode in dec 2005 wtih no recurrence. No indication for OAC  . Sleep apnea    uses cpap  . Syncope and collapse     Past Surgical History:  Procedure Laterality Date  . CARDIAC CATHETERIZATION N/A 02/24/2016   Procedure: Coronary Stent Intervention;  Surgeon: Jacob Bollman, MD;  Location: [facility] INVASIVE CV LAB;  Service: Cardiovascular;  Laterality: N/A;  . CARDIAC CATHETERIZATION N/A 02/24/2016   Procedure: Left Heart Cath and Cors/Grafts Angiography;  Surgeon: Jacob Bollman, MD;  Location: [facility] INVASIVE CV LAB;  Service: Cardiovascular;  Laterality: N/A;  . CORONARY ARTERY BYPASS GRAFT  02/06/08   X 3  . SHOULDER ARTHROSCOPY WITH ROTATOR CUFF REPAIR AND SUBACROMIAL DECOMPRESSION Right 09/21/2018   Procedure: RIGHT SHOULDER ARTHROSCOPY WITH EXTENSIVE DEBRIDEMENT, BICEPS TENODESIS, SUBACROMIAL DECOMPRESSION,  ROTATOR CUFF REPAIR;  Surgeon: Jacob Kos, MD;  Location: [facility];  Service:  Orthopedics;  Laterality: Right;  . SHOULDER ARTHROSCOPY WITH SUBACROMIAL DECOMPRESSION Left 03/27/2020   Procedure: LEFT SHOULDER ARTHROSCOPY WITH EXTENSIVE DEBRIDEMENT LABRUM AND BICEPS TENDON, SUBACROMIAL DECOMPRESSION, LYSIS OF ADHESIONS, MANIPULATION UNDER ANESTHESIA;  Surgeon: Jacob Kos, MD;  Location: [facility];  Service: Orthopedics;  Laterality: Left;    There were no vitals filed for this visit.   Subjective Assessment - 04/22/20 0817    Subjective Pt. stated feeling some improvement at times in movement.  Pt. indicated nighttime can still tough and improved some c HEP in midst of night.  Deferred dry needling option today (maybe Wed per Pt.)    Pertinent History CABG X 3.  OA C-spine and B shoulders    Limitations Lifting    Patient Stated Goals Return to normal L shoulder activities without pain (reaching, overhead function)    Currently in Pain? Yes    Pain Score --   mild anterior shoulder, chest wall Lt   Pain Orientation Left    Pain Onset More than a month ago    Aggravating Factors  nighttime, various insidious during day                             [facility] Adult PT Treatment/Exercise - 04/22/20 0001      Shoulder Exercises: Sidelying   Other Sidelying  Exercises isometric resistance holds manually for ER in neutral 15 sec x 5      Shoulder Exercises: Standing   Other Standing Exercises standing wall flexion slides c Lt LE step in bilateral x 10      Shoulder Exercises: ROM/Strengthening   UBE (Upper Arm Bike) Lvl 3.5 3 mins fwd/back each way      Shoulder Exercises: Stretch   Other Shoulder Stretches doorway arm at side ER stretch 30 sec x 5      Manual Therapy   Manual therapy comments g4 inferior jt mobs in flexion, scaption.  compression to Lt infraspinatus, Lat                       PT Long Term Goals - 04/12/20 1307      PT LONG TERM GOAL #1   Title Improve FOTO score to 66.    Baseline 34    Time 12     Period Weeks    Status On-going      PT LONG TERM GOAL #2   Title Improve L shoulder AROM to 90% of the uninvolved R.    Baseline See objective    Time 12    Period Weeks    Status On-going      PT LONG TERM GOAL #3   Title Improve L shoulder pain to consistently 0-3/10 on the Numeric Pain Rating Scale.    Baseline Can be 7+/10    Time 12    Period Weeks    Status On-going      PT LONG TERM GOAL #4   Title Improve L shoulder strength as assessed by functional scores, subjective self-report and MMT.    Baseline Poor    Time 12    Period Weeks    Status On-going      PT LONG TERM GOAL #5   Title Jacob Gibbs will be independent with his long-term HEP at DC.    Time 12    Period Weeks    Status On-going                 Plan - 04/22/20 0825    Clinical Impression Statement Conitnued capsular pattern limitation paired c myofascial limitations in Lt UE, making slow buty steady progress c intervention in clinic.  Continued nighttime pain in anterior shoulder reported, not just c end range movement..    Personal Factors and Comorbidities Comorbidity 1    Comorbidities Severe shoulder OA, previous CABG X 3 and cervical OA.    Examination-Activity Limitations Bathing;Dressing;Sleep;Hygiene/Grooming;Bed Mobility;Lift;Reach Overhead;Carry    Examination-Participation Restrictions Occupation;Community Activity    Stability/Clinical Decision Making Stable/Uncomplicated    Rehab Potential Good    PT Frequency 3x / week    PT Duration 12 weeks    PT Treatment/Interventions ADLs/Self Care Home Management;Moist Heat;Cryotherapy;Therapeutic activities;Therapeutic exercise;Neuromuscular re-education;Patient/family education;Manual techniques;Passive range of motion;Vasopneumatic Device;Joint Manipulations    PT Next Visit Plan Continued end range capsular and myofascial stretching for mobilty gains.  Possible DN for pain symptoms and mobility gains.    PT Home Exercise Plan Access Code:  [email or web address]    Consulted and Agree with Plan of Care Patient           Patient will benefit from skilled therapeutic intervention in order to improve the following deficits and impairments:  Decreased activity tolerance,Decreased endurance,Decreased range of motion,Decreased mobility,Decreased strength,Hypomobility,Increased edema,Impaired flexibility,Impaired UE functional use,Pain  Visit Diagnosis: Chronic left shoulder pain  Stiffness of left shoulder, not elsewhere  classified  Muscle weakness (generalized)  Localized edema  Abnormal posture     Problem List Patient Active Problem List   Diagnosis Date Noted  . Adhesive capsulitis of left shoulder 03/27/2020  . Loose body in shoulder joint, left 03/27/2020  . Impingement syndrome of left shoulder 03/27/2020  . 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 Coronary artery disease   . PAF (paroxysmal atrial fibrillation) (HCC)   . Acute ST elevation myocardial infarction (STEMI) of inferior wall (HCC) 02/24/2016  . HLD (hyperlipidemia) 08/19/2010    Chyrel Masson, PT, DPT, OCS, ATC 04/22/20  8:34 AM    [facility] Physical Therapy [address] [place], [place], [address] Phone: [phone]   Fax:  [phone]  Name: Jacob Gibbs MRN: [id number] Date of Birth: 04-19-1960

## 2020-04-24 ENCOUNTER — Other Ambulatory Visit: Payer: Self-pay

## 2020-04-24 ENCOUNTER — Ambulatory Visit (INDEPENDENT_AMBULATORY_CARE_PROVIDER_SITE_OTHER): Payer: 59 | Admitting: Rehabilitative and Restorative Service Providers"

## 2020-04-24 ENCOUNTER — Encounter: Payer: Self-pay | Admitting: Rehabilitative and Restorative Service Providers"

## 2020-04-24 DIAGNOSIS — R600 Localized edema: Secondary | ICD-10-CM

## 2020-04-24 DIAGNOSIS — M6281 Muscle weakness (generalized): Secondary | ICD-10-CM

## 2020-04-24 DIAGNOSIS — M25612 Stiffness of left shoulder, not elsewhere classified: Secondary | ICD-10-CM

## 2020-04-24 DIAGNOSIS — G8929 Other chronic pain: Secondary | ICD-10-CM

## 2020-04-24 DIAGNOSIS — R293 Abnormal posture: Secondary | ICD-10-CM | POA: Diagnosis not present

## 2020-04-24 DIAGNOSIS — M25512 Pain in left shoulder: Secondary | ICD-10-CM | POA: Diagnosis not present

## 2020-04-24 NOTE — Therapy (Signed)
[facility] Physical Therapy [address] [place], [place], [address] Phone: [phone]   Fax:  [phone]  Physical Therapy Treatment  Patient Details  Name: Jacob Gibbs MRN: [id number] Date of Birth: [birth year] Referring Provider (PT): Tarry Kos MD   Encounter Date: 04/24/2020   PT End of Session - 04/24/20 0814    Visit Number 8    Number of Visits 36    Date for PT Re-Evaluation 06/27/20    Progress Note Due on Visit 10    PT Start Time 0801    PT Stop Time 0840    PT Time Calculation (min) 39 min    Activity Tolerance Patient tolerated treatment well    Behavior During Therapy [facility] for tasks assessed/performed           Past Medical History:  Diagnosis Date  . Anxiety   . Coronary artery disease    a. 2009: mutivessel CAD s/p CABG   b. 01/2016: inf STEMI s/p DES to RCA  . DDD (degenerative disc disease), cervical   . History of tobacco use    REMOTE  . Hyperlipidemia   . PAF (paroxysmal atrial fibrillation) (HCC)    a. brief episode in dec 2005 wtih no recurrence. No indication for OAC  . Sleep apnea    uses cpap  . Syncope and collapse     Past Surgical History:  Procedure Laterality Date  . CARDIAC CATHETERIZATION N/A 02/24/2016   Procedure: Coronary Stent Intervention;  Surgeon: Tonny Bollman, MD;  Location: [facility] INVASIVE CV LAB;  Service: Cardiovascular;  Laterality: N/A;  . CARDIAC CATHETERIZATION N/A 02/24/2016   Procedure: Left Heart Cath and Cors/Grafts Angiography;  Surgeon: Tonny Bollman, MD;  Location: [facility] INVASIVE CV LAB;  Service: Cardiovascular;  Laterality: N/A;  . CORONARY ARTERY BYPASS GRAFT  02/06/08   X 3  . SHOULDER ARTHROSCOPY WITH ROTATOR CUFF REPAIR AND SUBACROMIAL DECOMPRESSION Right 09/21/2018   Procedure: RIGHT SHOULDER ARTHROSCOPY WITH EXTENSIVE DEBRIDEMENT, BICEPS TENODESIS, SUBACROMIAL DECOMPRESSION,  ROTATOR CUFF REPAIR;  Surgeon: Tarry Kos, MD;  Location: [facility];  Service:  Orthopedics;  Laterality: Right;  . SHOULDER ARTHROSCOPY WITH SUBACROMIAL DECOMPRESSION Left 03/27/2020   Procedure: LEFT SHOULDER ARTHROSCOPY WITH EXTENSIVE DEBRIDEMENT LABRUM AND BICEPS TENDON, SUBACROMIAL DECOMPRESSION, LYSIS OF ADHESIONS, MANIPULATION UNDER ANESTHESIA;  Surgeon: Tarry Kos, MD;  Location: [facility];  Service: Orthopedics;  Laterality: Left;    There were no vitals filed for this visit.   Subjective Assessment - 04/24/20 0813    Subjective Pt. indicated similar complaints overall since last visit c pain noted at nighttime and at various times during the day.    Pertinent History CABG X 3.  OA C-spine and B shoulders    Limitations Lifting    Patient Stated Goals Return to normal L shoulder activities without pain (reaching, overhead function)    Pain Onset More than a month ago                             [facility] Adult PT Treatment/Exercise - 04/24/20 0001      Shoulder Exercises: Standing   Other Standing Exercises standing UE ranger flexion 3 second hold x 20      Shoulder Exercises: Pulleys   Flexion 2 minutes    ABduction 2 minutes      Shoulder Exercises: ROM/Strengthening   UBE (Upper Arm Bike) Lvl 3 3 mins fwd/back each way  Shoulder Exercises: Stretch   Other Shoulder Stretches sidelying sleeper stretch 30 sec x 5      Manual Therapy   Manual therapy comments compression to Lt infraspinatus            Trigger Point Dry Needling - 04/24/20 0001    Consent Given? Yes    Education Handout Provided Previously provided    Muscles Treated Upper Quadrant Infraspinatus   Lt   Infraspinatus Response Twitch response elicited                     PT Long Term Goals - 04/12/20 1307      PT LONG TERM GOAL #1   Title Improve FOTO score to 66.    Baseline 34    Time 12    Period Weeks    Status On-going      PT LONG TERM GOAL #2   Title Improve L shoulder AROM to 90% of the uninvolved R.    Baseline  See objective    Time 12    Period Weeks    Status On-going      PT LONG TERM GOAL #3   Title Improve L shoulder pain to consistently 0-3/10 on the Numeric Pain Rating Scale.    Baseline Can be 7+/10    Time 12    Period Weeks    Status On-going      PT LONG TERM GOAL #4   Title Improve L shoulder strength as assessed by functional scores, subjective self-report and MMT.    Baseline Poor    Time 12    Period Weeks    Status On-going      PT LONG TERM GOAL #5   Title Jermon will be independent with his long-term HEP at [place].    Time 12    Period Weeks    Status On-going                 Plan - 04/24/20 2952    Clinical Impression Statement Strong localized response in posterior shoulder from infraspinatus TrP treatment today.  Posterior capsule tightness as well as myofascial restriction playing limiting factors in IR/HBB movement.  Reduction of frequecy of visits at this time to 2x/week c shifting to more focus on continued HEP use.    Personal Factors and Comorbidities Comorbidity 1    Comorbidities Severe shoulder OA, previous CABG X 3 and cervical OA.    Examination-Activity Limitations Bathing;Dressing;Sleep;Hygiene/Grooming;Bed Mobility;Lift;Reach Overhead;Carry    Examination-Participation Restrictions Occupation;Community Activity    Stability/Clinical Decision Making Stable/Uncomplicated    Rehab Potential Good    PT Frequency 2x / week    PT Duration 12 weeks    PT Treatment/Interventions ADLs/Self Care Home Management;Moist Heat;Cryotherapy;Therapeutic activities;Therapeutic exercise;Neuromuscular re-education;Patient/family education;Manual techniques;Passive range of motion;Vasopneumatic Device;Joint Manipulations    PT Next Visit Plan Follow up on anterior shoulder pain changes following manual today, continue progression of capsular stretching    PT Home Exercise Plan Access Code: [email or web address]    Consulted and Agree with Plan of Care Patient           Patient  will benefit from skilled therapeutic intervention in order to improve the following deficits and impairments:  Decreased activity tolerance,Decreased endurance,Decreased range of motion,Decreased mobility,Decreased strength,Hypomobility,Increased edema,Impaired flexibility,Impaired UE functional use,Pain  Visit Diagnosis: Chronic left shoulder pain  Stiffness of left shoulder, not elsewhere classified  Muscle weakness (generalized)  Localized edema  Abnormal posture     Problem List Patient Active Problem  List   Diagnosis Date Noted  . Adhesive capsulitis of left shoulder 03/27/2020  . Loose body in shoulder joint, left 03/27/2020  . Impingement syndrome of left shoulder 03/27/2020  . 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 Coronary artery disease   . PAF (paroxysmal atrial fibrillation) (HCC)   . Acute ST elevation myocardial infarction (STEMI) of inferior wall (HCC) 02/24/2016  . HLD (hyperlipidemia) 08/19/2010   Chyrel Masson, PT, DPT, OCS, ATC 04/24/20  8:32 AM    [facility] Physical Therapy [address] [place], [place], [address] Phone: [phone]   Fax:  [phone]  Name: KAILON TREESE MRN: [id number] Date of Birth: Jul 29, 1960

## 2020-04-26 ENCOUNTER — Other Ambulatory Visit: Payer: Self-pay | Admitting: Cardiovascular Disease

## 2020-04-26 ENCOUNTER — Encounter: Payer: BC Managed Care – PPO | Admitting: Rehabilitative and Restorative Service Providers"

## 2020-04-29 ENCOUNTER — Other Ambulatory Visit: Payer: BLUE CROSS/BLUE SHIELD

## 2020-04-29 ENCOUNTER — Encounter: Payer: BLUE CROSS/BLUE SHIELD | Admitting: Rehabilitative and Restorative Service Providers"

## 2020-04-29 ENCOUNTER — Telehealth: Payer: Self-pay | Admitting: Rehabilitative and Restorative Service Providers"

## 2020-04-29 DIAGNOSIS — Z20822 Contact with and (suspected) exposure to covid-19: Secondary | ICD-10-CM

## 2020-04-29 NOTE — Telephone Encounter (Signed)
Pt. Indicated he was feeling ill and fever and wasn't able to make today's appointment.   Chyrel Masson, PT, DPT, OCS, ATC 04/29/20  8:23 AM

## 2020-05-01 ENCOUNTER — Encounter: Payer: BC Managed Care – PPO | Admitting: Rehabilitative and Restorative Service Providers"

## 2020-05-01 LAB — SARS-COV-2, NAA 2 DAY TAT

## 2020-05-01 LAB — NOVEL CORONAVIRUS, NAA: SARS-CoV-2, NAA: NOT DETECTED

## 2020-05-03 ENCOUNTER — Encounter: Payer: Self-pay | Admitting: Rehabilitative and Restorative Service Providers"

## 2020-05-03 ENCOUNTER — Ambulatory Visit (INDEPENDENT_AMBULATORY_CARE_PROVIDER_SITE_OTHER): Payer: BLUE CROSS/BLUE SHIELD | Admitting: Rehabilitative and Restorative Service Providers"

## 2020-05-03 ENCOUNTER — Other Ambulatory Visit: Payer: Self-pay

## 2020-05-03 DIAGNOSIS — M25512 Pain in left shoulder: Secondary | ICD-10-CM

## 2020-05-03 DIAGNOSIS — R600 Localized edema: Secondary | ICD-10-CM | POA: Diagnosis not present

## 2020-05-03 DIAGNOSIS — M6281 Muscle weakness (generalized): Secondary | ICD-10-CM

## 2020-05-03 DIAGNOSIS — M25612 Stiffness of left shoulder, not elsewhere classified: Secondary | ICD-10-CM

## 2020-05-03 DIAGNOSIS — G8929 Other chronic pain: Secondary | ICD-10-CM | POA: Diagnosis not present

## 2020-05-03 NOTE — Therapy (Signed)
[facility] Physical Therapy [address] [place], [place], [address] Phone: [phone]   Fax:  [phone]  Physical Therapy Treatment/Recertification  Patient Details  Name: Jacob Gibbs MRN: [id number] Date of Birth: 1960/12/14 Referring Provider (PT): Tarry Kos MD   Encounter Date: 05/03/2020   PT End of Session - 05/03/20 1140    Visit Number 9    Number of Visits 36    Date for PT Re-Evaluation 06/27/20    Progress Note Due on Visit 19    PT Start Time 0802    PT Stop Time 0845    PT Time Calculation (min) 43 min    Activity Tolerance Patient tolerated treatment well    Behavior During Therapy [facility] for tasks assessed/performed           Past Medical History:  Diagnosis Date  . Anxiety   . Coronary artery disease    a. 2009: mutivessel CAD s/p CABG   b. 01/2016: inf STEMI s/p DES to RCA  . DDD (degenerative disc disease), cervical   . History of tobacco use    REMOTE  . Hyperlipidemia   . PAF (paroxysmal atrial fibrillation) (HCC)    a. brief episode in dec 2005 wtih no recurrence. No indication for OAC  . Sleep apnea    uses cpap  . Syncope and collapse     Past Surgical History:  Procedure Laterality Date  . CARDIAC CATHETERIZATION N/A 02/24/2016   Procedure: Coronary Stent Intervention;  Surgeon: Tonny Bollman, MD;  Location: [facility] INVASIVE CV LAB;  Service: Cardiovascular;  Laterality: N/A;  . CARDIAC CATHETERIZATION N/A 02/24/2016   Procedure: Left Heart Cath and Cors/Grafts Angiography;  Surgeon: Tonny Bollman, MD;  Location: [facility] INVASIVE CV LAB;  Service: Cardiovascular;  Laterality: N/A;  . CORONARY ARTERY BYPASS GRAFT  02/06/08   X 3  . SHOULDER ARTHROSCOPY WITH ROTATOR CUFF REPAIR AND SUBACROMIAL DECOMPRESSION Right 09/21/2018   Procedure: RIGHT SHOULDER ARTHROSCOPY WITH EXTENSIVE DEBRIDEMENT, BICEPS TENODESIS, SUBACROMIAL DECOMPRESSION,  ROTATOR CUFF REPAIR;  Surgeon: Tarry Kos, MD;  Location: [facility];   Service: Orthopedics;  Laterality: Right;  . SHOULDER ARTHROSCOPY WITH SUBACROMIAL DECOMPRESSION Left 03/27/2020   Procedure: LEFT SHOULDER ARTHROSCOPY WITH EXTENSIVE DEBRIDEMENT LABRUM AND BICEPS TENDON, SUBACROMIAL DECOMPRESSION, LYSIS OF ADHESIONS, MANIPULATION UNDER ANESTHESIA;  Surgeon: Tarry Kos, MD;  Location: [facility];  Service: Orthopedics;  Laterality: Left;    There were no vitals filed for this visit.   Subjective Assessment - 05/03/20 0828    Subjective Jacob Gibbs reports poor HEP compliance the past few days due to being very sick.    Pertinent History CABG X 3.  OA C-spine and B shoulders    Limitations Lifting    Patient Stated Goals Return to normal L shoulder activities without pain (reaching, overhead function)    Currently in Pain? Yes    Pain Score 4     Pain Location Shoulder    Pain Orientation Left    Pain Descriptors / Indicators Aching;Tightness;Sore    Pain Type Chronic pain;Surgical pain    Pain Onset More than a month ago    Pain Frequency Intermittent    Aggravating Factors  L shoulder use    Pain Relieving Factors Ice and tylenol    Effect of Pain on Daily Activities Limits all L UE function    Multiple Pain Sites No              [facility] PT Assessment - 05/03/20  0001      Observation/Other Assessments   Focus on Therapeutic Outcomes (FOTO)  52 (was 34, Goal 66)      ROM / Strength   AROM / PROM / Strength AROM      AROM   Overall AROM  Deficits    AROM Assessment Site Shoulder    Right/Left Shoulder Left    Left Shoulder Flexion 120 Degrees    Left Shoulder Internal Rotation 25 Degrees    Left Shoulder External Rotation 40 Degrees    Left Shoulder Horizontal ADduction 40 Degrees                         [facility] Adult PT Treatment/Exercise - 05/03/20 0001      Exercises   Exercises Shoulder      Shoulder Exercises: Supine   External Rotation AROM;Left;10 reps    External Rotation Limitations 10 seconds     Internal Rotation AROM;Left;10 reps;Other (comment)    Internal Rotation Limitations 10 seconds    Flexion AAROM;Left;10 reps;Other (comment)    Flexion Limitations 10 seconds      Shoulder Exercises: Sidelying   Internal Rotation AROM;Left;10 reps;Other (comment)    Internal Rotation Limitations 10 seconds      Shoulder Exercises: Standing   Retraction Strengthening;Both;10 reps    Retraction Limitations 5 seconds (shoulder blade pinches)      Shoulder Exercises: Pulleys   Flexion 2 minutes    Flexion Limitations Protraction first with palm facing in    Scaption 2 minutes    Scaption Limitations Protraction first with thumb up      Manual Therapy   Manual Therapy Passive ROM    Manual therapy comments IR/ER                  PT Education - 05/03/20 1139    Education Details Reviewed HEP and exam findings.    Person(s) Educated Patient    Methods Explanation;Demonstration;Tactile cues;Verbal cues    Comprehension Verbal cues required;Need further instruction;Returned demonstration;Verbalized understanding;Tactile cues required               PT Long Term Goals - 05/03/20 1139      PT LONG TERM GOAL #1   Title Improve FOTO score to 66.    Baseline 52, was 34    Time 12    Period Weeks    Status On-going      PT LONG TERM GOAL #2   Title Improve L shoulder AROM to 90% of the uninvolved R.    Baseline Improving, see objective    Time 12    Period Weeks    Status On-going      PT LONG TERM GOAL #3   Title Improve L shoulder pain to consistently 0-3/10 on the Numeric Pain Rating Scale.    Baseline Can be 7+/10    Time 12    Period Weeks    Status On-going      PT LONG TERM GOAL #4   Title Improve L shoulder strength as assessed by functional scores, subjective self-report and MMT.    Baseline Poor    Time 12    Period Weeks    Status On-going      PT LONG TERM GOAL #5   Title Jacob Gibbs will be independent with his long-term HEP at DC.    Time 12     Period Weeks    Status On-going  Plan - 05/03/20 1140    Clinical Impression Statement Jacob Gibbs is making measurable objective progress with his L shoulder AROM.  Although significant progress is noted (see objective) Jacob Gibbs remains very stiff and will benefit from continued physical therapy to improve AROM, capsular flexibility, strength and function.    Personal Factors and Comorbidities Comorbidity 1    Comorbidities Severe shoulder OA, previous CABG X 3 and cervical OA.    Examination-Activity Limitations Bathing;Dressing;Sleep;Hygiene/Grooming;Bed Mobility;Lift;Reach Overhead;Carry    Examination-Participation Restrictions Occupation;Community Activity    Stability/Clinical Decision Making Stable/Uncomplicated    Rehab Potential Good    PT Frequency 2x / week    PT Duration 6 weeks    PT Treatment/Interventions ADLs/Self Care Home Management;Moist Heat;Cryotherapy;Therapeutic activities;Therapeutic exercise;Neuromuscular re-education;Patient/family education;Manual techniques;Passive range of motion;Vasopneumatic Device;Joint Manipulations    PT Next Visit Plan Follow up on anterior shoulder pain changes following manual today, continue progression of capsular stretching    PT Home Exercise Plan Access Code: [email or web address]    Consulted and Agree with Plan of Care Patient           Patient will benefit from skilled therapeutic intervention in order to improve the following deficits and impairments:  Decreased activity tolerance,Decreased endurance,Decreased range of motion,Decreased mobility,Decreased strength,Hypomobility,Increased edema,Impaired flexibility,Impaired UE functional use,Pain  Visit Diagnosis: Chronic left shoulder pain  Stiffness of left shoulder, not elsewhere classified  Muscle weakness (generalized)  Localized edema     Problem List Patient Active Problem List   Diagnosis Date Noted  . Adhesive capsulitis of left shoulder 03/27/2020  . Loose  body in shoulder joint, left 03/27/2020  . Impingement syndrome of left shoulder 03/27/2020  . 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 Coronary artery disease   . PAF (paroxysmal atrial fibrillation) (HCC)   . Acute ST elevation myocardial infarction (STEMI) of inferior wall (HCC) 02/24/2016  . HLD (hyperlipidemia) 08/19/2010    Cherlyn Cushing PT, MPT 05/03/2020, 11:43 AM  [facility] Physical Therapy [address] [place], [place], [address] Phone: [phone]   Fax:  [phone]  Name: Jacob Gibbs MRN: [id number] Date of Birth: [birth year]

## 2020-05-08 ENCOUNTER — Ambulatory Visit (INDEPENDENT_AMBULATORY_CARE_PROVIDER_SITE_OTHER): Payer: BLUE CROSS/BLUE SHIELD | Admitting: Orthopaedic Surgery

## 2020-05-08 ENCOUNTER — Ambulatory Visit (INDEPENDENT_AMBULATORY_CARE_PROVIDER_SITE_OTHER): Payer: BLUE CROSS/BLUE SHIELD | Admitting: Rehabilitative and Restorative Service Providers"

## 2020-05-08 ENCOUNTER — Encounter: Payer: Self-pay | Admitting: Orthopaedic Surgery

## 2020-05-08 ENCOUNTER — Other Ambulatory Visit: Payer: Self-pay

## 2020-05-08 ENCOUNTER — Encounter: Payer: Self-pay | Admitting: Rehabilitative and Restorative Service Providers"

## 2020-05-08 DIAGNOSIS — M25612 Stiffness of left shoulder, not elsewhere classified: Secondary | ICD-10-CM

## 2020-05-08 DIAGNOSIS — Z9889 Other specified postprocedural states: Secondary | ICD-10-CM

## 2020-05-08 DIAGNOSIS — M7502 Adhesive capsulitis of left shoulder: Secondary | ICD-10-CM

## 2020-05-08 DIAGNOSIS — M25512 Pain in left shoulder: Secondary | ICD-10-CM

## 2020-05-08 DIAGNOSIS — M6281 Muscle weakness (generalized): Secondary | ICD-10-CM | POA: Diagnosis not present

## 2020-05-08 DIAGNOSIS — R293 Abnormal posture: Secondary | ICD-10-CM

## 2020-05-08 DIAGNOSIS — G8929 Other chronic pain: Secondary | ICD-10-CM

## 2020-05-08 DIAGNOSIS — R600 Localized edema: Secondary | ICD-10-CM | POA: Diagnosis not present

## 2020-05-08 MED ORDER — PENNSAID 2 % EX SOLN: 2.0000 g | Freq: Two times a day (BID) | CUTANEOUS | 3 refills | Status: DC | PRN

## 2020-05-08 NOTE — Therapy (Signed)
[place], [place], [address] Phone: [phone]   Fax:  [phone]  Physical Therapy Treatment  Patient Details  Name: Jacob Gibbs MRN: [id number] Date of Birth: 06/30/1960 Referring Provider (PT): Leandrew Koyanagi MD   Encounter Date: 05/08/2020   PT End of Session - 05/08/20 0941    Visit Number 10    Number of Visits 36    Date for PT Re-Evaluation 06/27/20    Progress Note Due on Visit 19    PT Start Time 0933    PT Stop Time 1012    PT Time Calculation (min) 39 min    Activity Tolerance Patient tolerated treatment well    Behavior During Therapy [facility] for tasks assessed/performed           Past Medical History:  Diagnosis Date  . Anxiety   . Coronary artery disease    a. 2009: mutivessel CAD s/p CABG   b. 01/2016: inf STEMI s/p DES to RCA  . DDD (degenerative disc disease), cervical   . History of tobacco use    REMOTE  . Hyperlipidemia   . PAF (paroxysmal atrial fibrillation) (HCC)    a. brief episode in dec 2005 wtih no recurrence. No indication for [place]  . Sleep apnea    uses cpap  . Syncope and collapse     Past Surgical History:  Procedure Laterality Date  . CARDIAC CATHETERIZATION N/A 02/24/2016   Procedure: Coronary Stent Intervention;  Surgeon: Sherren Mocha, MD;  Location: [place] [facility];  Service: Cardiovascular;  Laterality: N/A;  . CARDIAC CATHETERIZATION N/A 02/24/2016   Procedure: Left Heart Cath and Cors/Grafts Angiography;  Surgeon: Sherren Mocha, MD;  Location: [place] CV [facility];  Service: Cardiovascular;  Laterality: N/A;  . CORONARY ARTERY BYPASS GRAFT  02/06/08   X 3  . SHOULDER ARTHROSCOPY WITH ROTATOR CUFF REPAIR AND SUBACROMIAL DECOMPRESSION Right 09/21/2018   Procedure: RIGHT SHOULDER ARTHROSCOPY WITH EXTENSIVE DEBRIDEMENT, BICEPS TENODESIS, SUBACROMIAL DECOMPRESSION,  ROTATOR CUFF REPAIR;  Surgeon: Leandrew Koyanagi, MD;  Location: [place];  Service:  Orthopedics;  Laterality: Right;  . SHOULDER ARTHROSCOPY WITH SUBACROMIAL DECOMPRESSION Left 03/27/2020   Procedure: LEFT SHOULDER ARTHROSCOPY WITH EXTENSIVE DEBRIDEMENT LABRUM AND BICEPS TENDON, SUBACROMIAL DECOMPRESSION, LYSIS OF ADHESIONS, MANIPULATION UNDER ANESTHESIA;  Surgeon: Leandrew Koyanagi, MD;  Location: [place];  Service: Orthopedics;  Laterality: Left;    There were no vitals filed for this visit.   Subjective Assessment - 05/08/20 0940    Subjective Petros reports poor HEP compliance the past few days due to being very sick.    Pertinent History CABG X 3.  OA C-spine and B shoulders    Limitations Lifting    Patient Stated Goals Return to normal L shoulder activities without pain (reaching, overhead function)    Pain Onset More than a month ago                             [facility] Adult PT Treatment/Exercise - 05/08/20 0001      Shoulder Exercises: Sidelying   External Rotation Left;15 reps      Shoulder Exercises: Pulleys   Flexion 3 minutes    Scaption 3 minutes      Shoulder Exercises: ROM/Strengthening   Lat Pull Other (comment)   machine 15 lbs 3 x 10 c 30 sec stretch between set     Manual Therapy   Manual  therapy comments supine g4 inferior jt mobs, ap jt mobs Rt GH jt.  MET contract/relax c compression to Lt lat for elevation, IR/ER in 70 deg abd.  Percussive device to Lt infraspinatus, Lt lat                       PT Long Term Goals - 05/03/20 1139      PT LONG TERM GOAL #1   Title Improve FOTO score to 66.    Baseline 52, was 34    Time 12    Period Weeks    Status On-going      PT LONG TERM GOAL #2   Title Improve L shoulder AROM to 90% of the uninvolved R.    Baseline Improving, see objective    Time 12    Period Weeks    Status On-going      PT LONG TERM GOAL #3   Title Improve L shoulder pain to consistently 0-3/10 on the Numeric Pain Rating Scale.    Baseline Can be 7+/10    Time 12    Period  Weeks    Status On-going      PT LONG TERM GOAL #4   Title Improve L shoulder strength as assessed by functional scores, subjective self-report and MMT.    Baseline Poor    Time 12    Period Weeks    Status On-going      PT LONG TERM GOAL #5   Title Jacob Gibbs will be independent with his long-term HEP at [place].    Time 12    Period Weeks    Status On-going                 Plan - 05/08/20 1002    Clinical Impression Statement Observed combination of capsular restriction as well as myofascial limitations (noted in cogwheel resistance for ER/IR, as well as lat restriction).  Extended manual therapy time spent today for addressing both restriction areas.  Discussed injection benefits based off research recommendations in regards to adhesitive capsulitis (Pt. indicated MD had offered on today's visit but Pt. declined at this time).    Personal Factors and Comorbidities Comorbidity 1    Comorbidities Severe shoulder OA, previous CABG X 3 and cervical OA.    Examination-Activity Limitations Bathing;Dressing;Sleep;Hygiene/Grooming;Bed Mobility;Lift;Reach Overhead;Carry    Examination-Participation Restrictions Occupation;Community Activity    Stability/Clinical Decision Making Stable/Uncomplicated    Rehab Potential Good    PT Frequency 2x / week    PT Duration 6 weeks    PT Treatment/Interventions ADLs/Self Care Home Management;Moist Heat;Cryotherapy;Therapeutic activities;Therapeutic exercise;Neuromuscular re-education;Patient/family education;Manual techniques;Passive range of motion;Vasopneumatic Device;Joint Manipulations    PT Next Visit Plan Incorporate myofascial restriction limitation intervention ( MET manual) as well as longer duration capsular and myofascial stretching    PT Home Exercise Plan Access Code: [email or web address]    Consulted and Agree with Plan of Care Patient           Patient will benefit from skilled therapeutic intervention in order to improve the following deficits and  impairments:  Decreased activity tolerance,Decreased endurance,Decreased range of motion,Decreased mobility,Decreased strength,Hypomobility,Increased edema,Impaired flexibility,Impaired UE functional use,Pain  Visit Diagnosis: Chronic left shoulder pain  Stiffness of left shoulder, not elsewhere classified  Muscle weakness (generalized)  Localized edema  Abnormal posture     Problem List Patient Active Problem List   Diagnosis Date Noted  . Adhesive capsulitis of left shoulder 03/27/2020  . Loose body in shoulder joint, left 03/27/2020  .  Impingement syndrome of left shoulder 03/27/2020  . 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 Coronary artery disease   . PAF (paroxysmal atrial fibrillation) ([place])   . Acute ST elevation myocardial infarction (STEMI) of inferior wall ([place]) 02/24/2016  . HLD (hyperlipidemia) 08/19/2010    Scot Jun, PT, DPT, OCS, ATC 05/08/20  10:09 AM    [facility] Physical Therapy [address] [place], [place], [address] Phone: [phone]   Fax:  [phone]  Name: Jacob Gibbs MRN: [id number] Date of Birth: January 06, 1961

## 2020-05-08 NOTE — Progress Notes (Signed)
Post-Op Visit Note   Patient: Jacob Gibbs           Date of Birth: March 11, 1961           MRN: [id number] Visit Date: 05/08/2020 PCP: Martha Clan, MD   Assessment & Plan:  Chief Complaint:  Chief Complaint  Patient presents with  . Left Shoulder - Pain, Follow-up   Visit Diagnoses:  1. Adhesive capsulitis of left shoulder   2. S/P arthroscopy of left shoulder     Plan:   [name] is approximately 6 weeks status post shoulder scope and manipulation for adhesive capsulitis.  Overall he is improving in terms of pain and range of motion.  He is doing home exercises.  He had to miss a few sessions of physical therapy.  Range of motion of the left shoulder shows forward flexion to 110 degrees, external rotation 15 degrees, internal rotation to the buttock.  Strength is intact.  [name] is making steady progress from my standpoint.  We discussed the possibility of intra-articular cortisone injection to help facilitate range of motion but he would like to hold off on this until next visit.  He will continue with PT at home exercise in the meantime.  [name] ointment was recommended as well as CBD oil and cream.  Recheck in 6 weeks.  Follow-Up Instructions: Return in about 6 weeks (around 06/19/2020).   Orders:  No orders of the defined types were placed in this encounter.  Meds ordered this encounter  Medications  . Diclofenac Sodium ([name]) 2 % SOLN    Sig: Apply 2 g topically 2 (two) times daily as needed (to affected area).    Dispense:  112 g    Refill:  3    Imaging: No results found.  PMFS History: Patient Active Problem List   Diagnosis Date Noted  . Adhesive capsulitis of left shoulder 03/27/2020  . Loose body in shoulder joint, left 03/27/2020  . Impingement syndrome of left shoulder 03/27/2020  . 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 Coronary artery disease   . PAF (paroxysmal atrial fibrillation) (HCC)   . Acute ST elevation  myocardial infarction (STEMI) of inferior wall (HCC) 02/24/2016  . HLD (hyperlipidemia) 08/19/2010   Past Medical History:  Diagnosis Date  . Anxiety   . Coronary artery disease    a. 2009: mutivessel CAD s/p CABG   b. 01/2016: inf STEMI s/p DES to RCA  . DDD (degenerative disc disease), cervical   . History of tobacco use    REMOTE  . Hyperlipidemia   . PAF (paroxysmal atrial fibrillation) (HCC)    a. brief episode in dec 2005 wtih no recurrence. No indication for [place]  . Sleep apnea    uses cpap  . Syncope and collapse     Family History  Problem Relation Age of Onset  . Coronary artery disease Mother        ALSO ONE OF HIS UNCLES    Past Surgical History:  Procedure Laterality Date  . CARDIAC CATHETERIZATION N/A 02/24/2016   Procedure: Coronary Stent Intervention;  Surgeon: Tonny Bollman, MD;  Location: [facility] INVASIVE CV LAB;  Service: Cardiovascular;  Laterality: N/A;  . CARDIAC CATHETERIZATION N/A 02/24/2016   Procedure: Left Heart Cath and Cors/Grafts Angiography;  Surgeon: Tonny Bollman, MD;  Location: [facility] INVASIVE CV LAB;  Service: Cardiovascular;  Laterality: N/A;  . CORONARY ARTERY BYPASS GRAFT  02/06/08   X 3  . SHOULDER ARTHROSCOPY WITH ROTATOR CUFF  REPAIR AND SUBACROMIAL DECOMPRESSION Right 09/21/2018   Procedure: RIGHT SHOULDER ARTHROSCOPY WITH EXTENSIVE DEBRIDEMENT, BICEPS TENODESIS, SUBACROMIAL DECOMPRESSION,  ROTATOR CUFF REPAIR;  Surgeon: Tarry Kos, MD;  Location: [facility];  Service: Orthopedics;  Laterality: Right;  . SHOULDER ARTHROSCOPY WITH SUBACROMIAL DECOMPRESSION Left 03/27/2020   Procedure: LEFT SHOULDER ARTHROSCOPY WITH EXTENSIVE DEBRIDEMENT LABRUM AND BICEPS TENDON, SUBACROMIAL DECOMPRESSION, LYSIS OF ADHESIONS, MANIPULATION UNDER ANESTHESIA;  Surgeon: Tarry Kos, MD;  Location: [facility];  Service: Orthopedics;  Laterality: Left;   Social History   Occupational History  . Occupation: Firefighter  Tobacco Use   . Smoking status: Former Smoker    Packs/day: 0.50    Years: 20.00    Pack years: 10.00    Types: Cigarettes    Quit date: 01/07/2004    Years since quitting: 16.3  . Smokeless tobacco: Never Used  . Tobacco comment: Stopped 35yrs ago  Substance and Sexual Activity  . Alcohol use: Yes    Alcohol/week: 3.0 standard drinks    Types: 3 Standard drinks or equivalent per week    Comment: OCCASIONAL  . Drug use: No  . Sexual activity: Not on file

## 2020-05-10 ENCOUNTER — Ambulatory Visit (INDEPENDENT_AMBULATORY_CARE_PROVIDER_SITE_OTHER): Payer: BLUE CROSS/BLUE SHIELD | Admitting: Rehabilitative and Restorative Service Providers"

## 2020-05-10 ENCOUNTER — Encounter: Payer: Self-pay | Admitting: Rehabilitative and Restorative Service Providers"

## 2020-05-10 ENCOUNTER — Other Ambulatory Visit: Payer: Self-pay

## 2020-05-10 DIAGNOSIS — M25612 Stiffness of left shoulder, not elsewhere classified: Secondary | ICD-10-CM

## 2020-05-10 DIAGNOSIS — R600 Localized edema: Secondary | ICD-10-CM

## 2020-05-10 DIAGNOSIS — G8929 Other chronic pain: Secondary | ICD-10-CM

## 2020-05-10 DIAGNOSIS — M25512 Pain in left shoulder: Secondary | ICD-10-CM

## 2020-05-10 DIAGNOSIS — M6281 Muscle weakness (generalized): Secondary | ICD-10-CM

## 2020-05-10 NOTE — Therapy (Signed)
[place], [place], [address] Phone: [phone]   Fax:  [phone]  Physical Therapy Treatment  Patient Details  Name: Jacob Gibbs MRN: [id number] Date of Birth: [birth year] Referring Provider (PT): Leandrew Koyanagi MD   Encounter Date: 05/10/2020   PT End of Session - 05/10/20 1801    Visit Number 11    Number of Visits 36    Date for PT Re-Evaluation 06/27/20    Progress Note Due on Visit 19    PT Start Time 0801    PT Stop Time 0845    PT Time Calculation (min) 44 min    Activity Tolerance Patient tolerated treatment well    Behavior During Therapy [facility] for tasks assessed/performed           Past Medical History:  Diagnosis Date  . Anxiety   . Coronary artery disease    a. 2009: mutivessel CAD s/p CABG   b. 01/2016: inf STEMI s/p DES to RCA  . DDD (degenerative disc disease), cervical   . History of tobacco use    REMOTE  . Hyperlipidemia   . PAF (paroxysmal atrial fibrillation) (HCC)    a. brief episode in dec 2005 wtih no recurrence. No indication for [place]  . Sleep apnea    uses cpap  . Syncope and collapse     Past Surgical History:  Procedure Laterality Date  . CARDIAC CATHETERIZATION N/A 02/24/2016   Procedure: Coronary Stent Intervention;  Surgeon: Sherren Mocha, MD;  Location: [place] [facility];  Service: Cardiovascular;  Laterality: N/A;  . CARDIAC CATHETERIZATION N/A 02/24/2016   Procedure: Left Heart Cath and Cors/Grafts Angiography;  Surgeon: Sherren Mocha, MD;  Location: [place] [facility];  Service: Cardiovascular;  Laterality: N/A;  . CORONARY ARTERY BYPASS GRAFT  02/06/08   X 3  . SHOULDER ARTHROSCOPY WITH ROTATOR CUFF REPAIR AND SUBACROMIAL DECOMPRESSION Right 09/21/2018   Procedure: RIGHT SHOULDER ARTHROSCOPY WITH EXTENSIVE DEBRIDEMENT, BICEPS TENODESIS, SUBACROMIAL DECOMPRESSION,  ROTATOR CUFF REPAIR;  Surgeon: Leandrew Koyanagi, MD;  Location: [place];  Service:  Orthopedics;  Laterality: Right;  . SHOULDER ARTHROSCOPY WITH SUBACROMIAL DECOMPRESSION Left 03/27/2020   Procedure: LEFT SHOULDER ARTHROSCOPY WITH EXTENSIVE DEBRIDEMENT LABRUM AND BICEPS TENDON, SUBACROMIAL DECOMPRESSION, LYSIS OF ADHESIONS, MANIPULATION UNDER ANESTHESIA;  Surgeon: Leandrew Koyanagi, MD;  Location: [place];  Service: Orthopedics;  Laterality: Left;    There were no vitals filed for this visit.   Subjective Assessment - 05/10/20 1758    Subjective Jacob Gibbs reports better HEP compliance.  Sleep is still affected by L shoulder pain.    Pertinent History CABG X 3.  OA C-spine and B shoulders    Limitations Lifting    Patient Stated Goals Return to normal L shoulder activities without pain (reaching, overhead function)    Currently in Pain? Yes    Pain Score 5     Pain Location Shoulder    Pain Orientation Left    Pain Descriptors / Indicators Aching;Sore;Tightness    Pain Type Chronic pain;Surgical pain    Pain Onset More than a month ago    Pain Frequency Intermittent    Aggravating Factors  L shoulder use    Pain Relieving Factors Ice and tylenol    Effect of Pain on Daily Activities Limits sleep and L UE function    Multiple Pain Sites No              [facility] PT Assessment -  05/10/20 0001      AROM   Left Shoulder Flexion 130 Degrees    Left Shoulder Internal Rotation 35 Degrees    Left Shoulder External Rotation 45 Degrees    Left Shoulder Horizontal ADduction 40 Degrees                         [facility] Adult PT Treatment/Exercise - 05/10/20 0001      Exercises   Exercises Shoulder (P)       Shoulder Exercises: Supine   External Rotation AROM;Left;10 reps (P)     External Rotation Limitations 10 seconds (P)     Internal Rotation AROM;Left;10 reps;Other (comment) (P)     Internal Rotation Limitations 10 seconds (P)     Flexion AAROM;Left;10 reps;Other (comment) (P)     Flexion Limitations 10 seconds (P)       Shoulder Exercises:  Sidelying   Internal Rotation AROM;Left;10 reps;Other (comment) (P)     Internal Rotation Limitations 10 seconds (P)       Shoulder Exercises: Standing   Retraction Strengthening;Both;10 reps (P)     Retraction Limitations 5 seconds (shoulder blade pinches) (P)       Shoulder Exercises: Pulleys   Flexion 2 minutes (P)     Flexion Limitations Protraction first with palm facing in (P)     Scaption 2 minutes (P)     Scaption Limitations Protraction first with thumb up (P)       Manual Therapy   Manual Therapy Passive ROM (P)     Manual therapy comments IR/ER (P)                   PT Education - 05/10/20 1800    Education Details Reviewed HEP    Person(s) Educated Patient    Methods Explanation;Demonstration;Tactile cues;Verbal cues    Comprehension Verbalized understanding;Need further instruction;Returned demonstration;Verbal cues required               PT Long Term Goals - 05/10/20 1800      PT LONG TERM GOAL #1   Title Improve FOTO score to 66.    Baseline 52, was 34    Time 12    Period Weeks    Status On-going      PT LONG TERM GOAL #2   Title Improve L shoulder AROM to 90% of the uninvolved R.    Baseline Improving, see objective    Time 12    Period Weeks    Status On-going      PT LONG TERM GOAL #3   Title Improve L shoulder pain to consistently 0-3/10 on the Numeric Pain Rating Scale.    Baseline Can be 7+/10    Time 12    Period Weeks    Status On-going      PT LONG TERM GOAL #4   Title Improve L shoulder strength as assessed by functional scores, subjective self-report and MMT.    Baseline Poor    Time 12    Period Weeks    Status On-going      PT LONG TERM GOAL #5   Title Paz will be independent with his long-term HEP at [place].    Time 12    Period Weeks    Status On-going                 Plan - 05/10/20 1802    Clinical Impression Statement AROM remains priority #1 although progress is  noted week to week, particularly now  that Jacob Gibbs is back on his normal HEP schedule.  Sleeping is still affected and is expected to improve as AROM improves.  His current home and clinic programs are addressing this.    Personal Factors and Comorbidities Comorbidity 1    Comorbidities Severe shoulder OA, previous CABG X 3 and cervical OA.    Examination-Activity Limitations Bathing;Dressing;Sleep;Hygiene/Grooming;Bed Mobility;Lift;Reach Overhead;Carry    Examination-Participation Restrictions Occupation;Community Activity    Stability/Clinical Decision Making Stable/Uncomplicated    Rehab Potential Good    PT Frequency 2x / week    PT Duration 6 weeks    PT Treatment/Interventions ADLs/Self Care Home Management;Moist Heat;Cryotherapy;Therapeutic activities;Therapeutic exercise;Neuromuscular re-education;Patient/family education;Manual techniques;Passive range of motion;Vasopneumatic Device;Joint Manipulations    PT Next Visit Plan Incorporate myofascial restriction limitation intervention ( MET manual) as well as longer duration capsular and myofascial stretching    PT Home Exercise Plan Access Code: [email or web address]    Consulted and Agree with Plan of Care Patient           Patient will benefit from skilled therapeutic intervention in order to improve the following deficits and impairments:  Decreased activity tolerance,Decreased endurance,Decreased range of motion,Decreased mobility,Decreased strength,Hypomobility,Increased edema,Impaired flexibility,Impaired UE functional use,Pain  Visit Diagnosis: Chronic left shoulder pain  Stiffness of left shoulder, not elsewhere classified  Muscle weakness (generalized)  Localized edema     Problem List Patient Active Problem List   Diagnosis Date Noted  . Adhesive capsulitis of left shoulder 03/27/2020  . Loose body in shoulder joint, left 03/27/2020  . Impingement syndrome of left shoulder 03/27/2020  . 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 Coronary  artery disease   . PAF (paroxysmal atrial fibrillation) ([place])   . Acute ST elevation myocardial infarction (STEMI) of inferior wall ([place]) 02/24/2016  . HLD (hyperlipidemia) 08/19/2010    Farley Ly PT, MPT 05/10/2020, 6:04 PM  [facility] Physical Therapy [address] [place], [place], [address] Phone: [phone]   Fax:  [phone]  Name: Jacob Gibbs MRN: [id number] Date of Birth: 08-02-1960

## 2020-05-13 ENCOUNTER — Other Ambulatory Visit: Payer: Self-pay

## 2020-05-13 ENCOUNTER — Encounter: Payer: Self-pay | Admitting: Rehabilitative and Restorative Service Providers"

## 2020-05-13 ENCOUNTER — Ambulatory Visit (INDEPENDENT_AMBULATORY_CARE_PROVIDER_SITE_OTHER): Payer: BLUE CROSS/BLUE SHIELD | Admitting: Rehabilitative and Restorative Service Providers"

## 2020-05-13 DIAGNOSIS — M6281 Muscle weakness (generalized): Secondary | ICD-10-CM | POA: Diagnosis not present

## 2020-05-13 DIAGNOSIS — R293 Abnormal posture: Secondary | ICD-10-CM

## 2020-05-13 DIAGNOSIS — G8929 Other chronic pain: Secondary | ICD-10-CM | POA: Diagnosis not present

## 2020-05-13 DIAGNOSIS — M25612 Stiffness of left shoulder, not elsewhere classified: Secondary | ICD-10-CM | POA: Diagnosis not present

## 2020-05-13 DIAGNOSIS — R600 Localized edema: Secondary | ICD-10-CM | POA: Diagnosis not present

## 2020-05-13 DIAGNOSIS — M25512 Pain in left shoulder: Secondary | ICD-10-CM | POA: Diagnosis not present

## 2020-05-13 NOTE — Patient Instructions (Signed)
Access Code: [email or web address] URL: [email or web address] Date: 05/13/2020 Prepared by: Chyrel Masson  Exercises Supine Scapular Protraction in Flexion with Dumbbells - 2-5 x daily - 7 x weekly - 1 sets - 20 reps - 3 seconds hold Supine Shoulder Internal Rotation Stretch - 2-3 x daily - 7 x weekly - 1 sets - 10-20 reps - 10 secondsinutes hold Supine Shoulder External Rotation Stretch - 2-3 x daily - 7 x weekly - 1 sets - 10-20 reps - 10 seconds hold Sidelying Shoulder Abduction Palm Forward - 2 x daily - 7 x weekly - 3 sets - 10 reps Sidelying Shoulder Flexion 15 Degrees - 2 x daily - 7 x weekly - 3 sets - 10 reps Sidelying Shoulder External Rotation - 2 x daily - 7 x weekly - 3 sets - 10 reps Standing shoulder flexion wall slides - 2 x daily - 7 x weekly - 1 sets - 10-15 reps - 5 hold

## 2020-05-13 NOTE — Therapy (Signed)
[place], [place], [address] Phone: [phone]   Fax:  [phone]  Physical Therapy Treatment  Patient Details  Name: Jacob Gibbs MRN: [id number] Date of Birth: 27-Aug-1960 Referring Provider (PT): Leandrew Koyanagi MD   Encounter Date: 05/13/2020   PT End of Session - 05/13/20 0835    Visit Number 12    Number of Visits 36    Date for PT Re-Evaluation 06/27/20    Progress Note Due on Visit 70    PT Start Time 0803    PT Stop Time 0843    PT Time Calculation (min) 40 min    Activity Tolerance Patient tolerated treatment well    Behavior During Therapy [facility] for tasks assessed/performed           Past Medical History:  Diagnosis Date  . Anxiety   . Coronary artery disease    a. 2009: mutivessel CAD s/p CABG   b. 01/2016: inf STEMI s/p DES to RCA  . DDD (degenerative disc disease), cervical   . History of tobacco use    REMOTE  . Hyperlipidemia   . PAF (paroxysmal atrial fibrillation) (HCC)    a. brief episode in dec 2005 wtih no recurrence. No indication for [place]  . Sleep apnea    uses cpap  . Syncope and collapse     Past Surgical History:  Procedure Laterality Date  . CARDIAC CATHETERIZATION N/A 02/24/2016   Procedure: Coronary Stent Intervention;  Surgeon: Sherren Mocha, MD;  Location: [place] [facility];  Service: Cardiovascular;  Laterality: N/A;  . CARDIAC CATHETERIZATION N/A 02/24/2016   Procedure: Left Heart Cath and Cors/Grafts Angiography;  Surgeon: Sherren Mocha, MD;  Location: [place] [facility];  Service: Cardiovascular;  Laterality: N/A;  . CORONARY ARTERY BYPASS GRAFT  02/06/08   X 3  . SHOULDER ARTHROSCOPY WITH ROTATOR CUFF REPAIR AND SUBACROMIAL DECOMPRESSION Right 09/21/2018   Procedure: RIGHT SHOULDER ARTHROSCOPY WITH EXTENSIVE DEBRIDEMENT, BICEPS TENODESIS, SUBACROMIAL DECOMPRESSION,  ROTATOR CUFF REPAIR;  Surgeon: Leandrew Koyanagi, MD;  Location: [place];  Service:  Orthopedics;  Laterality: Right;  . SHOULDER ARTHROSCOPY WITH SUBACROMIAL DECOMPRESSION Left 03/27/2020   Procedure: LEFT SHOULDER ARTHROSCOPY WITH EXTENSIVE DEBRIDEMENT LABRUM AND BICEPS TENDON, SUBACROMIAL DECOMPRESSION, LYSIS OF ADHESIONS, MANIPULATION UNDER ANESTHESIA;  Surgeon: Leandrew Koyanagi, MD;  Location: [place];  Service: Orthopedics;  Laterality: Left;    There were no vitals filed for this visit.   Subjective Assessment - 05/13/20 0809    Subjective Pt. indicated still having trouble c getting into bed.  Pt. stated getting CBD roll on and thought it was maybe helpful.    Pertinent History CABG X 3.  OA C-spine and B shoulders    Limitations Lifting    Patient Stated Goals Return to normal L shoulder activities without pain (reaching, overhead function)    Currently in Pain? Yes    Pain Score 3    mild pain   Pain Location Shoulder    Pain Orientation Left    Pain Descriptors / Indicators Aching;Sore;Tightness    Pain Type Chronic pain;Surgical pain    Pain Onset More than a month ago              [facility] PT Assessment - 05/13/20 0001      ROM / Strength   AROM / PROM / Strength Strength      AROM   Left Shoulder Flexion 130 Degrees   supine  Left Shoulder ABduction 82 Degrees   supine     Strength   Strength Assessment Site Shoulder    Right/Left Shoulder Left;Right    Left Shoulder Flexion 4-/5    Left Shoulder ABduction 3+/5    Left Shoulder Internal Rotation 4+/5    Left Shoulder External Rotation 4/5                         [facility] Adult PT Treatment/Exercise - 05/13/20 0001      Shoulder Exercises: Sidelying   Flexion Left   2 x 10   ABduction Left   2 x 10     Manual Therapy   Manual therapy comments g3-g4 inferior jt mobs, ap mobs, MET ER/IR contract/relax, active compression to Lt lat c elevation stretching                  PT Education - 05/13/20 0828    Education Details Provided sidelying  AROM/strenthening for strength deficits    Person(s) Educated Patient    Methods Explanation;Demonstration;Verbal cues;Handout    Comprehension Verbalized understanding;Returned demonstration               PT Long Term Goals - 05/10/20 1800      PT LONG TERM GOAL #1   Title Improve FOTO score to 66.    Baseline 52, was 34    Time 12    Period Weeks    Status On-going      PT LONG TERM GOAL #2   Title Improve L shoulder AROM to 90% of the uninvolved R.    Baseline Improving, see objective    Time 12    Period Weeks    Status On-going      PT LONG TERM GOAL #3   Title Improve L shoulder pain to consistently 0-3/10 on the Numeric Pain Rating Scale.    Baseline Can be 7+/10    Time 12    Period Weeks    Status On-going      PT LONG TERM GOAL #4   Title Improve L shoulder strength as assessed by functional scores, subjective self-report and MMT.    Baseline Poor    Time 12    Period Weeks    Status On-going      PT LONG TERM GOAL #5   Title Jacob Gibbs will be independent with his long-term HEP at DC.    Time 12    Period Weeks    Status On-going                 Plan - 05/13/20 7741    Clinical Impression Statement Additional measurements today revealed noted elevation strength deficits in flexion, abduction as well marked impairment for active abduction movement.  Adjusted HEP to address each area in addition to existing ER/IR   Pt. to benefit from passive/active ROM gains AND strength improvements at this time.    Personal Factors and Comorbidities Comorbidity 1    Comorbidities Severe shoulder OA, previous CABG X 3 and cervical OA.    Examination-Activity Limitations Bathing;Dressing;Sleep;Hygiene/Grooming;Bed Mobility;Lift;Reach Overhead;Carry    Examination-Participation Restrictions Occupation;Community Activity    Stability/Clinical Decision Making Stable/Uncomplicated    Rehab Potential Good    PT Frequency 2x / week    PT Duration 6 weeks    PT  Treatment/Interventions ADLs/Self Care Home Management;Moist Heat;Cryotherapy;Therapeutic activities;Therapeutic exercise;Neuromuscular re-education;Patient/family education;Manual techniques;Passive range of motion;Vasopneumatic Device;Joint Manipulations    PT Next Visit Plan Incorporate myofascial restriction  limitation intervention ( MET manual), continue gravity reduced elevation strengthening in addition to end range mobility interventions.    PT Home Exercise Plan Access Code: [email or web address]    Consulted and Agree with Plan of Care Patient           Patient will benefit from skilled therapeutic intervention in order to improve the following deficits and impairments:  Decreased activity tolerance,Decreased endurance,Decreased range of motion,Decreased mobility,Decreased strength,Hypomobility,Increased edema,Impaired flexibility,Impaired UE functional use,Pain  Visit Diagnosis: Chronic left shoulder pain  Stiffness of left shoulder, not elsewhere classified  Muscle weakness (generalized)  Localized edema  Abnormal posture     Problem List Patient Active Problem List   Diagnosis Date Noted  . Adhesive capsulitis of left shoulder 03/27/2020  . Loose body in shoulder joint, left 03/27/2020  . Impingement syndrome of left shoulder 03/27/2020  . 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 Coronary artery disease   . PAF (paroxysmal atrial fibrillation) ([place])   . Acute ST elevation myocardial infarction (STEMI) of inferior wall ([place]) 02/24/2016  . HLD (hyperlipidemia) 08/19/2010    Scot Jun, PT, DPT, OCS, ATC 05/13/20  8:38 AM    [facility] Physical Therapy [address] [place], [place], [address] Phone: [phone]   Fax:  [phone]  Name: TERUO STILLEY MRN: [id number] Date of Birth: June 14, 1960

## 2020-05-15 ENCOUNTER — Encounter: Payer: Self-pay | Admitting: Rehabilitative and Restorative Service Providers"

## 2020-05-15 ENCOUNTER — Other Ambulatory Visit: Payer: Self-pay

## 2020-05-15 ENCOUNTER — Ambulatory Visit (INDEPENDENT_AMBULATORY_CARE_PROVIDER_SITE_OTHER): Payer: BLUE CROSS/BLUE SHIELD | Admitting: Rehabilitative and Restorative Service Providers"

## 2020-05-15 DIAGNOSIS — M6281 Muscle weakness (generalized): Secondary | ICD-10-CM | POA: Diagnosis not present

## 2020-05-15 DIAGNOSIS — R600 Localized edema: Secondary | ICD-10-CM

## 2020-05-15 DIAGNOSIS — M25612 Stiffness of left shoulder, not elsewhere classified: Secondary | ICD-10-CM | POA: Diagnosis not present

## 2020-05-15 DIAGNOSIS — M25512 Pain in left shoulder: Secondary | ICD-10-CM | POA: Diagnosis not present

## 2020-05-15 DIAGNOSIS — G8929 Other chronic pain: Secondary | ICD-10-CM | POA: Diagnosis not present

## 2020-05-15 DIAGNOSIS — R293 Abnormal posture: Secondary | ICD-10-CM

## 2020-05-15 NOTE — Therapy (Signed)
[place], [place], [address] Phone: [phone]   Fax:  [phone]  Physical Therapy Treatment  Patient Details  Name: Jacob Gibbs MRN: [id number] Date of Birth: 1960/08/12 Referring Provider (PT): Leandrew Koyanagi MD   Encounter Date: 05/15/2020   PT End of Session - 05/15/20 0823    Visit Number 13    Number of Visits 36    Date for PT Re-Evaluation 06/27/20    Progress Note Due on Visit 19    PT Start Time 0801    PT Stop Time 0841    PT Time Calculation (min) 40 min    Activity Tolerance Patient tolerated treatment well    Behavior During Therapy [facility] for tasks assessed/performed           Past Medical History:  Diagnosis Date  . Anxiety   . Coronary artery disease    a. 2009: mutivessel CAD s/p CABG   b. 01/2016: inf STEMI s/p DES to RCA  . DDD (degenerative disc disease), cervical   . History of tobacco use    REMOTE  . Hyperlipidemia   . PAF (paroxysmal atrial fibrillation) (HCC)    a. brief episode in dec 2005 wtih no recurrence. No indication for [place]  . Sleep apnea    uses cpap  . Syncope and collapse     Past Surgical History:  Procedure Laterality Date  . CARDIAC CATHETERIZATION N/A 02/24/2016   Procedure: Coronary Stent Intervention;  Surgeon: Sherren Mocha, MD;  Location: [place] [facility];  Service: Cardiovascular;  Laterality: N/A;  . CARDIAC CATHETERIZATION N/A 02/24/2016   Procedure: Left Heart Cath and Cors/Grafts Angiography;  Surgeon: Sherren Mocha, MD;  Location: [place] [facility];  Service: Cardiovascular;  Laterality: N/A;  . CORONARY ARTERY BYPASS GRAFT  02/06/08   X 3  . SHOULDER ARTHROSCOPY WITH ROTATOR CUFF REPAIR AND SUBACROMIAL DECOMPRESSION Right 09/21/2018   Procedure: RIGHT SHOULDER ARTHROSCOPY WITH EXTENSIVE DEBRIDEMENT, BICEPS TENODESIS, SUBACROMIAL DECOMPRESSION,  ROTATOR CUFF REPAIR;  Surgeon: Leandrew Koyanagi, MD;  Location: [place];  Service:  Orthopedics;  Laterality: Right;  . SHOULDER ARTHROSCOPY WITH SUBACROMIAL DECOMPRESSION Left 03/27/2020   Procedure: LEFT SHOULDER ARTHROSCOPY WITH EXTENSIVE DEBRIDEMENT LABRUM AND BICEPS TENDON, SUBACROMIAL DECOMPRESSION, LYSIS OF ADHESIONS, MANIPULATION UNDER ANESTHESIA;  Surgeon: Leandrew Koyanagi, MD;  Location: [place];  Service: Orthopedics;  Laterality: Left;    There were no vitals filed for this visit.   Subjective Assessment - 05/15/20 0822    Subjective Pt. reported yesterday and night, today was more sore and troublesome.    Pertinent History CABG X 3.  OA C-spine and B shoulders    Limitations Lifting    Patient Stated Goals Return to normal L shoulder activities without pain (reaching, overhead function)    Currently in Pain? Yes    Pain Score 6     Pain Location Shoulder    Pain Orientation Left    Pain Descriptors / Indicators Aching;Sore;Tightness    Pain Type Chronic pain;Surgical pain    Pain Onset More than a month ago    Pain Frequency Intermittent    Aggravating Factors  Maybe with lifting plates, end range movement, general insidious ache at times                             [facility] Adult PT Treatment/Exercise - 05/15/20 0001      Neuro Re-ed  Neuro Re-ed Details  Rhythmic stabilizations in 100 deg flexion Rt GH jt moderate resistance 30 sec x 2, ER/IR in 45 deg abd 30 sec x 2 moderate resistance      Shoulder Exercises: Supine   Horizontal ABduction Both;Theraband   3 x 10 green, focus on full movement to tightness restriction   Theraband Level (Shoulder Horizontal ABduction) Level 3 (Green)    External Rotation Other (comment)   HEP   Internal Rotation Other (comment)   HEP     Shoulder Exercises: Sidelying   Flexion Left;Other (comment)   2 x 15   ABduction Left;Other (comment)   2 x 15     Shoulder Exercises: Standing   Retraction Other (comment)   HEP   Other Standing Exercises UE ranger flexion c ipsilateral LE step  in 2 x 10   2-3 second hold stretch     Manual Therapy   Manual therapy comments g4 inferior jt mobs in flexion, scap, abd Lt Gh Jt.  MWM c ER AROM 2 x 10 in 75 deg abd.  MET contract/relax for IR in 60 deg abd                       PT Long Term Goals - 05/10/20 1800      PT LONG TERM GOAL #1   Title Improve FOTO score to 66.    Baseline 52, was 34    Time 12    Period Weeks    Status On-going      PT LONG TERM GOAL #2   Title Improve L shoulder AROM to 90% of the uninvolved R.    Baseline Improving, see objective    Time 12    Period Weeks    Status On-going      PT LONG TERM GOAL #3   Title Improve L shoulder pain to consistently 0-3/10 on the Numeric Pain Rating Scale.    Baseline Can be 7+/10    Time 12    Period Weeks    Status On-going      PT LONG TERM GOAL #4   Title Improve L shoulder strength as assessed by functional scores, subjective self-report and MMT.    Baseline Poor    Time 12    Period Weeks    Status On-going      PT LONG TERM GOAL #5   Title Saqib will be independent with his long-term HEP at DC.    Time 12    Period Weeks    Status On-going                 Plan - 05/15/20 6256    Clinical Impression Statement ER movement in clinic gained well c application of MWM c ER in supine.  Continued end range mobility impairments c symptoms noted in capsular pattern. Continued progression to include strnegthening program with AROM to improve function in available range.    Personal Factors and Comorbidities Comorbidity 1    Comorbidities Severe shoulder OA, previous CABG X 3 and cervical OA.    Examination-Activity Limitations Bathing;Dressing;Sleep;Hygiene/Grooming;Bed Mobility;Lift;Reach Overhead;Carry    Examination-Participation Restrictions Occupation;Community Activity    Stability/Clinical Decision Making Stable/Uncomplicated    Rehab Potential Good    PT Frequency 2x / week    PT Duration 6 weeks    PT  Treatment/Interventions ADLs/Self Care Home Management;Moist Heat;Cryotherapy;Therapeutic activities;Therapeutic exercise;Neuromuscular re-education;Patient/family education;Manual techniques;Passive range of motion;Vasopneumatic Device;Joint Manipulations    PT Next Visit Plan Continue  MWM c ER, contract/relax techniques for IR/ER.  Progress strengthening program in available range avoiding shrug.  Transitioned early arom exercises primarily to HEP to allow progresion while in clinic.    PT Home Exercise Plan Access Code: [email or web address]    Consulted and Agree with Plan of Care Patient           Patient will benefit from skilled therapeutic intervention in order to improve the following deficits and impairments:  Decreased activity tolerance,Decreased endurance,Decreased range of motion,Decreased mobility,Decreased strength,Hypomobility,Increased edema,Impaired flexibility,Impaired UE functional use,Pain  Visit Diagnosis: Chronic left shoulder pain  Stiffness of left shoulder, not elsewhere classified  Muscle weakness (generalized)  Localized edema  Abnormal posture     Problem List Patient Active Problem List   Diagnosis Date Noted  . Adhesive capsulitis of left shoulder 03/27/2020  . Loose body in shoulder joint, left 03/27/2020  . Impingement syndrome of left shoulder 03/27/2020  . 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 Coronary artery disease   . PAF (paroxysmal atrial fibrillation) ([place])   . Acute ST elevation myocardial infarction (STEMI) of inferior wall ([place]) 02/24/2016  . HLD (hyperlipidemia) 08/19/2010    Scot Jun, PT, DPT, OCS, ATC 05/15/20  8:37 AM    [facility] Physical Therapy [address] [place], [place], [address] Phone: [phone]   Fax:  [phone]  Name: Jacob Gibbs MRN: [id number] Date of Birth: [birth year]

## 2020-05-20 ENCOUNTER — Ambulatory Visit (INDEPENDENT_AMBULATORY_CARE_PROVIDER_SITE_OTHER): Payer: BLUE CROSS/BLUE SHIELD | Admitting: Rehabilitative and Restorative Service Providers"

## 2020-05-20 ENCOUNTER — Other Ambulatory Visit: Payer: Self-pay

## 2020-05-20 ENCOUNTER — Encounter: Payer: Self-pay | Admitting: Rehabilitative and Restorative Service Providers"

## 2020-05-20 DIAGNOSIS — M6281 Muscle weakness (generalized): Secondary | ICD-10-CM

## 2020-05-20 DIAGNOSIS — G8929 Other chronic pain: Secondary | ICD-10-CM | POA: Diagnosis not present

## 2020-05-20 DIAGNOSIS — R293 Abnormal posture: Secondary | ICD-10-CM | POA: Diagnosis not present

## 2020-05-20 DIAGNOSIS — M25512 Pain in left shoulder: Secondary | ICD-10-CM

## 2020-05-20 DIAGNOSIS — M25612 Stiffness of left shoulder, not elsewhere classified: Secondary | ICD-10-CM

## 2020-05-20 DIAGNOSIS — R600 Localized edema: Secondary | ICD-10-CM

## 2020-05-20 NOTE — Therapy (Signed)
[facility] Physical Therapy [address] [place], [place], [address] Phone: [phone]   Fax:  [phone]  Physical Therapy Treatment  Patient Details  Name: Jacob Gibbs MRN: [id number] Date of Birth: October 21, 1960 Referring Provider (PT): Tarry Kos MD   Encounter Date: 05/20/2020   PT End of Session - 05/20/20 0817    Visit Number 14    Number of Visits 36    Date for PT Re-Evaluation 06/27/20    Progress Note Due on Visit 19    PT Start Time 0800    PT Stop Time 0840    PT Time Calculation (min) 40 min    Activity Tolerance Patient tolerated treatment well    Behavior During Therapy [facility] for tasks assessed/performed           Past Medical History:  Diagnosis Date  . Anxiety   . Coronary artery disease    a. 2009: mutivessel CAD s/p CABG   b. 01/2016: inf STEMI s/p DES to RCA  . DDD (degenerative disc disease), cervical   . History of tobacco use    REMOTE  . Hyperlipidemia   . PAF (paroxysmal atrial fibrillation) (HCC)    a. brief episode in dec 2005 wtih no recurrence. No indication for OAC  . Sleep apnea    uses cpap  . Syncope and collapse     Past Surgical History:  Procedure Laterality Date  . CARDIAC CATHETERIZATION N/A 02/24/2016   Procedure: Coronary Stent Intervention;  Surgeon: Tonny Bollman, MD;  Location: [facility] IN[facility];  Service: Cardiovascular;  Laterality: N/A;  . CARDIAC CATHETERIZATION N/A 02/24/2016   Procedure: Left Heart Cath and Cors/Grafts Angiography;  Surgeon: Tonny Bollman, MD;  Location: [facility] INV[facility];  Service: Cardiovascular;  Laterality: N/A;  . CORONARY ARTERY BYPASS GRAFT  02/06/08   X 3  . SHOULDER ARTHROSCOPY WITH ROTATOR CUFF REPAIR AND SUBACROMIAL DECOMPRESSION Right 09/21/2018   Procedure: RIGHT SHOULDER ARTHROSCOPY WITH EXTENSIVE DEBRIDEMENT, BICEPS TENODESIS, SUBACROMIAL DECOMPRESSION,  ROTATOR CUFF REPAIR;  Surgeon: Tarry Kos, MD;  Location: [facility];  Service:  Orthopedics;  Laterality: Right;  . SHOULDER ARTHROSCOPY WITH SUBACROMIAL DECOMPRESSION Left 03/27/2020   Procedure: LEFT SHOULDER ARTHROSCOPY WITH EXTENSIVE DEBRIDEMENT LABRUM AND BICEPS TENDON, SUBACROMIAL DECOMPRESSION, LYSIS OF ADHESIONS, MANIPULATION UNDER ANESTHESIA;  Surgeon: Tarry Kos, MD;  Location: [facility];  Service: Orthopedics;  Laterality: Left;    There were no vitals filed for this visit.   Subjective Assessment - 05/20/20 0810    Subjective Pt. stated topical cream seemed to be helpful.  Pt. denied any specific pain reportings from weekend or upon arrival today.    Pertinent History CABG X 3.  OA C-spine and B shoulders    Limitations Lifting    Patient Stated Goals Return to normal L shoulder activities without pain (reaching, overhead function)    Currently in Pain? No/denies    Pain Score 0-No pain    Pain Location Shoulder    Pain Onset More than a month ago                             [facility] Adult PT Treatment/Exercise - 05/20/20 0001      Shoulder Exercises: Supine   Other Supine Exercises supine green band d2 ext 2 x 15, horizontal abduction 2 x 15      Shoulder Exercises: Standing   External Rotation Strengthening;Left   3 x  15 green band   Internal Rotation Left   3 x 15 green band   Other Standing Exercises Wall flexion 5 sec hold slides x 10      Shoulder Exercises: ROM/Strengthening   UBE (Upper Arm Bike) Lvl 4 4 min fwd/back each way      Shoulder Exercises: Stretch   Other Shoulder Stretches doorway ER strech 70 deg abduction 30 sec x 3      Manual Therapy   Manual therapy comments g4 inferior jt mobs Lt Gh jt, ap mobs c ER MWM in 75 deg abd                       PT Long Term Goals - 05/10/20 1800      PT LONG TERM GOAL #1   Title Improve FOTO score to 66.    Baseline 52, was 34    Time 12    Period Weeks    Status On-going      PT LONG TERM GOAL #2   Title Improve L shoulder AROM to  90% of the uninvolved R.    Baseline Improving, see objective    Time 12    Period Weeks    Status On-going      PT LONG TERM GOAL #3   Title Improve L shoulder pain to consistently 0-3/10 on the Numeric Pain Rating Scale.    Baseline Can be 7+/10    Time 12    Period Weeks    Status On-going      PT LONG TERM GOAL #4   Title Improve L shoulder strength as assessed by functional scores, subjective self-report and MMT.    Baseline Poor    Time 12    Period Weeks    Status On-going      PT LONG TERM GOAL #5   Title Prem will be independent with his long-term HEP at DC.    Time 12    Period Weeks    Status On-going                 Plan - 05/20/20 6387    Clinical Impression Statement Pt. arrived c maintaining movement noted greater this visit than any visit previous.  Active movement against gravity is improving in quality c similar end range limitation as supine gravity reduced movements, showing strength progression.  Pt. has also reported a good week for reduced symptoms overall c tightness at end range main noted complaint.  Continued progression in end range movements.    Personal Factors and Comorbidities Comorbidity 1    Comorbidities Severe shoulder OA, previous CABG X 3 and cervical OA.    Examination-Activity Limitations Bathing;Dressing;Sleep;Hygiene/Grooming;Bed Mobility;Lift;Reach Overhead;Carry    Examination-Participation Restrictions Occupation;Community Activity    Stability/Clinical Decision Making Stable/Uncomplicated    Rehab Potential Good    PT Frequency 2x / week    PT Duration 6 weeks    PT Treatment/Interventions ADLs/Self Care Home Management;Moist Heat;Cryotherapy;Therapeutic activities;Therapeutic exercise;Neuromuscular re-education;Patient/family education;Manual techniques;Passive range of motion;Vasopneumatic Device;Joint Manipulations    PT Next Visit Plan Continue MWM c ER, contract/relax techniques for IR/ER.  Progress strengthening program  in available range avoiding shrug.    PT Home Exercise Plan Access Code: [email or web address]    Consulted and Agree with Plan of Care Patient           Patient will benefit from skilled therapeutic intervention in order to improve the following deficits and impairments:  Decreased activity tolerance,Decreased endurance,Decreased range of  motion,Decreased mobility,Decreased strength,Hypomobility,Increased edema,Impaired flexibility,Impaired UE functional use,Pain  Visit Diagnosis: Chronic left shoulder pain  Stiffness of left shoulder, not elsewhere classified  Muscle weakness (generalized)  Localized edema  Abnormal posture     Problem List Patient Active Problem List   Diagnosis Date Noted  . Adhesive capsulitis of left shoulder 03/27/2020  . Loose body in shoulder joint, left 03/27/2020  . Impingement syndrome of left shoulder 03/27/2020  . 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 Coronary artery disease   . PAF (paroxysmal atrial fibrillation) (HCC)   . Acute ST elevation myocardial infarction (STEMI) of inferior wall ([facility]) 02/24/2016  . HLD (hyperlipidemia) 08/19/2010    Chyrel Masson, PT, DPT, OCS, ATC 05/20/20  8:40 AM    [facility] Physical Therapy [address] [place], [place], [address] Phone: [phone]   Fax:  [phone]  Name: YIDA HYAMS MRN: [id number] Date of Birth: [birth year]

## 2020-05-21 DIAGNOSIS — E291 Testicular hypofunction: Secondary | ICD-10-CM | POA: Diagnosis not present

## 2020-05-22 ENCOUNTER — Ambulatory Visit (INDEPENDENT_AMBULATORY_CARE_PROVIDER_SITE_OTHER): Payer: BLUE CROSS/BLUE SHIELD | Admitting: Rehabilitative and Restorative Service Providers"

## 2020-05-22 ENCOUNTER — Other Ambulatory Visit: Payer: Self-pay

## 2020-05-22 ENCOUNTER — Encounter: Payer: Self-pay | Admitting: Rehabilitative and Restorative Service Providers"

## 2020-05-22 DIAGNOSIS — M6281 Muscle weakness (generalized): Secondary | ICD-10-CM

## 2020-05-22 DIAGNOSIS — M25612 Stiffness of left shoulder, not elsewhere classified: Secondary | ICD-10-CM

## 2020-05-22 DIAGNOSIS — R293 Abnormal posture: Secondary | ICD-10-CM

## 2020-05-22 DIAGNOSIS — M25512 Pain in left shoulder: Secondary | ICD-10-CM | POA: Diagnosis not present

## 2020-05-22 DIAGNOSIS — G8929 Other chronic pain: Secondary | ICD-10-CM

## 2020-05-22 DIAGNOSIS — R600 Localized edema: Secondary | ICD-10-CM

## 2020-05-22 NOTE — Therapy (Signed)
[facility] Physical Therapy [address] [place], [place], [address] Phone: [phone]   Fax:  [phone]  Physical Therapy Treatment  Patient Details  Name: Jacob Gibbs MRN: [id number] Date of Birth: 07/25/1960 Referring Provider (PT): Tarry Kos MD   Encounter Date: 05/22/2020   PT End of Session - 05/22/20 0812    Visit Number 15    Number of Visits 36    Date for PT Re-Evaluation 06/27/20    Progress Note Due on Visit 19    PT Start Time 0801    PT Stop Time 0840    PT Time Calculation (min) 39 min    Activity Tolerance Patient tolerated treatment well    Behavior During Therapy [facility] for tasks assessed/performed           Past Medical History:  Diagnosis Date  . Anxiety   . Coronary artery disease    a. 2009: mutivessel CAD s/p CABG   b. 01/2016: inf STEMI s/p DES to RCA  . DDD (degenerative disc disease), cervical   . History of tobacco use    REMOTE  . Hyperlipidemia   . PAF (paroxysmal atrial fibrillation) (HCC)    a. brief episode in dec 2005 wtih no recurrence. No indication for OAC  . Sleep apnea    uses cpap  . Syncope and collapse     Past Surgical History:  Procedure Laterality Date  . CARDIAC CATHETERIZATION N/A 02/24/2016   Procedure: Coronary Stent Intervention;  Surgeon: Tonny Bollman, MD;  Location: [facility] INVASIVE CV LAB;  Service: Cardiovascular;  Laterality: N/A;  . CARDIAC CATHETERIZATION N/A 02/24/2016   Procedure: Left Heart Cath and Cors/Grafts Angiography;  Surgeon: Tonny Bollman, MD;  Location: [facility] INVASIVE CV LAB;  Service: Cardiovascular;  Laterality: N/A;  . CORONARY ARTERY BYPASS GRAFT  02/06/08   X 3  . SHOULDER ARTHROSCOPY WITH ROTATOR CUFF REPAIR AND SUBACROMIAL DECOMPRESSION Right 09/21/2018   Procedure: RIGHT SHOULDER ARTHROSCOPY WITH EXTENSIVE DEBRIDEMENT, BICEPS TENODESIS, SUBACROMIAL DECOMPRESSION,  ROTATOR CUFF REPAIR;  Surgeon: Tarry Kos, MD;  Location: [facility];  Service:  Orthopedics;  Laterality: Right;  . SHOULDER ARTHROSCOPY WITH SUBACROMIAL DECOMPRESSION Left 03/27/2020   Procedure: LEFT SHOULDER ARTHROSCOPY WITH EXTENSIVE DEBRIDEMENT LABRUM AND BICEPS TENDON, SUBACROMIAL DECOMPRESSION, LYSIS OF ADHESIONS, MANIPULATION UNDER ANESTHESIA;  Surgeon: Tarry Kos, MD;  Location: [facility];  Service: Orthopedics;  Laterality: Left;    There were no vitals filed for this visit.   Subjective Assessment - 05/22/20 0809    Subjective Pt. denied pain upon arrival.  Pt. stated sleeping at night still troublesome from stiffness ache in front of shoulder, rated 5/10 or so.  Pt. indicated getting some movement and helping it feel less stiff can be helpful in middle of night.    Pertinent History CABG X 3.  OA C-spine and B shoulders    Limitations Lifting    Patient Stated Goals Return to normal L shoulder activities without pain (reaching, overhead function)    Currently in Pain? No/denies    Pain Score 5    at night   Pain Location Shoulder    Pain Orientation Anterior    Pain Descriptors / Indicators Aching;Sore;Tightness    Pain Onset More than a month ago    Pain Frequency Intermittent    Aggravating Factors  nighttime    Pain Relieving Factors movement for stretching    Effect of Pain on Daily Activities sleeping, lifting limited  [facility] Adult PT Treatment/Exercise - 05/22/20 0001      Shoulder Exercises: Standing   External Rotation Strengthening;Left   3x 15   Other Standing Exercises standing push up position 3 point lateral movement blue band x 10 each bilateral      Shoulder Exercises: ROM/Strengthening   UBE (Upper Arm Bike) Lvl 4.5 3 mins fwd/back each way    Lat Pull Other (comment)   x15 15 lbs, 2 x 15 lbs     Manual Therapy   Manual therapy comments g4 inferior jt mobs Lt Gh jt, ap mobs c ER MWM in 75 deg abd                       PT Long Term Goals - 05/10/20 1800       PT LONG TERM GOAL #1   Title Improve FOTO score to 66.    Baseline 52, was 34    Time 12    Period Weeks    Status On-going      PT LONG TERM GOAL #2   Title Improve L shoulder AROM to 90% of the uninvolved R.    Baseline Improving, see objective    Time 12    Period Weeks    Status On-going      PT LONG TERM GOAL #3   Title Improve L shoulder pain to consistently 0-3/10 on the Numeric Pain Rating Scale.    Baseline Can be 7+/10    Time 12    Period Weeks    Status On-going      PT LONG TERM GOAL #4   Title Improve L shoulder strength as assessed by functional scores, subjective self-report and MMT.    Baseline Poor    Time 12    Period Weeks    Status On-going      PT LONG TERM GOAL #5   Title Abdurahman will be independent with his long-term HEP at DC.    Time 12    Period Weeks    Status On-going                 Plan - 05/22/20 0830    Clinical Impression Statement Transitioning today to increased focus on strengthening throughout available range paired c manual and HEP for mobility gains.  Fatigue noted in exercise but overall good performance.  Continued gains in quality of movement in available range to improve functional strength.    Personal Factors and Comorbidities Comorbidity 1    Comorbidities Severe shoulder OA, previous CABG X 3 and cervical OA.    Examination-Activity Limitations Bathing;Dressing;Sleep;Hygiene/Grooming;Bed Mobility;Lift;Reach Overhead;Carry    Examination-Participation Restrictions Occupation;Community Activity    Stability/Clinical Decision Making Stable/Uncomplicated    Rehab Potential Good    PT Frequency 2x / week    PT Duration 6 weeks    PT Treatment/Interventions ADLs/Self Care Home Management;Moist Heat;Cryotherapy;Therapeutic activities;Therapeutic exercise;Neuromuscular re-education;Patient/family education;Manual techniques;Passive range of motion;Vasopneumatic Device;Joint Manipulations    PT Next Visit Plan Continue  MWM c ER, contract/relax techniques for IR/ER.  Continue progression of strengthening program in clinic in available range c HEP focus on end range stretching    PT Home Exercise Plan Access Code: [email or web address]    Consulted and Agree with Plan of Care Patient           Patient will benefit from skilled therapeutic intervention in order to improve the following deficits and impairments:  Decreased activity tolerance,Decreased endurance,Decreased range of motion,Decreased mobility,Decreased strength,Hypomobility,Increased edema,Impaired flexibility,Impaired  UE functional use,Pain  Visit Diagnosis: Chronic left shoulder pain  Stiffness of left shoulder, not elsewhere classified  Muscle weakness (generalized)  Localized edema  Abnormal posture     Problem List Patient Active Problem List   Diagnosis Date Noted  . Adhesive capsulitis of left shoulder 03/27/2020  . Loose body in shoulder joint, left 03/27/2020  . Impingement syndrome of left shoulder 03/27/2020  . 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 Coronary artery disease   . PAF (paroxysmal atrial fibrillation) (HCC)   . Acute ST elevation myocardial infarction (STEMI) of inferior wall (HCC) 02/24/2016  . HLD (hyperlipidemia) 08/19/2010    Chyrel Masson, PT, DPT, OCS, ATC 05/22/20  8:41 AM    [facility] Physical Therapy [address] [place], [place], [address] Phone: [phone]   Fax:  [phone]  Name: FABIO WAH MRN: [id number] Date of Birth: 1960/12/11

## 2020-05-23 ENCOUNTER — Other Ambulatory Visit: Payer: Self-pay

## 2020-05-23 MED ORDER — EZETIMIBE 10 MG PO TABS: 10.0000 mg | ORAL_TABLET | Freq: Every day | ORAL | 3 refills | Status: DC

## 2020-05-29 ENCOUNTER — Encounter: Payer: Self-pay | Admitting: Rehabilitative and Restorative Service Providers"

## 2020-05-29 ENCOUNTER — Ambulatory Visit (INDEPENDENT_AMBULATORY_CARE_PROVIDER_SITE_OTHER): Payer: BLUE CROSS/BLUE SHIELD | Admitting: Rehabilitative and Restorative Service Providers"

## 2020-05-29 ENCOUNTER — Other Ambulatory Visit: Payer: Self-pay

## 2020-05-29 DIAGNOSIS — R293 Abnormal posture: Secondary | ICD-10-CM

## 2020-05-29 DIAGNOSIS — M25612 Stiffness of left shoulder, not elsewhere classified: Secondary | ICD-10-CM | POA: Diagnosis not present

## 2020-05-29 DIAGNOSIS — M6281 Muscle weakness (generalized): Secondary | ICD-10-CM | POA: Diagnosis not present

## 2020-05-29 DIAGNOSIS — G8929 Other chronic pain: Secondary | ICD-10-CM

## 2020-05-29 DIAGNOSIS — R600 Localized edema: Secondary | ICD-10-CM | POA: Diagnosis not present

## 2020-05-29 DIAGNOSIS — M25512 Pain in left shoulder: Secondary | ICD-10-CM | POA: Diagnosis not present

## 2020-05-29 NOTE — Therapy (Signed)
[facility] [place], [place], [address] Phone: [phone]   Fax:  [phone]  Physical Therapy Treatment  Patient Details  Name: Jacob Gibbs MRN: [id number] Date of Birth: 11-22-1960 Referring Provider (PT): Leandrew Koyanagi MD   Encounter Date: 05/29/2020   PT End of Session - 05/29/20 0808    Visit Number 16    Number of Visits 36    Date for PT Re-Evaluation 06/27/20    Progress Note Due on Visit 26    PT Start Time 0803    PT Stop Time 0842    PT Time Calculation (min) 39 min    Activity Tolerance Patient tolerated treatment well    Behavior During Therapy [facility] for tasks assessed/performed           Past Medical History:  Diagnosis Date  . Anxiety   . Coronary artery disease    a. 2009: mutivessel CAD s/p CABG   b. 01/2016: inf STEMI s/p DES to RCA  . DDD (degenerative disc disease), cervical   . History of tobacco use    REMOTE  . Hyperlipidemia   . PAF (paroxysmal atrial fibrillation) (HCC)    a. brief episode in dec 2005 wtih no recurrence. No indication for [place]  . Sleep apnea    uses cpap  . Syncope and collapse     Past Surgical History:  Procedure Laterality Date  . CARDIAC CATHETERIZATION N/A 02/24/2016   Procedure: Coronary Stent Intervention;  Surgeon: Sherren Mocha, MD;  Location: [place] CV LAB;  Service: Cardiovascular;  Laterality: N/A;  . CARDIAC CATHETERIZATION N/A 02/24/2016   Procedure: Left Heart Cath and Cors/Grafts Angiography;  Surgeon: Sherren Mocha, MD;  Location: [place] CV LAB;  Service: Cardiovascular;  Laterality: N/A;  . CORONARY ARTERY BYPASS GRAFT  02/06/08   X 3  . SHOULDER ARTHROSCOPY WITH ROTATOR CUFF REPAIR AND SUBACROMIAL DECOMPRESSION Right 09/21/2018   Procedure: RIGHT SHOULDER ARTHROSCOPY WITH EXTENSIVE DEBRIDEMENT, BICEPS TENODESIS, SUBACROMIAL DECOMPRESSION,  ROTATOR CUFF REPAIR;  Surgeon: Leandrew Koyanagi, MD;  Location: [place];  Service:  Orthopedics;  Laterality: Right;  . SHOULDER ARTHROSCOPY WITH SUBACROMIAL DECOMPRESSION Left 03/27/2020   Procedure: LEFT SHOULDER ARTHROSCOPY WITH EXTENSIVE DEBRIDEMENT LABRUM AND BICEPS TENDON, SUBACROMIAL DECOMPRESSION, LYSIS OF ADHESIONS, MANIPULATION UNDER ANESTHESIA;  Surgeon: Leandrew Koyanagi, MD;  Location: [place];  Service: Orthopedics;  Laterality: Left;    There were no vitals filed for this visit.   Subjective Assessment - 05/29/20 0809    Subjective Pt. indicated feeling like he is trending better.  A little stiff today but reported that as usual.    Pertinent History CABG X 3.  OA C-spine and B shoulders    Limitations Lifting    Patient Stated Goals Return to normal L shoulder activities without pain (reaching, overhead function)    Currently in Pain? Yes    Pain Score 5     Pain Onset More than a month ago    Aggravating Factors  end range flexion, lift off on wall exercise                             [facility] Adult PT Treatment/Exercise - 05/29/20 0001      Shoulder Exercises: Supine   Other Supine Exercises supine horizontal abd green band 20x, d2 ext 20x      Shoulder Exercises: Pulleys   Flexion 2 minutes  ABduction 2 minutes      Shoulder Exercises: Stretch   Other Shoulder Stretches IR rope stretch c anterior wall block 30 sec x 3      Manual Therapy   Manual therapy comments G4 ap mobs in 60 deg abd, inferior mobs in scaption, abduction.  Contract/relax MET for IR/ER in 60 deg abd.  MWM HBB stretch 2 x 10                       PT Long Term Goals - 05/10/20 1800      PT LONG TERM GOAL #1   Title Improve FOTO score to 66.    Baseline 52, was 34    Time 12    Period Weeks    Status On-going      PT LONG TERM GOAL #2   Title Improve L shoulder AROM to 90% of the uninvolved R.    Baseline Improving, see objective    Time 12    Period Weeks    Status On-going      PT LONG TERM GOAL #3   Title Improve L  shoulder pain to consistently 0-3/10 on the Numeric Pain Rating Scale.    Baseline Can be 7+/10    Time 12    Period Weeks    Status On-going      PT LONG TERM GOAL #4   Title Improve L shoulder strength as assessed by functional scores, subjective self-report and MMT.    Baseline Poor    Time 12    Period Weeks    Status On-going      PT LONG TERM GOAL #5   Title Jacob Gibbs will be independent with his long-term HEP at DC.    Time 12    Period Weeks    Status On-going                 Plan - 05/29/20 0827    Clinical Impression Statement IR/ER tightness noted today with mild increase in tightness upon arrival but as previously noted, improvement noted c ther ex and manual intervention.  Improving tolerance to sleep reported at this time.   Pt. continued to present c [place] jt and scapular mobility deficits that affects overall range.    Personal Factors and Comorbidities Comorbidity 1    Comorbidities Severe shoulder OA, previous CABG X 3 and cervical OA.    Examination-Activity Limitations Bathing;Dressing;Sleep;Hygiene/Grooming;Bed Mobility;Lift;Reach Overhead;Carry    Examination-Participation Restrictions Occupation;Community Activity    Stability/Clinical Decision Making Stable/Uncomplicated    Rehab Potential Good    PT Frequency 2x / week    PT Duration 6 weeks    PT Treatment/Interventions ADLs/Self Care Home Management;Moist Heat;Cryotherapy;Therapeutic activities;Therapeutic exercise;Neuromuscular re-education;Patient/family education;Manual techniques;Passive range of motion;Vasopneumatic Device;Joint Manipulations    PT Next Visit Plan Continue manual c mobilizations c movement.   Continue progression of strengthening program in clinic in available range c HEP focus on end range stretching    PT Home Exercise Plan Access Code: [email or web address]    Consulted and Agree with Plan of Care Patient           Patient will benefit from skilled therapeutic intervention in order to  improve the following deficits and impairments:  Decreased activity tolerance,Decreased endurance,Decreased range of motion,Decreased mobility,Decreased strength,Hypomobility,Increased edema,Impaired flexibility,Impaired UE functional use,Pain  Visit Diagnosis: Chronic left shoulder pain  Stiffness of left shoulder, not elsewhere classified  Muscle weakness (generalized)  Localized edema  Abnormal posture     Problem  List Patient Active Problem List   Diagnosis Date Noted  . Adhesive capsulitis of left shoulder 03/27/2020  . Loose body in shoulder joint, left 03/27/2020  . Impingement syndrome of left shoulder 03/27/2020  . 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 Coronary artery disease   . PAF (paroxysmal atrial fibrillation) ([place])   . Acute ST elevation myocardial infarction (STEMI) of inferior wall ([place]) 02/24/2016  . HLD (hyperlipidemia) 08/19/2010    Scot Jun, PT, DPT, OCS, ATC 05/29/20  8:43 AM    [facility] Physical Therapy [address] [place], [place], [address] Phone: [phone]   Fax:  [phone]  Name: Jacob Gibbs MRN: [id number] Date of Birth: 1960-05-02

## 2020-05-30 ENCOUNTER — Other Ambulatory Visit: Payer: Self-pay | Admitting: Cardiovascular Disease

## 2020-06-06 ENCOUNTER — Ambulatory Visit (INDEPENDENT_AMBULATORY_CARE_PROVIDER_SITE_OTHER): Payer: BLUE CROSS/BLUE SHIELD | Admitting: Rehabilitative and Restorative Service Providers"

## 2020-06-06 ENCOUNTER — Encounter: Payer: Self-pay | Admitting: Rehabilitative and Restorative Service Providers"

## 2020-06-06 ENCOUNTER — Other Ambulatory Visit: Payer: Self-pay

## 2020-06-06 DIAGNOSIS — R293 Abnormal posture: Secondary | ICD-10-CM | POA: Diagnosis not present

## 2020-06-06 DIAGNOSIS — M25512 Pain in left shoulder: Secondary | ICD-10-CM | POA: Diagnosis not present

## 2020-06-06 DIAGNOSIS — G8929 Other chronic pain: Secondary | ICD-10-CM

## 2020-06-06 DIAGNOSIS — R600 Localized edema: Secondary | ICD-10-CM | POA: Diagnosis not present

## 2020-06-06 DIAGNOSIS — M6281 Muscle weakness (generalized): Secondary | ICD-10-CM

## 2020-06-06 DIAGNOSIS — M25612 Stiffness of left shoulder, not elsewhere classified: Secondary | ICD-10-CM

## 2020-06-06 NOTE — Therapy (Signed)
[facility] Physical Therapy [address] [place], [place], [address] Phone: [phone]   Fax:  [phone]  Physical Therapy Treatment  Patient Details  Name: Jacob Gibbs MRN: [id number] Date of Birth: Jan 12, 1961 Referring Provider (PT): Tarry Kos MD   Encounter Date: 06/06/2020   PT End of Session - 06/06/20 0810    Visit Number 17    Number of Visits 36    Date for PT Re-Evaluation 06/27/20    Progress Note Due on Visit 19    PT Start Time 0801    PT Stop Time 0840    PT Time Calculation (min) 39 min    Activity Tolerance Patient tolerated treatment well    Behavior During Therapy [facility] for tasks assessed/performed           Past Medical History:  Diagnosis Date  . Anxiety   . Coronary artery disease    a. 2009: mutivessel CAD s/p CABG   b. 01/2016: inf STEMI s/p DES to RCA  . DDD (degenerative disc disease), cervical   . History of tobacco use    REMOTE  . Hyperlipidemia   . PAF (paroxysmal atrial fibrillation) (HCC)    a. brief episode in dec 2005 wtih no recurrence. No indication for OAC  . Sleep apnea    uses cpap  . Syncope and collapse     Past Surgical History:  Procedure Laterality Date  . CARDIAC CATHETERIZATION N/A 02/24/2016   Procedure: Coronary Stent Intervention;  Surgeon: Tonny Bollman, MD;  Location: [facility] INVASIVE CV LAB;  Service: Cardiovascular;  Laterality: N/A;  . CARDIAC CATHETERIZATION N/A 02/24/2016   Procedure: Left Heart Cath and Cors/Grafts Angiography;  Surgeon: Tonny Bollman, MD;  Location: [facility] INVASIVE CV LAB;  Service: Cardiovascular;  Laterality: N/A;  . CORONARY ARTERY BYPASS GRAFT  02/06/08   X 3  . SHOULDER ARTHROSCOPY WITH ROTATOR CUFF REPAIR AND SUBACROMIAL DECOMPRESSION Right 09/21/2018   Procedure: RIGHT SHOULDER ARTHROSCOPY WITH EXTENSIVE DEBRIDEMENT, BICEPS TENODESIS, SUBACROMIAL DECOMPRESSION,  ROTATOR CUFF REPAIR;  Surgeon: Tarry Kos, MD;  Location: [facility];  Service:  Orthopedics;  Laterality: Right;  . SHOULDER ARTHROSCOPY WITH SUBACROMIAL DECOMPRESSION Left 03/27/2020   Procedure: LEFT SHOULDER ARTHROSCOPY WITH EXTENSIVE DEBRIDEMENT LABRUM AND BICEPS TENDON, SUBACROMIAL DECOMPRESSION, LYSIS OF ADHESIONS, MANIPULATION UNDER ANESTHESIA;  Surgeon: Tarry Kos, MD;  Location: [facility];  Service: Orthopedics;  Laterality: Left;    There were no vitals filed for this visit.   Subjective Assessment - 06/06/20 0808    Subjective Pt. stated no real pain upon arrival today.  Pt. stated he wants to improve his ability to lift Lt arm for tennis serve.    Pertinent History CABG X 3.  OA C-spine and B shoulders    Limitations Lifting    Patient Stated Goals Return to normal L shoulder activities without pain (reaching, overhead function)    Currently in Pain? No/denies    Pain Onset More than a month ago              [facility] PT Assessment - 06/06/20 0001      Assessment   Medical Diagnosis s/p L shoulder arthroscopy/adhesive capsulitis    Referring Provider (PT) Coralyn Mark Donnelly Stager MD    Onset Date/Surgical Date 03/27/20    Hand Dominance Right      Strength   Left Shoulder Flexion 4+/5    Left Shoulder ABduction 4/5    Left Shoulder Internal Rotation 5/5    Left  Shoulder External Rotation 4+/5                         [facility] Adult PT Treatment/Exercise - 06/06/20 0001      Shoulder Exercises: Standing   External Rotation Both   3 x 10 c flexion punch   Theraband Level (Shoulder External Rotation) Level 3 (Green)    Other Standing Exercises flexion to 90 deg c movement to abd to side and reverse x 10      Shoulder Exercises: Pulleys   Flexion 2 minutes    ABduction 2 minutes    Other Pulley Exercises 5 sec holds at end range      Shoulder Exercises: Stretch   Other Shoulder Stretches IR rope stretch 30 sec x 3    Other Shoulder Stretches ER doorway 30 sec x 5 Lt UE      Manual Therapy   Manual therapy comments MWM c  IR rope stretch HBB 2 x 10                       PT Long Term Goals - 06/06/20 0809      PT LONG TERM GOAL #1   Title Improve FOTO score to 66.    Time 12    Period Weeks    Status On-going    Target Date 06/27/20      PT LONG TERM GOAL #2   Title Improve L shoulder AROM to 90% of the uninvolved R.    Baseline Improving, see objective    Time 12    Period Weeks    Status On-going    Target Date 06/27/20      PT LONG TERM GOAL #3   Title Improve L shoulder pain to consistently 0-3/10 on the Numeric Pain Rating Scale.    Baseline Can be 7+/10    Time 12    Period Weeks    Status On-going    Target Date 06/27/20      PT LONG TERM GOAL #4   Title Pt. will demonstrate Lt GH MMT 4+/5 or greater to facilitate recreational sport return, lifting at PLOF.    Time 12    Period Weeks    Status Revised    Target Date 06/27/20      PT LONG TERM GOAL #5   Title Bran will be independent with his long-term HEP at DC.    Time 12    Period Weeks    Status On-going    Target Date 06/27/20                 Plan - 06/06/20 0827    Clinical Impression Statement Strength assessment today showed progress in available range.  Continued dynamic movement stability/strength to be improved for recreational sport activity return as well as continued end range progression in capsular restriction.    Personal Factors and Comorbidities Comorbidity 1    Comorbidities Severe shoulder OA, previous CABG X 3 and cervical OA.    Examination-Activity Limitations Bathing;Dressing;Sleep;Hygiene/Grooming;Bed Mobility;Lift;Reach Overhead;Carry    Examination-Participation Restrictions Occupation;Community Activity    Stability/Clinical Decision Making Stable/Uncomplicated    Rehab Potential Good    PT Frequency 2x / week    PT Duration 6 weeks    PT Treatment/Interventions ADLs/Self Care Home Management;Moist Heat;Cryotherapy;Therapeutic activities;Therapeutic exercise;Neuromuscular  re-education;Patient/family education;Manual techniques;Passive range of motion;Vasopneumatic Device;Joint Manipulations    PT Next Visit Plan Continue manual c mobilizations c movement.  Functional strengthening in diagonals, shoulder to overhead for recreational activity improvements.    PT Home Exercise Plan Access Code: [email or web address]    Consulted and Agree with Plan of Care Patient           Patient will benefit from skilled therapeutic intervention in order to improve the following deficits and impairments:  Decreased activity tolerance,Decreased endurance,Decreased range of motion,Decreased mobility,Decreased strength,Hypomobility,Increased edema,Impaired flexibility,Impaired UE functional use,Pain  Visit Diagnosis: Chronic left shoulder pain  Stiffness of left shoulder, not elsewhere classified  Muscle weakness (generalized)  Localized edema  Abnormal posture     Problem List Patient Active Problem List   Diagnosis Date Noted  . Adhesive capsulitis of left shoulder 03/27/2020  . Loose body in shoulder joint, left 03/27/2020  . Impingement syndrome of left shoulder 03/27/2020  . 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 Coronary artery disease   . PAF (paroxysmal atrial fibrillation) (HCC)   . Acute ST elevation myocardial infarction (STEMI) of inferior wall (HCC) 02/24/2016  . HLD (hyperlipidemia) 08/19/2010    Chyrel Masson, PT, DPT, OCS, ATC 06/06/20  8:36 AM    [facility] Physical Therapy [address] [place], [place], [address] Phone: [phone]   Fax:  [phone]  Name: Jacob Gibbs MRN: [id number] Date of Birth: 04-16-1960

## 2020-06-11 ENCOUNTER — Encounter: Payer: Self-pay | Admitting: Rehabilitative and Restorative Service Providers"

## 2020-06-11 ENCOUNTER — Ambulatory Visit (INDEPENDENT_AMBULATORY_CARE_PROVIDER_SITE_OTHER): Payer: BLUE CROSS/BLUE SHIELD | Admitting: Rehabilitative and Restorative Service Providers"

## 2020-06-11 ENCOUNTER — Other Ambulatory Visit: Payer: Self-pay

## 2020-06-11 DIAGNOSIS — M6281 Muscle weakness (generalized): Secondary | ICD-10-CM | POA: Diagnosis not present

## 2020-06-11 DIAGNOSIS — R293 Abnormal posture: Secondary | ICD-10-CM | POA: Diagnosis not present

## 2020-06-11 DIAGNOSIS — M25612 Stiffness of left shoulder, not elsewhere classified: Secondary | ICD-10-CM

## 2020-06-11 DIAGNOSIS — M25512 Pain in left shoulder: Secondary | ICD-10-CM

## 2020-06-11 DIAGNOSIS — G8929 Other chronic pain: Secondary | ICD-10-CM | POA: Diagnosis not present

## 2020-06-11 DIAGNOSIS — R600 Localized edema: Secondary | ICD-10-CM | POA: Diagnosis not present

## 2020-06-11 NOTE — Therapy (Signed)
[facility] [address] [place], [place], [address] Phone: [phone]   Fax:  [phone]  Physical Therapy Treatment  Patient Details  Name: Jacob Gibbs MRN: [id number] Date of Birth: December 11, 1960 Referring Provider (PT): Tarry Kos MD   Encounter Date: 06/11/2020   PT End of Session - 06/11/20 0833    Visit Number 18    Number of Visits 36    Date for PT Re-Evaluation 06/27/20    Progress Note Due on Visit 19    PT Start Time 0803    PT Stop Time 0841    PT Time Calculation (min) 38 min    Activity Tolerance Patient tolerated treatment well    Behavior During Therapy [facility] for tasks assessed/performed           Past Medical History:  Diagnosis Date  . Anxiety   . Coronary artery disease    a. 2009: mutivessel CAD s/p CABG   b. 01/2016: inf STEMI s/p DES to RCA  . DDD (degenerative disc disease), cervical   . History of tobacco use    REMOTE  . Hyperlipidemia   . PAF (paroxysmal atrial fibrillation) (HCC)    a. brief episode in dec 2005 wtih no recurrence. No indication for OAC  . Sleep apnea    uses cpap  . Syncope and collapse     Past Surgical History:  Procedure Laterality Date  . CARDIAC CATHETERIZATION N/A 02/24/2016   Procedure: Coronary Stent Intervention;  Surgeon: Tonny Bollman, MD;  Location: [facility] INVASIVE CV LAB;  Service: Cardiovascular;  Laterality: N/A;  . CARDIAC CATHETERIZATION N/A 02/24/2016   Procedure: Left Heart Cath and Cors/Grafts Angiography;  Surgeon: Tonny Bollman, MD;  Location: [facility] INVASIVE CV LAB;  Service: Cardiovascular;  Laterality: N/A;  . CORONARY ARTERY BYPASS GRAFT  02/06/08   X 3  . SHOULDER ARTHROSCOPY WITH ROTATOR CUFF REPAIR AND SUBACROMIAL DECOMPRESSION Right 09/21/2018   Procedure: RIGHT SHOULDER ARTHROSCOPY WITH EXTENSIVE DEBRIDEMENT, BICEPS TENODESIS, SUBACROMIAL DECOMPRESSION,  ROTATOR CUFF REPAIR;  Surgeon: Tarry Kos, MD;  Location: [facility];  Service:  Orthopedics;  Laterality: Right;  . SHOULDER ARTHROSCOPY WITH SUBACROMIAL DECOMPRESSION Left 03/27/2020   Procedure: LEFT SHOULDER ARTHROSCOPY WITH EXTENSIVE DEBRIDEMENT LABRUM AND BICEPS TENDON, SUBACROMIAL DECOMPRESSION, LYSIS OF ADHESIONS, MANIPULATION UNDER ANESTHESIA;  Surgeon: Tarry Kos, MD;  Location: [facility];  Service: Orthopedics;  Laterality: Left;    There were no vitals filed for this visit.   Subjective Assessment - 06/11/20 0820    Subjective Pt. stated doing fairly well, still tight.  no pain reported.    Pertinent History CABG X 3.  OA C-spine and B shoulders    Limitations Lifting    Patient Stated Goals Return to normal L shoulder activities without pain (reaching, overhead function)    Currently in Pain? No/denies    Pain Onset More than a month ago                             [facility] Adult PT Treatment/Exercise - 06/11/20 0001      Shoulder Exercises: Supine   Horizontal ABduction Both   2x 15   Theraband Level (Shoulder Horizontal ABduction) Level 4 (Blue)    Other Supine Exercises supine d2 extension blue band 2 x 15      Shoulder Exercises: Standing   Other Standing Exercises wall scaption c lift off x 15    Other Standing  Exercises standing horizontal abduction at wall c trunk rotation blue band 2 x 10 bilateral, standing 90 deg ball circles arm straight 2 lb ball 30 x2 cw, ccw      Shoulder Exercises: Pulleys   Flexion 2 minutes    ABduction 2 minutes    Other Pulley Exercises 5 sec holds at end range      Shoulder Exercises: Stretch   Other Shoulder Stretches ER doorway 30 sec x 5 Lt UE      Manual Therapy   Manual therapy comments g4 inferior jt mobs, ap mobs, prom c overpressure, lat stretching                       PT Long Term Goals - 06/06/20 0809      PT LONG TERM GOAL #1   Title Improve FOTO score to 66.    Time 12    Period Weeks    Status On-going    Target Date 06/27/20      PT  LONG TERM GOAL #2   Title Improve L shoulder AROM to 90% of the uninvolved R.    Baseline Improving, see objective    Time 12    Period Weeks    Status On-going    Target Date 06/27/20      PT LONG TERM GOAL #3   Title Improve L shoulder pain to consistently 0-3/10 on the Numeric Pain Rating Scale.    Baseline Can be 7+/10    Time 12    Period Weeks    Status On-going    Target Date 06/27/20      PT LONG TERM GOAL #4   Title Pt. will demonstrate Lt GH MMT 4+/5 or greater to facilitate recreational sport return, lifting at PLOF.    Time 12    Period Weeks    Status Revised    Target Date 06/27/20      PT LONG TERM GOAL #5   Title Jacob Gibbs will be independent with his long-term HEP at DC.    Time 12    Period Weeks    Status On-going    Target Date 06/27/20                 Plan - 06/11/20 0820    Clinical Impression Statement Attempts for prone scaption, horizontal abduction greatly limited in ability to perform due to mobility restrictions mostly today c some indication of weakness in movement.    Personal Factors and Comorbidities Comorbidity 1    Comorbidities Severe shoulder OA, previous CABG X 3 and cervical OA.    Examination-Activity Limitations Bathing;Dressing;Sleep;Hygiene/Grooming;Bed Mobility;Lift;Reach Overhead;Carry    Examination-Participation Restrictions Occupation;Community Activity    Stability/Clinical Decision Making Stable/Uncomplicated    Rehab Potential Good    PT Frequency 2x / week    PT Duration 6 weeks    PT Treatment/Interventions ADLs/Self Care Home Management;Moist Heat;Cryotherapy;Therapeutic activities;Therapeutic exercise;Neuromuscular re-education;Patient/family education;Manual techniques;Passive range of motion;Vasopneumatic Device;Joint Manipulations    PT Next Visit Plan Continue end range mobility progression, gravity based and functional strengthening/endurance.    PT Home Exercise Plan Access Code: [email or web address]    Consulted and Agree  with Plan of Care Patient           Patient will benefit from skilled therapeutic intervention in order to improve the following deficits and impairments:  Decreased activity tolerance,Decreased endurance,Decreased range of motion,Decreased mobility,Decreased strength,Hypomobility,Increased edema,Impaired flexibility,Impaired UE functional use,Pain  Visit Diagnosis: Chronic left shoulder pain  Stiffness of  left shoulder, not elsewhere classified  Muscle weakness (generalized)  Localized edema  Abnormal posture     Problem List Patient Active Problem List   Diagnosis Date Noted  . Adhesive capsulitis of left shoulder 03/27/2020  . Loose body in shoulder joint, left 03/27/2020  . Impingement syndrome of left shoulder 03/27/2020  . 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 Coronary artery disease   . PAF (paroxysmal atrial fibrillation) (HCC)   . Acute ST elevation myocardial infarction (STEMI) of inferior wall (HCC) 02/24/2016  . HLD (hyperlipidemia) 08/19/2010   Chyrel Masson, PT, DPT, OCS, ATC 06/11/20  8:35 AM    [facility] [address] [place], [place], [address] Phone: [phone]   Fax:  [phone]  Name: Jacob Gibbs MRN: [id number] Date of Birth: September 05, 1960

## 2020-06-13 ENCOUNTER — Other Ambulatory Visit: Payer: Self-pay

## 2020-06-13 ENCOUNTER — Ambulatory Visit (INDEPENDENT_AMBULATORY_CARE_PROVIDER_SITE_OTHER): Payer: BLUE CROSS/BLUE SHIELD | Admitting: Rehabilitative and Restorative Service Providers"

## 2020-06-13 ENCOUNTER — Encounter: Payer: Self-pay | Admitting: Rehabilitative and Restorative Service Providers"

## 2020-06-13 DIAGNOSIS — R293 Abnormal posture: Secondary | ICD-10-CM

## 2020-06-13 DIAGNOSIS — M25612 Stiffness of left shoulder, not elsewhere classified: Secondary | ICD-10-CM | POA: Diagnosis not present

## 2020-06-13 DIAGNOSIS — M25512 Pain in left shoulder: Secondary | ICD-10-CM | POA: Diagnosis not present

## 2020-06-13 DIAGNOSIS — M6281 Muscle weakness (generalized): Secondary | ICD-10-CM

## 2020-06-13 DIAGNOSIS — G8929 Other chronic pain: Secondary | ICD-10-CM | POA: Diagnosis not present

## 2020-06-13 DIAGNOSIS — R600 Localized edema: Secondary | ICD-10-CM | POA: Diagnosis not present

## 2020-06-13 NOTE — Therapy (Signed)
[facility] [address] [place], [place], [address] Phone: [phone]   Fax:  [phone]  Physical Therapy Treatment  Patient Details  Name: Jacob Gibbs MRN: [id number] Date of Birth: 1960-06-04 Referring Provider (PT): Tarry Kos MD   Encounter Date: 06/13/2020   PT End of Session - 06/13/20 0829    Visit Number 19    Number of Visits 36    Date for PT Re-Evaluation 06/27/20    Progress Note Due on Visit 20    PT Start Time 0814    PT Stop Time 0844    PT Time Calculation (min) 30 min    Activity Tolerance Patient tolerated treatment well    Behavior During Therapy [facility] for tasks assessed/performed           Past Medical History:  Diagnosis Date  . Anxiety   . Coronary artery disease    a. 2009: mutivessel CAD s/p CABG   b. 01/2016: inf STEMI s/p DES to RCA  . DDD (degenerative disc disease), cervical   . History of tobacco use    REMOTE  . Hyperlipidemia   . PAF (paroxysmal atrial fibrillation) (HCC)    a. brief episode in dec 2005 wtih no recurrence. No indication for OAC  . Sleep apnea    uses cpap  . Syncope and collapse     Past Surgical History:  Procedure Laterality Date  . CARDIAC CATHETERIZATION N/A 02/24/2016   Procedure: Coronary Stent Intervention;  Surgeon: Tonny Bollman, MD;  Location: [facility] INVASIVE CV LAB;  Service: Cardiovascular;  Laterality: N/A;  . CARDIAC CATHETERIZATION N/A 02/24/2016   Procedure: Left Heart Cath and Cors/Grafts Angiography;  Surgeon: Tonny Bollman, MD;  Location: [facility] INVASIVE CV LAB;  Service: Cardiovascular;  Laterality: N/A;  . CORONARY ARTERY BYPASS GRAFT  02/06/08   X 3  . SHOULDER ARTHROSCOPY WITH ROTATOR CUFF REPAIR AND SUBACROMIAL DECOMPRESSION Right 09/21/2018   Procedure: RIGHT SHOULDER ARTHROSCOPY WITH EXTENSIVE DEBRIDEMENT, BICEPS TENODESIS, SUBACROMIAL DECOMPRESSION,  ROTATOR CUFF REPAIR;  Surgeon: Tarry Kos, MD;  Location: [facility];  Service:  Orthopedics;  Laterality: Right;  . SHOULDER ARTHROSCOPY WITH SUBACROMIAL DECOMPRESSION Left 03/27/2020   Procedure: LEFT SHOULDER ARTHROSCOPY WITH EXTENSIVE DEBRIDEMENT LABRUM AND BICEPS TENDON, SUBACROMIAL DECOMPRESSION, LYSIS OF ADHESIONS, MANIPULATION UNDER ANESTHESIA;  Surgeon: Tarry Kos, MD;  Location: [facility];  Service: Orthopedics;  Laterality: Left;    There were no vitals filed for this visit.   Subjective Assessment - 06/13/20 0827    Subjective Pt. stated noticing some increase sore last night.    Pertinent History CABG X 3.  OA C-spine and B shoulders    Limitations Lifting    Patient Stated Goals Return to normal L shoulder activities without pain (reaching, overhead function)    Currently in Pain? No/denies   just sore/tight   Pain Score 0-No pain    Pain Location Shoulder    Pain Orientation Left    Pain Descriptors / Indicators Tightness;Sore    Pain Onset More than a month ago    Pain Frequency Intermittent    Aggravating Factors  nighttime    Pain Relieving Factors stretching, movement              [facility] PT Assessment - 06/13/20 0001      Assessment   Medical Diagnosis s/p L shoulder arthroscopy/adhesive capsulitis    Referring Provider (PT) Coralyn Mark Donnelly Stager MD    Onset Date/Surgical Date 03/27/20  Hand Dominance Right      Strength   Right Shoulder Flexion --   dynamometer seated 90 deg hold : 23.5, 17.5 lbs   Left Shoulder Flexion --   dynamometer seated 90 deg hold : 9.1, 10.3 lbs                        [facility] Adult PT Treatment/Exercise - 06/13/20 0001      Shoulder Exercises: Sidelying   Flexion Both   3 x 15 to 100 deg   Flexion Weight (lbs) 3    ABduction Both   3 x 15   ABduction Weight (lbs) 3      Shoulder Exercises: Standing   External Rotation Left   3x 10   Theraband Level (Shoulder External Rotation) Level 3 (Green)    Other Standing Exercises wall scaption c lift off x 15      Shoulder Exercises:  ROM/Strengthening   Lat Pull Other (comment)   3 x 15 20 lbs     Manual Therapy   Manual therapy comments g4 inferior jt mobs, MWM c ER in 50 deg abd 2 x 10, lat compression c stretching into elevation                       PT Long Term Goals - 06/06/20 0809      PT LONG TERM GOAL #1   Title Improve FOTO score to 66.    Time 12    Period Weeks    Status On-going    Target Date 06/27/20      PT LONG TERM GOAL #2   Title Improve L shoulder AROM to 90% of the uninvolved R.    Baseline Improving, see objective    Time 12    Period Weeks    Status On-going    Target Date 06/27/20      PT LONG TERM GOAL #3   Title Improve L shoulder pain to consistently 0-3/10 on the Numeric Pain Rating Scale.    Baseline Can be 7+/10    Time 12    Period Weeks    Status On-going    Target Date 06/27/20      PT LONG TERM GOAL #4   Title Pt. will demonstrate Lt GH MMT 4+/5 or greater to facilitate recreational sport return, lifting at PLOF.    Time 12    Period Weeks    Status Revised    Target Date 06/27/20      PT LONG TERM GOAL #5   Title Constant will be independent with his long-term HEP at DC.    Time 12    Period Weeks    Status On-going    Target Date 06/27/20                 Plan - 06/13/20 0829    Clinical Impression Statement Dynamometry assessment revealed Lt flexion strength approx. 50% of Rt at this time.  Continued strengthening indicated in addition to continue capsular and myofascial stretching.    Personal Factors and Comorbidities Comorbidity 1    Comorbidities Severe shoulder OA, previous CABG X 3 and cervical OA.    Examination-Activity Limitations Bathing;Dressing;Sleep;Hygiene/Grooming;Bed Mobility;Lift;Reach Overhead;Carry    Examination-Participation Restrictions Occupation;Community Activity    Stability/Clinical Decision Making Stable/Uncomplicated    Rehab Potential Good    PT Frequency 2x / week    PT Duration 6 weeks    PT  Treatment/Interventions ADLs/Self  Care Home Management;Moist Heat;Cryotherapy;Therapeutic activities;Therapeutic exercise;Neuromuscular re-education;Patient/family education;Manual techniques;Passive range of motion;Vasopneumatic Device;Joint Manipulations    PT Next Visit Plan Progress note next visit (pushed to next visit due to Pt. request to leave to get home for delivery).    PT Home Exercise Plan Access Code: [email or web address]    Consulted and Agree with Plan of Care Patient           Patient will benefit from skilled therapeutic intervention in order to improve the following deficits and impairments:  Decreased activity tolerance,Decreased endurance,Decreased range of motion,Decreased mobility,Decreased strength,Hypomobility,Increased edema,Impaired flexibility,Impaired UE functional use,Pain  Visit Diagnosis: Chronic left shoulder pain  Stiffness of left shoulder, not elsewhere classified  Muscle weakness (generalized)  Localized edema  Abnormal posture     Problem List Patient Active Problem List   Diagnosis Date Noted  . Adhesive capsulitis of left shoulder 03/27/2020  . Loose body in shoulder joint, left 03/27/2020  . Impingement syndrome of left shoulder 03/27/2020  . 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 Coronary artery disease   . PAF (paroxysmal atrial fibrillation) (HCC)   . Acute ST elevation myocardial infarction (STEMI) of inferior wall (HCC) 02/24/2016  . HLD (hyperlipidemia) 08/19/2010    Chyrel Masson, PT, DPT, OCS, ATC 06/13/20  8:47 AM    [facility] Therapy [address] [place], [place], [address] Phone: [phone]   Fax:  [phone]  Name: ROMEO ZIELINSKI MRN: [id number] Date of Birth: 05/29/1960

## 2020-06-18 ENCOUNTER — Ambulatory Visit (INDEPENDENT_AMBULATORY_CARE_PROVIDER_SITE_OTHER): Payer: BLUE CROSS/BLUE SHIELD | Admitting: Rehabilitative and Restorative Service Providers"

## 2020-06-18 ENCOUNTER — Other Ambulatory Visit: Payer: Self-pay

## 2020-06-18 ENCOUNTER — Encounter: Payer: Self-pay | Admitting: Rehabilitative and Restorative Service Providers"

## 2020-06-18 DIAGNOSIS — R600 Localized edema: Secondary | ICD-10-CM | POA: Diagnosis not present

## 2020-06-18 DIAGNOSIS — G8929 Other chronic pain: Secondary | ICD-10-CM

## 2020-06-18 DIAGNOSIS — M25512 Pain in left shoulder: Secondary | ICD-10-CM

## 2020-06-18 DIAGNOSIS — M25612 Stiffness of left shoulder, not elsewhere classified: Secondary | ICD-10-CM

## 2020-06-18 DIAGNOSIS — R293 Abnormal posture: Secondary | ICD-10-CM

## 2020-06-18 DIAGNOSIS — M6281 Muscle weakness (generalized): Secondary | ICD-10-CM | POA: Diagnosis not present

## 2020-06-18 NOTE — Therapy (Signed)
[place], [place], [address] Phone: [phone]   Fax:  [phone]  Physical Therapy Treatment/Progress Note  Patient Details  Name: Jacob Gibbs MRN: [id number] Date of Birth: March 07, 1961 Referring Provider (PT): Leandrew Koyanagi MD   Encounter Date: 06/18/2020   Progress Note Reporting Period 05/03/2020 to 06/18/2020  See note below for Objective Data and Assessment of Progress/Goals.        PT End of Session - 06/18/20 0809    Visit Number 20    Number of Visits 36    Date for PT Re-Evaluation 06/27/20    Progress Note Due on Visit 20    PT Start Time 0807    PT Stop Time 0845    PT Time Calculation (min) 38 min    Activity Tolerance Patient tolerated treatment well    Behavior During Therapy [facility] for tasks assessed/performed           Past Medical History:  Diagnosis Date  . Anxiety   . Coronary artery disease    a. 2009: mutivessel CAD s/p CABG   b. 01/2016: inf STEMI s/p DES to RCA  . DDD (degenerative disc disease), cervical   . History of tobacco use    REMOTE  . Hyperlipidemia   . PAF (paroxysmal atrial fibrillation) (HCC)    a. brief episode in dec 2005 wtih no recurrence. No indication for [place]  . Sleep apnea    uses cpap  . Syncope and collapse     Past Surgical History:  Procedure Laterality Date  . CARDIAC CATHETERIZATION N/A 02/24/2016   Procedure: Coronary Stent Intervention;  Surgeon: Sherren Mocha, MD;  Location: [facility] CV LAB;  Service: Cardiovascular;  Laterality: N/A;  . CARDIAC CATHETERIZATION N/A 02/24/2016   Procedure: Left Heart Cath and Cors/Grafts Angiography;  Surgeon: Sherren Mocha, MD;  Location: [place] CV LAB;  Service: Cardiovascular;  Laterality: N/A;  . CORONARY ARTERY BYPASS GRAFT  02/06/08   X 3  . SHOULDER ARTHROSCOPY WITH ROTATOR CUFF REPAIR AND SUBACROMIAL DECOMPRESSION Right 09/21/2018   Procedure: RIGHT SHOULDER ARTHROSCOPY WITH EXTENSIVE  DEBRIDEMENT, BICEPS TENODESIS, SUBACROMIAL DECOMPRESSION,  ROTATOR CUFF REPAIR;  Surgeon: Leandrew Koyanagi, MD;  Location: [place];  Service: Orthopedics;  Laterality: Right;  . SHOULDER ARTHROSCOPY WITH SUBACROMIAL DECOMPRESSION Left 03/27/2020   Procedure: LEFT SHOULDER ARTHROSCOPY WITH EXTENSIVE DEBRIDEMENT LABRUM AND BICEPS TENDON, SUBACROMIAL DECOMPRESSION, LYSIS OF ADHESIONS, MANIPULATION UNDER ANESTHESIA;  Surgeon: Leandrew Koyanagi, MD;  Location: [place];  Service: Orthopedics;  Laterality: Left;    There were no vitals filed for this visit.   Subjective Assessment - 06/18/20 0808    Subjective Pt. stated he has increased soreness since last visit following having to move furniture and work on washer.  Led to less HEP.    Pertinent History CABG X 3.  OA C-spine and B shoulders    Limitations Lifting    Patient Stated Goals Return to normal L shoulder activities without pain (reaching, overhead function)    Currently in Pain? No/denies    Pain Location Shoulder    Pain Onset More than a month ago              [facility] PT Assessment - 06/18/20 0001      Assessment   Medical Diagnosis s/p L shoulder arthroscopy/adhesive capsulitis    Referring Provider (PT) Georga Kaufmann Ephriam Jenkins MD    Onset Date/Surgical Date 03/27/20    Hand Dominance Right  Strength   Right Shoulder Flexion 5/5   dynamometer seated 90 deg hold : 23.5, 17.5 lbs (from last visit)   Left Shoulder Flexion 4+/5   dynamometer seated 90 deg hold : 9.1, 10.3 lbs  (from last visit)   Left Shoulder ABduction 4/5    Left Shoulder Internal Rotation 5/5    Left Shoulder External Rotation 4+/5                         [facility] Adult PT Treatment/Exercise - 06/18/20 0001      Shoulder Exercises: Standing   Flexion Both   3 x 15 2 lbs 0-90 degrees   Other Standing Exercises wall scaption c lift off 2 x 10 c green band hold out    Other Standing Exercises standing flexion 90 deg to abd  to side 2 x 10, horizontal abd wand 5 sec hold x20      Shoulder Exercises: ROM/Strengthening   Lat Pull Other (comment)   3 x 15 20 lb     Manual Therapy   Manual therapy comments g4 inferior jt mobs in flexion, scaption, abd                       PT Long Term Goals - 06/18/20 3532      PT LONG TERM GOAL #1   Title Improve FOTO score to 66.    Time 12    Period Weeks    Status On-going    Target Date 06/27/20      PT LONG TERM GOAL #2   Title Improve L shoulder AROM to 90% of the uninvolved R.    Baseline Improving, see objective    Time 12    Period Weeks    Status On-going    Target Date 06/27/20      PT LONG TERM GOAL #3   Title Improve L shoulder pain to consistently 0-3/10 on the Numeric Pain Rating Scale.    Baseline Can be 7+/10    Time 12    Period Weeks    Status On-going    Target Date 06/27/20      PT LONG TERM GOAL #4   Title Pt. will demonstrate Lt [place] MMT 4+/5 or greater to facilitate recreational sport return, lifting at PLOF.    Time 12    Period Weeks    Status Partially Met    Target Date 06/27/20      PT LONG TERM GOAL #5   Title Armonte will be independent with his long-term HEP at DC.    Time 12    Period Weeks    Status On-going    Target Date 06/27/20                 Plan - 06/18/20 0824    Clinical Impression Statement Pt. has continued to show slow and steady overall progress at this time in mobility and strength gains but still limited in Lt UE compared to Rt. Conitnued focus on strengthening for UE at this time in elevation against gravity as shrug has decreased over time.  Pt. benefits from manual at start of session to free up movement to allow progression in active movements.    Personal Factors and Comorbidities Comorbidity 1    Comorbidities Severe shoulder OA, previous CABG X 3 and cervical OA.    Examination-Activity Limitations Bathing;Dressing;Sleep;Hygiene/Grooming;Bed Mobility;Lift;Reach Overhead;Carry     Examination-Participation Restrictions Occupation;Community Activity  Stability/Clinical Decision Making Stable/Uncomplicated    Rehab Potential Good    PT Frequency 2x / week    PT Duration 6 weeks    PT Treatment/Interventions ADLs/Self Care Home Management;Moist Heat;Cryotherapy;Therapeutic activities;Therapeutic exercise;Neuromuscular re-education;Patient/family education;Manual techniques;Passive range of motion;Vasopneumatic Device;Joint Manipulations    PT Next Visit Plan Continued elevation strengthening, end range mobility gains.    PT Home Exercise Plan Access Code: [email or web address]    Consulted and Agree with Plan of Care Patient           Patient will benefit from skilled therapeutic intervention in order to improve the following deficits and impairments:  Decreased activity tolerance,Decreased endurance,Decreased range of motion,Decreased mobility,Decreased strength,Hypomobility,Increased edema,Impaired flexibility,Impaired UE functional use,Pain  Visit Diagnosis: Chronic left shoulder pain  Stiffness of left shoulder, not elsewhere classified  Muscle weakness (generalized)  Localized edema  Abnormal posture     Problem List Patient Active Problem List   Diagnosis Date Noted  . Adhesive capsulitis of left shoulder 03/27/2020  . Loose body in shoulder joint, left 03/27/2020  . Impingement syndrome of left shoulder 03/27/2020  . 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 Coronary artery disease   . PAF (paroxysmal atrial fibrillation) ([place])   . Acute ST elevation myocardial infarction (STEMI) of inferior wall ([place]) 02/24/2016  . HLD (hyperlipidemia) 08/19/2010   Scot Jun, PT, DPT, OCS, ATC 06/18/20  8:37 AM    [facility] Physical Therapy [address] [place], [place], [address] Phone: [phone]   Fax:  [phone]  Name: Jacob Gibbs MRN: [id number] Date of Birth: 09/01/1960

## 2020-06-20 ENCOUNTER — Encounter: Payer: Self-pay | Admitting: Rehabilitative and Restorative Service Providers"

## 2020-06-20 ENCOUNTER — Ambulatory Visit (INDEPENDENT_AMBULATORY_CARE_PROVIDER_SITE_OTHER): Payer: BLUE CROSS/BLUE SHIELD | Admitting: Rehabilitative and Restorative Service Providers"

## 2020-06-20 ENCOUNTER — Other Ambulatory Visit: Payer: Self-pay

## 2020-06-20 DIAGNOSIS — M6281 Muscle weakness (generalized): Secondary | ICD-10-CM

## 2020-06-20 DIAGNOSIS — M25512 Pain in left shoulder: Secondary | ICD-10-CM

## 2020-06-20 DIAGNOSIS — R293 Abnormal posture: Secondary | ICD-10-CM

## 2020-06-20 DIAGNOSIS — M25612 Stiffness of left shoulder, not elsewhere classified: Secondary | ICD-10-CM

## 2020-06-20 DIAGNOSIS — G8929 Other chronic pain: Secondary | ICD-10-CM

## 2020-06-20 DIAGNOSIS — R600 Localized edema: Secondary | ICD-10-CM

## 2020-06-20 NOTE — Therapy (Signed)
[facility] [place], [place], [address] Phone: [phone]   Fax:  [phone]  Physical Therapy Treatment  Patient Details  Name: Jacob Gibbs MRN: [id number] Date of Birth: 07-Mar-1961 Referring Provider (PT): Leandrew Koyanagi MD   Encounter Date: 06/20/2020   PT End of Session - 06/20/20 0903    Visit Number 21    Number of Visits 36    Date for PT Re-Evaluation 06/27/20    Progress Note Due on Visit 20    PT Start Time 0853    PT Stop Time 0924    PT Time Calculation (min) 31 min    Activity Tolerance Patient tolerated treatment well    Behavior During Therapy [facility] for tasks assessed/performed           Past Medical History:  Diagnosis Date  . Anxiety   . Coronary artery disease    a. 2009: mutivessel CAD s/p CABG   b. 01/2016: inf STEMI s/p DES to RCA  . DDD (degenerative disc disease), cervical   . History of tobacco use    REMOTE  . Hyperlipidemia   . PAF (paroxysmal atrial fibrillation) (HCC)    a. brief episode in dec 2005 wtih no recurrence. No indication for [place]  . Sleep apnea    uses cpap  . Syncope and collapse     Past Surgical History:  Procedure Laterality Date  . CARDIAC CATHETERIZATION N/A 02/24/2016   Procedure: Coronary Stent Intervention;  Surgeon: Sherren Mocha, MD;  Location: [place] [facility];  Service: Cardiovascular;  Laterality: N/A;  . CARDIAC CATHETERIZATION N/A 02/24/2016   Procedure: Left Heart Cath and Cors/Grafts Angiography;  Surgeon: Sherren Mocha, MD;  Location: [place] [facility];  Service: Cardiovascular;  Laterality: N/A;  . CORONARY ARTERY BYPASS GRAFT  02/06/08   X 3  . SHOULDER ARTHROSCOPY WITH ROTATOR CUFF REPAIR AND SUBACROMIAL DECOMPRESSION Right 09/21/2018   Procedure: RIGHT SHOULDER ARTHROSCOPY WITH EXTENSIVE DEBRIDEMENT, BICEPS TENODESIS, SUBACROMIAL DECOMPRESSION,  ROTATOR CUFF REPAIR;  Surgeon: Leandrew Koyanagi, MD;  Location: [facility];  Service:  Orthopedics;  Laterality: Right;  . SHOULDER ARTHROSCOPY WITH SUBACROMIAL DECOMPRESSION Left 03/27/2020   Procedure: LEFT SHOULDER ARTHROSCOPY WITH EXTENSIVE DEBRIDEMENT LABRUM AND BICEPS TENDON, SUBACROMIAL DECOMPRESSION, LYSIS OF ADHESIONS, MANIPULATION UNDER ANESTHESIA;  Surgeon: Leandrew Koyanagi, MD;  Location: [facility];  Service: Orthopedics;  Laterality: Left;    There were no vitals filed for this visit.   Subjective Assessment - 06/20/20 0902    Subjective Pt. stated having some back pain today.  Some pain reported in shoulder as well as last night    Pertinent History CABG X 3.  OA C-spine and B shoulders    Limitations Lifting    Patient Stated Goals Return to normal L shoulder activities without pain (reaching, overhead function)    Currently in Pain? Yes    Pain Score 0-No pain    Pain Location Shoulder    Pain Orientation Left    Pain Descriptors / Indicators Sore;Tightness    Pain Onset More than a month ago    Aggravating Factors  nighttime    Pain Relieving Factors stretching                             [facility] Adult PT Treatment/Exercise - 06/20/20 0001      Shoulder Exercises: Standing   Flexion Both   3 x 10 2  lbs 0-100 deg   Other Standing Exercises horizontal abd c trunk rotation at wall 2 x 10 bilateral,      Shoulder Exercises: ROM/Strengthening   Lat Pull Other (comment)   3 x 10 20 lbs   Cybex Row Other (comment)      Manual Therapy   Manual therapy comments g4 inferior jt mobs in flexion, scaption and abd.  Lat MET for elevation gains in flexion, abd.  IR MET to infraspinatus in 70 deg abd                       PT Long Term Goals - 06/18/20 0832      PT LONG TERM GOAL #1   Title Improve FOTO score to 66.    Time 12    Period Weeks    Status On-going    Target Date 06/27/20      PT LONG TERM GOAL #2   Title Improve L shoulder AROM to 90% of the uninvolved R.    Baseline Improving, see objective     Time 12    Period Weeks    Status On-going    Target Date 06/27/20      PT LONG TERM GOAL #3   Title Improve L shoulder pain to consistently 0-3/10 on the Numeric Pain Rating Scale.    Baseline Can be 7+/10    Time 12    Period Weeks    Status On-going    Target Date 06/27/20      PT LONG TERM GOAL #4   Title Pt. will demonstrate Lt [place] MMT 4+/5 or greater to facilitate recreational sport return, lifting at PLOF.    Time 12    Period Weeks    Status Partially Met    Target Date 06/27/20      PT LONG TERM GOAL #5   Title Ali will be independent with his long-term HEP at [place].    Time 12    Period Weeks    Status On-going    Target Date 06/27/20                 Plan - 06/20/20 0924    Clinical Impression Statement Overall similar mobility presentation today as last viist.  Continued emphasis on daily routine end range stretching with progression of elevation strengthening.    Personal Factors and Comorbidities Comorbidity 1    Comorbidities Severe shoulder OA, previous CABG X 3 and cervical OA.    Examination-Activity Limitations Bathing;Dressing;Sleep;Hygiene/Grooming;Bed Mobility;Lift;Reach Overhead;Carry    Examination-Participation Restrictions Occupation;Community Activity    Stability/Clinical Decision Making Stable/Uncomplicated    Rehab Potential Good    PT Frequency 2x / week    PT Duration 6 weeks    PT Treatment/Interventions ADLs/Self Care Home Management;Moist Heat;Cryotherapy;Therapeutic activities;Therapeutic exercise;Neuromuscular re-education;Patient/family education;Manual techniques;Passive range of motion;Vasopneumatic Device;Joint Manipulations    PT Next Visit Plan Continued elevation strengthening, end range mobility gains.    PT Home Exercise Plan Access Code: [email or web address]    Consulted and Agree with Plan of Care Patient           Patient will benefit from skilled therapeutic intervention in order to improve the following deficits and  impairments:  Decreased activity tolerance,Decreased endurance,Decreased range of motion,Decreased mobility,Decreased strength,Hypomobility,Increased edema,Impaired flexibility,Impaired UE functional use,Pain  Visit Diagnosis: Chronic left shoulder pain  Stiffness of left shoulder, not elsewhere classified  Muscle weakness (generalized)  Localized edema  Abnormal posture     Problem List Patient Active Problem  List   Diagnosis Date Noted  . Adhesive capsulitis of left shoulder 03/27/2020  . Loose body in shoulder joint, left 03/27/2020  . Impingement syndrome of left shoulder 03/27/2020  . 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 Coronary artery disease   . PAF (paroxysmal atrial fibrillation) ([place])   . Acute ST elevation myocardial infarction (STEMI) of inferior wall ([place]) 02/24/2016  . HLD (hyperlipidemia) 08/19/2010    Scot Jun, PT, DPT, OCS, ATC 06/20/20  9:27 AM    [facility] [address] [place], [place], [address] Phone: [phone]   Fax:  [phone]  Name: Jacob Gibbs MRN: [id number] Date of Birth: 28-Apr-1960

## 2020-06-25 ENCOUNTER — Encounter: Payer: BLUE CROSS/BLUE SHIELD | Admitting: Rehabilitative and Restorative Service Providers"

## 2020-06-26 IMAGING — MR MR SHOULDER*R* W/CM
6 series · 40 of 40 positions shown · IV contrast (agent unspecified)
Comparison: Image from contrast injection reviewed..

CLINICAL DATA: Right shoulder pain and decreased range of motion.

EXAM:
MR ARTHROGRAM OF THE RIGHT SHOULDER
TECHNIQUE: Multiplanar, multisequence MR imaging of the right shoulder was
performed following the administration of intra-articular contrast.
CONTRAST:  See Injection Documentation.

[Series 3: T1 fat-sat · axial · 4.0mm · 0.27mm/px · z∈[+37,+121]mm · 8 of 18 slices shown (1 of 4)]
[im 1/18]
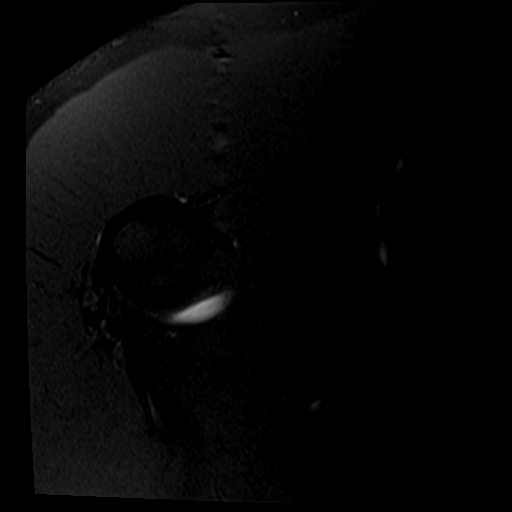
[im 3/18]
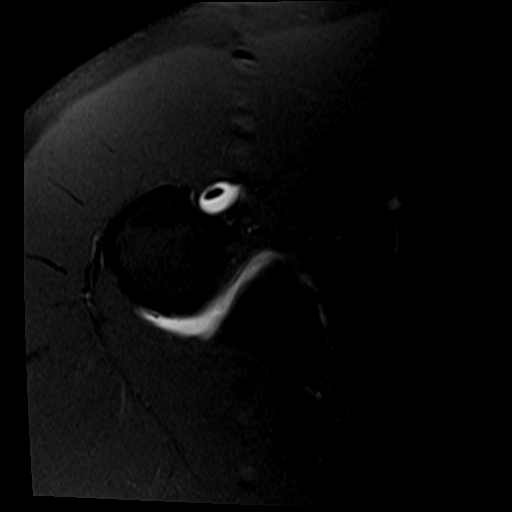
[im 5/18]
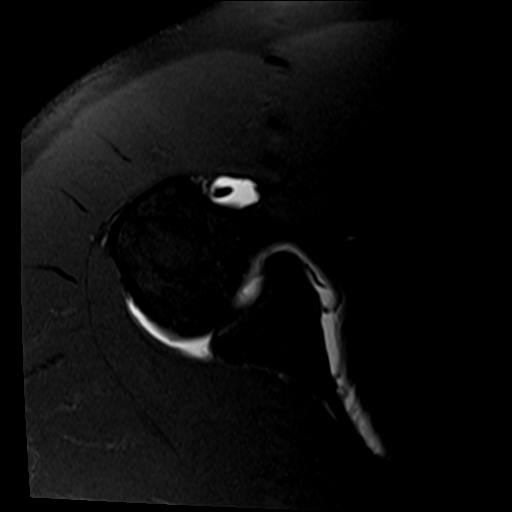
[im 8/18]
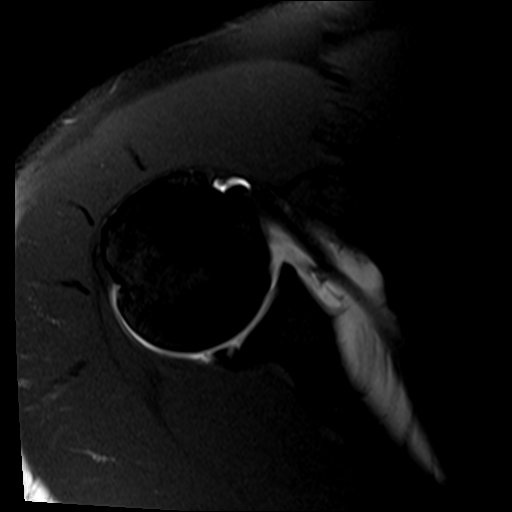
[im 10/18]
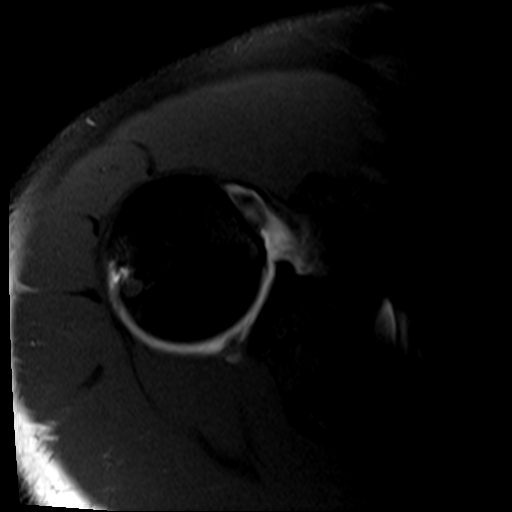
[im 13/18]
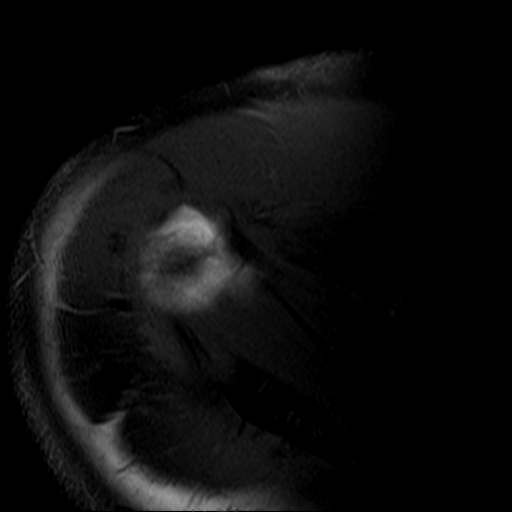
[im 15/18]
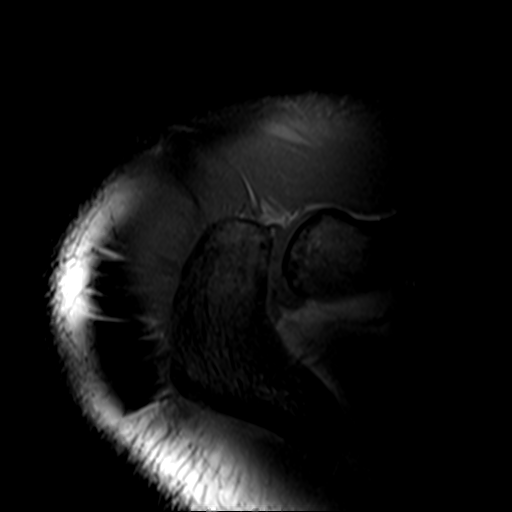
[im 18/18]
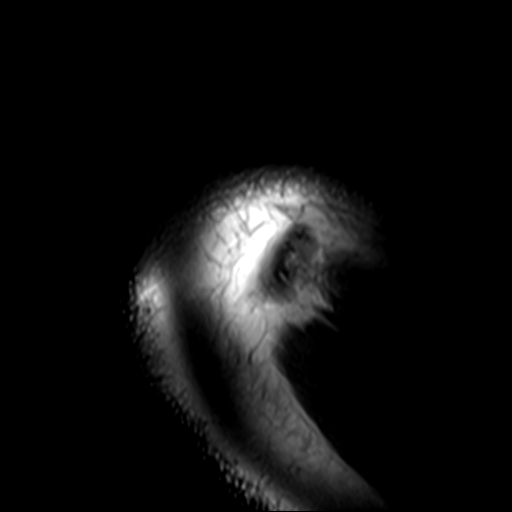

[Series 5: T1 fat-sat · oblique · 4.0mm · 0.55mm/px · 7 of 16 slices shown (2 of 4)]
[im 1/16]
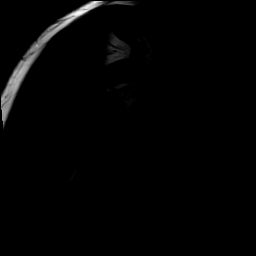
[im 3/16]
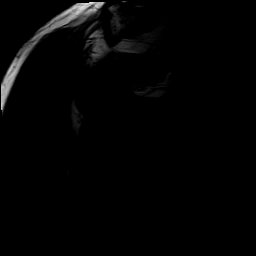
[im 6/16]
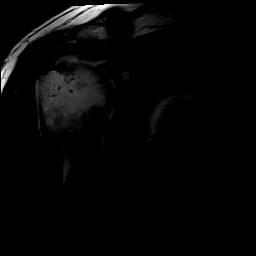
[im 8/16]
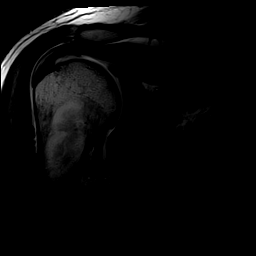
[im 11/16]
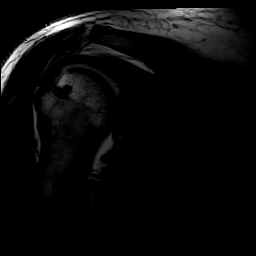
[im 13/16]
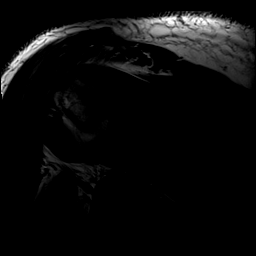
[im 16/16]
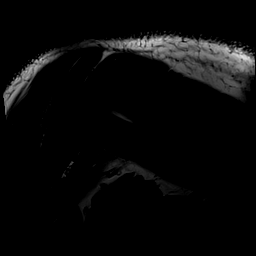

[Series 6: T1 fat-sat · oblique · 4.0mm · 0.55mm/px · 6 of 16 slices shown (3 of 4)]
[im 1/16]
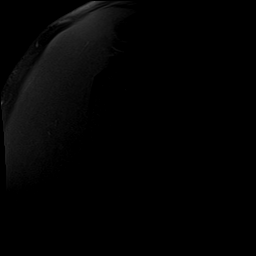
[im 4/16]
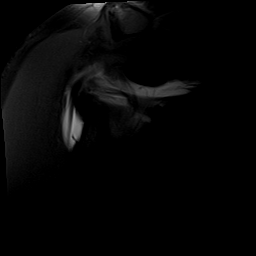
[im 7/16]
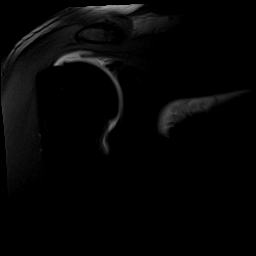
[im 10/16]
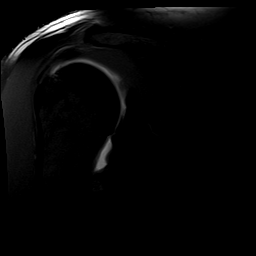
[im 13/16]
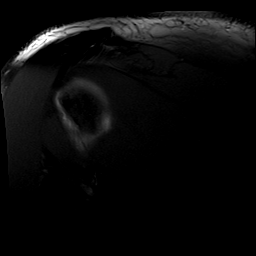
[im 16/16]
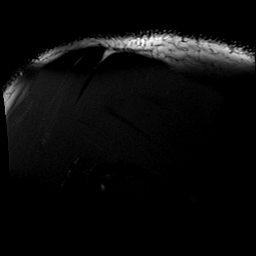

[Series 7: T2 fat-sat · oblique · 4.0mm · 0.55mm/px · 6 of 16 slices shown (1 of 2)]
[im 1/16]
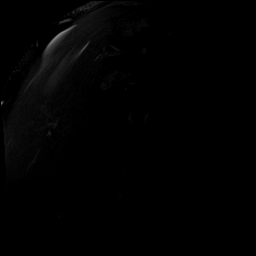
[im 4/16]
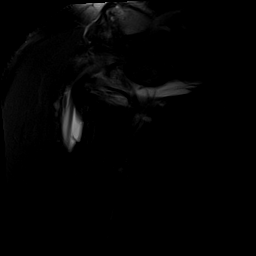
[im 7/16]
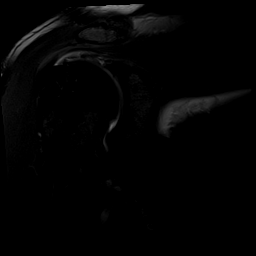
[im 10/16]
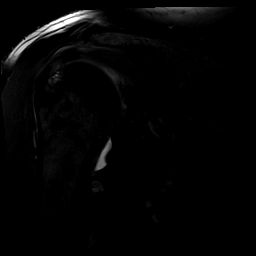
[im 13/16]
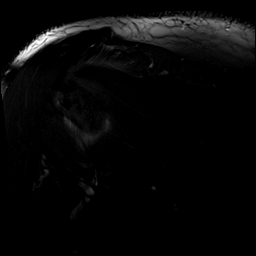
[im 16/16]
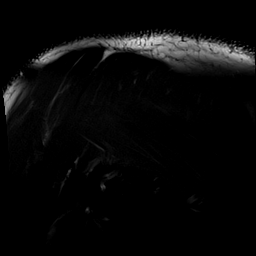

[Series 8: T2 fat-sat · oblique · 4.0mm · 0.55mm/px · 7 of 18 slices shown (2 of 2)]
[im 1/18]
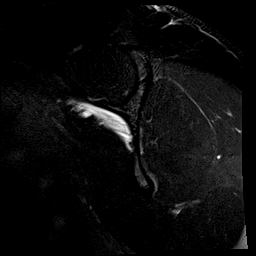
[im 3/18]
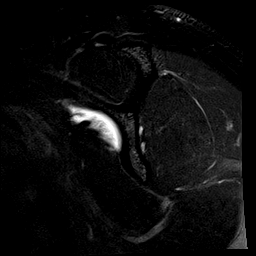
[im 6/18]
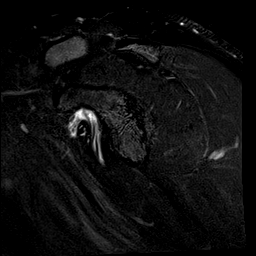
[im 9/18]
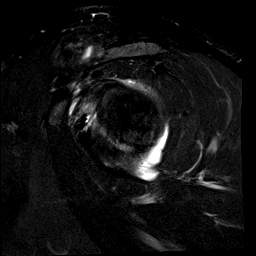
[im 12/18]
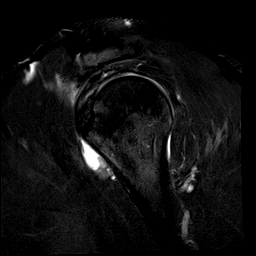
[im 15/18]
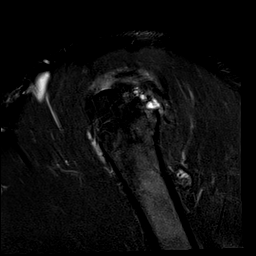
[im 18/18]
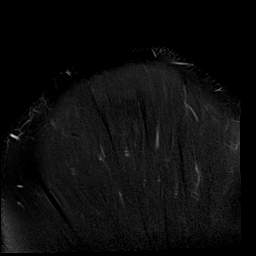

[Series 11: T1 fat-sat · sagittal · 4.0mm · 0.59mm/px · 6 of 16 slices shown (4 of 4)]
[im 1/16]
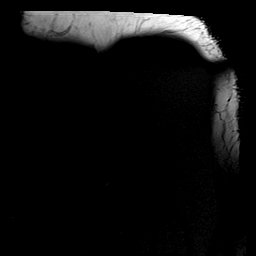
[im 4/16]
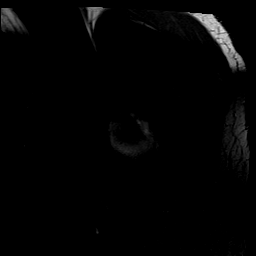
[im 7/16]
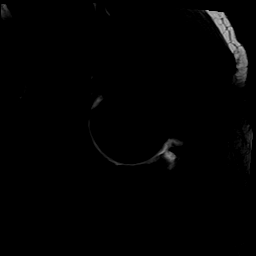
[im 10/16]
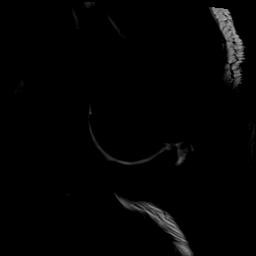
[im 13/16]
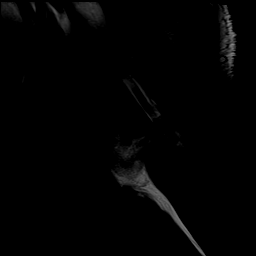
[im 16/16]
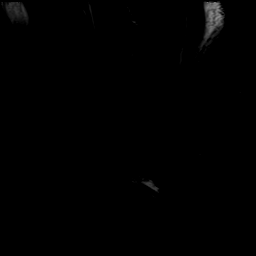

[40 of 40 positions shown; findings below may reference images not displayed]

FINDINGS: Rotator cuff: There is rotator cuff tendinopathy with an
undersurface tear of the mid-supraspinatus measuring 0.6 cm from
front to back with laminar retraction of 1-2 cm.

Muscles: Intact without atrophy or focal lesion.

Biceps long head: Contrast undermines the tendon at its attachment
to the superior labrum consistent with a small partial tear at the
biceps anchor.

Acromioclavicular Joint: Bulky degenerative change is seen.

Glenohumeral Joint: Cartilage is mildly thinned.

Labrum: There is a tear of the posterior, superior labrum and a
small paralabral cyst measuring 0.3 cm. The cyst imbibes contrast..
The superior labrum is frayed.

Bones: No fracture or worrisome lesion.
IMPRESSION: Rotator cuff tendinopathy with undersurface tear of the mid
supraspinatus measuring 0.6 cm from front to back with laminar
retraction of 1-2 cm.

Frayed superior labrum and a tear of the posterior, superior labrum
identified and an associated small paralabral cyst.

Findings compatible with a partial tear of the long head of biceps
from the superior labrum.

## 2020-06-26 IMAGING — XA DG FLUORO GUIDE NDL PLC/BX
1 series · 1 of 1 positions shown · non-contrast
Comparison: none

CLINICAL DATA: Chronic right shoulder pain. Iodinated contrast
allergy.

[Series 1: ortho adipose · 1 of 1 slices shown]
[im 1/1]
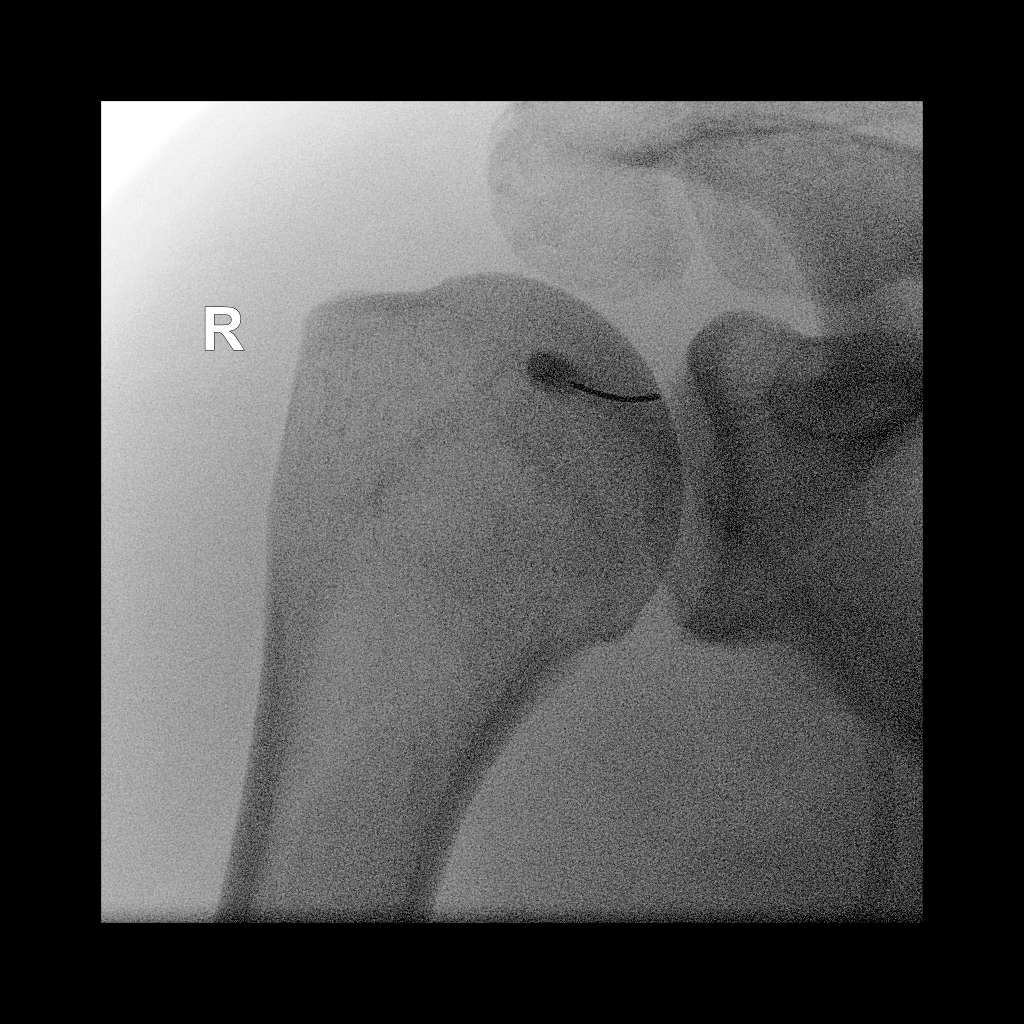

[1 of 1 positions shown; findings below may reference images not displayed]

FLUOROSCOPY TIME:  Radiation Exposure Index (as provided by the
fluoroscopic device): 0.4 mGy

Fluoroscopy Time:  2 seconds

Number of Acquired Images:  0

PROCEDURE:
The risks and benefits of the procedure were discussed with the
patient, and written informed consent was obtained. The patient
stated no history of allergy to contrast media. A formal timeout
procedure was performed with the patient according to departmental
protocol.

The patient was placed supine on the fluoroscopy table and the right
glenohumeral joint was identified under fluoroscopy. The skin
overlying the right glenohumeral joint was subsequently cleaned with
Betadine and a sterile drape was placed over the area of interest. 2
ml 1% Lidocaine was used to anesthetize the skin around the needle
insertion site.

A 3.5 inch 22 gauge spinal needle was inserted into the right
glenohumeral joint under fluoroscopy. Intra-articular positioning
was confirmed using loss of resistance with a test injection of
mL 1% lidocaine.

12 ml of gadolinium mixture (0.1 ml of Multihance mixed with 20 ml
of sterile saline) was injected into the right glenohumeral joint.

The needle was removed and hemostasis was achieved. The patient was
subsequently transferred to MRI for imaging.
IMPRESSION: Technically successful right shoulder injection for MRI.

## 2020-06-26 NOTE — Progress Notes (Signed)
Chief Complaint  Patient presents with  . Follow-up    CAD   History of Present Illness: 60 yo male with history of paroxysmal atrial fibrillation, HLD and CAD s/p 3V CABG in 2009 here today for cardiac follow up. He underwent 3V CABG in 2009 (RIMA to LAD, LIMA to OM3, SVG to RCA). Echo December 2015 with normal LV size and function, mild valve disease. He was admitted to [facility] November 2017 via EMS with c/o left shoulder pain. Code STEMI called. Emergent cardiac cath with severe diffuse disease in the SVG to PDA. A drug eluting stent was placed in the native RCA. LVEF by LV gram 55% with subtle hypokinesis of the inferior wall. Echo February 2021 with LVEF=60-65%, mild MR.   He is here today for follow up. The patient denies any chest pain, dyspnea, palpitations, lower extremity edema, orthopnea, PND, dizziness, near syncope or syncope.    Primary Care Physician: Cleatis Polka., MD  Past Medical History:  Diagnosis Date  . Anxiety   . Coronary artery disease    a. 2009: mutivessel CAD s/p CABG   b. 01/2016: inf STEMI s/p DES to RCA  . DDD (degenerative disc disease), cervical   . History of tobacco use    REMOTE  . Hyperlipidemia   . PAF (paroxysmal atrial fibrillation) (HCC)    a. brief episode in dec 2005 wtih no recurrence. No indication for [place]  . Sleep apnea    uses cpap  . Syncope and collapse     Past Surgical History:  Procedure Laterality Date  . CARDIAC CATHETERIZATION N/A 02/24/2016   Procedure: Coronary Stent Intervention;  Surgeon: Tonny Bollman, MD;  [facility]: [facility];  Service: Cardiovascular;  Laterality: N/A;  . CARDIAC CATHETERIZATION N/A 02/24/2016   Procedure: Left Heart Cath and Cors/Grafts Angiography;  Surgeon: Tonny Bollman, MD;  Location: [facility];  Service: Cardiovascular;  Laterality: N/A;  . CORONARY ARTERY BYPASS GRAFT  02/06/08   X 3  . SHOULDER ARTHROSCOPY WITH ROTATOR CUFF REPAIR AND SUBACROMIAL DECOMPRESSION Right  09/21/2018   Procedure: RIGHT SHOULDER ARTHROSCOPY WITH EXTENSIVE DEBRIDEMENT, BICEPS TENODESIS, SUBACROMIAL DECOMPRESSION,  ROTATOR CUFF REPAIR;  Surgeon: Tarry Kos, MD;  Location: [facility];  Service: Orthopedics;  Laterality: Right;  . SHOULDER ARTHROSCOPY WITH SUBACROMIAL DECOMPRESSION Left 03/27/2020   Procedure: LEFT SHOULDER ARTHROSCOPY WITH EXTENSIVE DEBRIDEMENT LABRUM AND BICEPS TENDON, SUBACROMIAL DECOMPRESSION, LYSIS OF ADHESIONS, MANIPULATION UNDER ANESTHESIA;  Surgeon: Tarry Kos, MD;  Location: [facility];  Service: Orthopedics;  Laterality: Left;   Current Outpatient Medications  Medication Instructions  . ALPRAZolam (XANAX) 0.25 mg, Oral, Daily at bedtime  . clopidogrel (PLAVIX) 75 MG tablet TAKE 1 TABLET BY MOUTH DAILY  . ezetimibe (ZETIA) 10 mg, Oral, Daily  . methocarbamol (ROBAXIN) 750 mg, Oral, 2 times daily PRN  . metoprolol tartrate (LOPRESSOR) 25 MG tablet TAKE ONE-HALF TABLET BY MOUTH TWICE A DAY GENERIC EQUIVALENT FOR LOPRESSOR  . nitroGLYCERIN (NITROSTAT) 0.4 mg, Sublingual, Every 5 min x3 PRN  . omega-3 acid ethyl esters (LOVAZA) 1 g capsule TAKE 1 CAPSULE BY MOUTH TWICE DAILY  . [name] 2 g, Topical, 2 times daily PRN  . rosuvastatin (CRESTOR) 10 mg, Oral, Daily  . testosterone cypionate (DEPOTESTOSTERONE CYPIONATE) 100 mg, Intramuscular, Every Tue  . traMADol (ULTRAM) 50 mg, Oral, Every 6 hours PRN    Allergies  Allergen Reactions  . Contrast Media [Iodinated Diagnostic Agents] Other (See Comments)  Face turned scaly (1989 CT).  Had MR arthrogram 03/07/18 (w/ CT contrast) with no prep and no problems.    Social History   Socioeconomic History  . Marital status: Married    Spouse name: Not on file  . Number of children: Not on file  . Years of education: Not on file  . Highest education level: Not on file  Occupational History  . Occupation: Firefighter  Tobacco Use  . Smoking status: Former Smoker     Packs/day: 0.50    Years: 20.00    Pack years: 10.00    Types: Cigarettes    Quit date: 01/07/2004    Years since quitting: 16.4  . Smokeless tobacco: Never Used  . Tobacco comment: Stopped 72yrs ago  Substance and Sexual Activity  . Alcohol use: Yes    Alcohol/week: 3.0 standard drinks    Types: 3 Standard drinks or equivalent per week    Comment: OCCASIONAL  . Drug use: No  . Sexual activity: Not on file  Other Topics Concern  . Not on file  Social History Narrative   MARRIED   TOBACCO USE ON AND OFF   ETOH OCCASIONALLY   USES HERBAL SUPP. ALONG WITH A DAILY VITAMIN PACK   HAD STRESS TEST APPROX 5 YRS   NEW ONSET AFIB   SYNCOPE   Social Determinants of Health   Financial Resource Strain: Not on file  Food Insecurity: Not on file  Transportation Needs: Not on file  Physical Activity: Not on file  Stress: Not on file  Social Connections: Not on file  Intimate Partner Violence: Not on file    Family History  Problem Relation Age of Onset  . Coronary artery disease Mother        ALSO ONE OF HIS UNCLES    Review of Systems:  As stated in the HPI and otherwise negative.   BP 116/70   Pulse 60   Ht 5\' 9"  (1.753 m)   Wt 205 lb (93 kg)   SpO2 96%   BMI 30.27 kg/m   Physical Examination:  General: Well developed, well nourished, NAD  HEENT: OP clear, mucus membranes moist  SKIN: warm, dry. No rashes. Neuro: No focal deficits  Musculoskeletal: Muscle strength 5/5 all ext  Psychiatric: Mood and affect normal  Neck: No JVD, no carotid bruits, no thyromegaly, no lymphadenopathy.  Lungs:Clear bilaterally, no wheezes, rhonci, crackles Cardiovascular: Regular rate and rhythm. No murmurs, gallops or rubs. Abdomen:Soft. Bowel sounds present. Non-tender.  Extremities: No lower extremity edema. Pulses are 2 + in the bilateral DP/PT.  Echo 05/25/19:  1. Left ventricular ejection fraction, by estimation, is 60 to 65%. Left  ventricular ejection fraction by 3D volume  is 61 %. The left ventricle has  normal function. The left ventricle has no regional wall motion  abnormalities. Left ventricular diastolic  parameters were normal.  2. Right ventricular systolic function is normal. The right ventricular  size is normal. There is normal pulmonary artery systolic pressure. The  estimated right ventricular systolic pressure is 25.3 mmHg.  3. The mitral valve is grossly normal. Mild mitral valve regurgitation.  No evidence of mitral stenosis.  4. The aortic valve is tricuspid. Aortic valve regurgitation is not  visualized. No aortic stenosis is present.  5. The inferior vena cava is normal in size with greater than 50%  respiratory variability, suggesting right atrial pressure of 3 mmHg.   EKG:  EKG is  not ordered today. The ekg ordered today  demonstrates   Recent Labs: No results found for requested labs within last 8760 hours.   Lipid Panel    Component Value Date/Time   CHOL 102 04/27/2016 0907   TRIG 86 04/27/2016 0907   HDL 32 (L) 04/27/2016 0907   CHOLHDL 3.2 04/27/2016 0907   CHOLHDL 4.1 02/25/2016 0515   VLDL 13 02/25/2016 0515   LDLCALC 53 04/27/2016 0907     Wt Readings from Last 3 Encounters:  06/27/20 205 lb (93 kg)  03/27/20 203 lb 4.2 oz (92.2 kg)  03/13/20 203 lb (92.1 kg)     Other studies Reviewed: Additional studies/ records that were reviewed today include: . Review of the above records demonstrates:    Assessment and Plan:   1. CAD s/p CABG without angina: He has no chest pain. He had CABG in 2009 and had an inferior STEMI in November 2017. A drug eluting stent was placed in the native RCA. Will continue Plavix, statin, Zetia and the beta blocker.    2. Paroxysmal atrial fibrillation: Remote episode but no known recurrence. Sinus today  3. HLD: LDL 65 in November 2021. Lipids followed in primary care. Will continue statin and Zetia  Current medicines are reviewed at length with the patient today.  The patient  does not have concerns regarding medicines.  The following changes have been made:  no change  Labs/ tests ordered today include:   No orders of the defined types were placed in this encounter.  Disposition:   F/U with me in 12  months   Signed, Verne Carrow, MD 06/27/2020 9:00 AM    [facility] [address] [place], [place], [place]  [address] Phone: [phone]; Fax: [phone]

## 2020-06-27 ENCOUNTER — Encounter: Payer: Self-pay | Admitting: Cardiovascular Disease

## 2020-06-27 ENCOUNTER — Other Ambulatory Visit: Payer: Self-pay

## 2020-06-27 ENCOUNTER — Ambulatory Visit (INDEPENDENT_AMBULATORY_CARE_PROVIDER_SITE_OTHER): Payer: BLUE CROSS/BLUE SHIELD | Admitting: Cardiovascular Disease

## 2020-06-27 VITALS — BP 116/70 | HR 60 | Ht 69.0 in | Wt 205.0 lb

## 2020-06-27 DIAGNOSIS — E7800 Pure hypercholesterolemia, unspecified: Secondary | ICD-10-CM

## 2020-06-27 DIAGNOSIS — I480 Paroxysmal atrial fibrillation: Secondary | ICD-10-CM

## 2020-06-27 DIAGNOSIS — I25810 Atherosclerosis of coronary artery bypass graft(s) without angina pectoris: Secondary | ICD-10-CM

## 2020-06-27 NOTE — Patient Instructions (Signed)

## 2020-07-02 ENCOUNTER — Other Ambulatory Visit: Payer: Self-pay

## 2020-07-02 ENCOUNTER — Ambulatory Visit (INDEPENDENT_AMBULATORY_CARE_PROVIDER_SITE_OTHER): Payer: 59 | Admitting: Rehabilitative and Restorative Service Providers"

## 2020-07-02 ENCOUNTER — Encounter: Payer: Self-pay | Admitting: Rehabilitative and Restorative Service Providers"

## 2020-07-02 DIAGNOSIS — R600 Localized edema: Secondary | ICD-10-CM

## 2020-07-02 DIAGNOSIS — R293 Abnormal posture: Secondary | ICD-10-CM

## 2020-07-02 DIAGNOSIS — M6281 Muscle weakness (generalized): Secondary | ICD-10-CM

## 2020-07-02 DIAGNOSIS — M25612 Stiffness of left shoulder, not elsewhere classified: Secondary | ICD-10-CM

## 2020-07-02 NOTE — Therapy (Signed)
[place], [place], [address] Phone: [phone]   Fax:  [phone]  Physical Therapy Treatment  Patient Details  Name: Jacob Gibbs MRN: [id number] Date of Birth: 16-Feb-1961 Referring Provider (PT): Leandrew Koyanagi MD   Encounter Date: 07/02/2020   PT End of Session - 07/02/20 1217    Visit Number 22    Number of Visits 36    Date for PT Re-Evaluation 06/27/20    Progress Note Due on Visit 20    PT Start Time 0805    PT Stop Time 0845    PT Time Calculation (min) 40 min    Activity Tolerance Patient tolerated treatment well    Behavior During Therapy [facility] for tasks assessed/performed           Past Medical History:  Diagnosis Date  . Anxiety   . Coronary artery disease    a. 2009: mutivessel CAD s/p CABG   b. 01/2016: inf STEMI s/p DES to RCA  . DDD (degenerative disc disease), cervical   . History of tobacco use    REMOTE  . Hyperlipidemia   . PAF (paroxysmal atrial fibrillation) (HCC)    a. brief episode in dec 2005 wtih no recurrence. No indication for [place]  . Sleep apnea    uses cpap  . Syncope and collapse     Past Surgical History:  Procedure Laterality Date  . CARDIAC CATHETERIZATION N/A 02/24/2016   Procedure: Coronary Stent Intervention;  Surgeon: Sherren Mocha, MD;  Location: [place] [facility];  Service: Cardiovascular;  Laterality: N/A;  . CARDIAC CATHETERIZATION N/A 02/24/2016   Procedure: Left Heart Cath and Cors/Grafts Angiography;  Surgeon: Sherren Mocha, MD;  Location: [place] [facility];  Service: Cardiovascular;  Laterality: N/A;  . CORONARY ARTERY BYPASS GRAFT  02/06/08   X 3  . SHOULDER ARTHROSCOPY WITH ROTATOR CUFF REPAIR AND SUBACROMIAL DECOMPRESSION Right 09/21/2018   Procedure: RIGHT SHOULDER ARTHROSCOPY WITH EXTENSIVE DEBRIDEMENT, BICEPS TENODESIS, SUBACROMIAL DECOMPRESSION,  ROTATOR CUFF REPAIR;  Surgeon: Leandrew Koyanagi, MD;  Location: [place];  Service:  Orthopedics;  Laterality: Right;  . SHOULDER ARTHROSCOPY WITH SUBACROMIAL DECOMPRESSION Left 03/27/2020   Procedure: LEFT SHOULDER ARTHROSCOPY WITH EXTENSIVE DEBRIDEMENT LABRUM AND BICEPS TENDON, SUBACROMIAL DECOMPRESSION, LYSIS OF ADHESIONS, MANIPULATION UNDER ANESTHESIA;  Surgeon: Leandrew Koyanagi, MD;  Location: [place];  Service: Orthopedics;  Laterality: Left;    There were no vitals filed for this visit.   Subjective Assessment - 07/02/20 1215    Subjective Jacob Gibbs reports he is easing back into tennis activities.  AROM appears more limiting with tennis than strength, although both will benefit from gains.    Pertinent History CABG X 3.  OA C-spine and B shoulders    Limitations Lifting    Patient Stated Goals Return to normal L shoulder activities without pain (reaching, overhead function)    Currently in Pain? No/denies    Pain Onset More than a month ago              [facility] PT Assessment - 07/02/20 0001      AROM   Left Shoulder Flexion 135 Degrees    Left Shoulder Internal Rotation 45 Degrees    Left Shoulder External Rotation 60 Degrees    Left Shoulder Horizontal ADduction 35 Degrees      Strength   Left Shoulder Internal Rotation --   34 POUNDS   Left Shoulder External Rotation --   21.5 POUNDS  [facility] Adult PT Treatment/Exercise - 07/02/20 0001      Exercises   Exercises Shoulder      Shoulder Exercises: Supine   External Rotation Left;AAROM;20 reps;Other (comment)    External Rotation Limitations 10 seconds    Internal Rotation AROM;Left;20 reps;Other (comment)    Internal Rotation Limitations 10 seconds    Flexion AAROM;Left;20 reps;Other (comment)    Flexion Limitations 10 seconds      Shoulder Exercises: Seated   Other Seated Exercises Pulley flexion and scaption with decompress (reach out first, palm facing in) 10X each 10 seconds      Shoulder Exercises: Standing   External Rotation  Strengthening;Left;10 reps;Theraband;Other (comment)    Theraband Level (Shoulder External Rotation) Level 4 (Blue)    External Rotation Limitations 2 sets    Flexion AAROM;Left;10 reps;Limitations    Flexion Limitations 10 seconds    Other Standing Exercises Posterior capsule stretch 10X 10 seconds                  PT Education - 07/02/20 1216    Education Details Reviewed HEP with emphasis on stretching and AROM (see AROM and strength numbers today).    Person(s) Educated Patient    Methods Explanation;Demonstration;Tactile cues;Verbal cues    Comprehension Returned demonstration;Need further instruction;Verbal cues required;Verbalized understanding;Tactile cues required               PT Long Term Goals - 07/02/20 1216      PT LONG TERM GOAL #1   Title Improve FOTO score to 66.    Time 12    Period Weeks    Status On-going      PT LONG TERM GOAL #2   Title Improve L shoulder AROM to 90% of the uninvolved R.    Baseline Improving, see objective    Time 12    Period Weeks    Status On-going      PT LONG TERM GOAL #3   Title Improve L shoulder pain to consistently 0-3/10 on the Numeric Pain Rating Scale.    Baseline Can be 7+/10    Time 12    Period Weeks    Status On-going      PT LONG TERM GOAL #4   Title Pt. will demonstrate Lt [place] MMT 4+/5 or greater to facilitate recreational sport return, lifting at PLOF.    Baseline ER:IR ratio 62.3% (67% Goal)    Time 12    Period Weeks    Status Partially Met      PT LONG TERM GOAL #5   Title Cormac will be independent with his long-term HEP at DC.    Time 12    Period Weeks    Status On-going                 Plan - 07/02/20 1218    Clinical Impression Statement Jacob Gibbs is returning to tennis drills and is happy with his early attempts at tennis.  He notes AROM is is biggest challenge and this is still the emphasis of his HEP.  Continue AROM and flexibility work to improve L shoulder range for tennis and  all other ADLs.    Personal Factors and Comorbidities Comorbidity 1    Comorbidities Severe shoulder OA, previous CABG X 3 and cervical OA.    Examination-Activity Limitations Bathing;Dressing;Sleep;Hygiene/Grooming;Bed Mobility;Lift;Reach Overhead;Carry    Examination-Participation Restrictions Occupation;Community Activity    Stability/Clinical Decision Making Stable/Uncomplicated    Rehab Potential Good    PT Frequency 2x /  week    PT Duration 6 weeks    PT Treatment/Interventions ADLs/Self Care Home Management;Moist Heat;Cryotherapy;Therapeutic activities;Therapeutic exercise;Neuromuscular re-education;Patient/family education;Manual techniques;Passive range of motion;Vasopneumatic Device;Joint Manipulations    PT Next Visit Plan Continued elevation strengthening, end range mobility gains.    PT Home Exercise Plan Access Code: [email or web address]    Consulted and Agree with Plan of Care Patient           Patient will benefit from skilled therapeutic intervention in order to improve the following deficits and impairments:  Decreased activity tolerance,Decreased endurance,Decreased range of motion,Decreased mobility,Decreased strength,Hypomobility,Increased edema,Impaired flexibility,Impaired UE functional use,Pain  Visit Diagnosis: Abnormal posture  Stiffness of left shoulder, not elsewhere classified  Muscle weakness (generalized)  Localized edema     Problem List Patient Active Problem List   Diagnosis Date Noted  . Adhesive capsulitis of left shoulder 03/27/2020  . Loose body in shoulder joint, left 03/27/2020  . Impingement syndrome of left shoulder 03/27/2020  . 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 Coronary artery disease   . PAF (paroxysmal atrial fibrillation) ([place])   . Acute ST elevation myocardial infarction (STEMI) of inferior wall ([place]) 02/24/2016  . HLD (hyperlipidemia) 08/19/2010    Farley Ly PT, MPT 07/02/2020, 12:20 PM  [facility] Physical Therapy [address] [place], [place], [address] Phone: [phone]   Fax:  [phone]  Name: Jacob Gibbs MRN: [id number] Date of Birth: Sep 02, 1960

## 2020-07-04 ENCOUNTER — Ambulatory Visit (INDEPENDENT_AMBULATORY_CARE_PROVIDER_SITE_OTHER): Payer: 59 | Admitting: Rehabilitative and Restorative Service Providers"

## 2020-07-04 ENCOUNTER — Encounter: Payer: Self-pay | Admitting: Rehabilitative and Restorative Service Providers"

## 2020-07-04 ENCOUNTER — Other Ambulatory Visit: Payer: Self-pay

## 2020-07-04 DIAGNOSIS — M25512 Pain in left shoulder: Secondary | ICD-10-CM

## 2020-07-04 DIAGNOSIS — G8929 Other chronic pain: Secondary | ICD-10-CM | POA: Diagnosis not present

## 2020-07-04 DIAGNOSIS — M6281 Muscle weakness (generalized): Secondary | ICD-10-CM | POA: Diagnosis not present

## 2020-07-04 DIAGNOSIS — R293 Abnormal posture: Secondary | ICD-10-CM

## 2020-07-04 DIAGNOSIS — M25612 Stiffness of left shoulder, not elsewhere classified: Secondary | ICD-10-CM | POA: Diagnosis not present

## 2020-07-04 DIAGNOSIS — R600 Localized edema: Secondary | ICD-10-CM | POA: Diagnosis not present

## 2020-07-04 NOTE — Patient Instructions (Signed)
Access Code: [email or web address] URL: [email or web address] Date: 07/04/2020 Prepared by: Chyrel Masson  Exercises Supine Shoulder Internal Rotation Stretch - 2-3 x daily - 7 x weekly - 1 sets - 10-20 reps - 10 secondsinutes hold Supine Shoulder External Rotation Stretch - 2-3 x daily - 7 x weekly - 1 sets - 10-20 reps - 10 seconds hold Standing shoulder flexion wall slides - 2 x daily - 7 x weekly - 1 sets - 10-15 reps - 5 hold Shoulder External Rotation with Anchored Resistance - 2 x daily - 7 x weekly - 3 sets - 15 reps Sleeper Stretch - 2 x daily - 7 x weekly - 1 sets - 5 reps - 30 hold Standing Shoulder Internal Rotation Stretch with Towel - 2 x daily - 7 x weekly - 1 sets - 5 reps - 30 hold Standing Shoulder Flexion to 90 Degrees with Dumbbells - 1 x daily - 7 x weekly - 3 sets - 10 reps Shoulder Abduction with Dumbbells - Thumbs Up - 1 x daily - 7 x weekly - 3 sets - 10 reps Single Arm Doorway Pec Stretch at 90 Degrees Abduction - 2 x daily - 7 x weekly - 1 sets - 5 reps - 30 hold

## 2020-07-04 NOTE — Therapy (Addendum)
[place], [place], [address] Phone: [phone]   Fax:  [phone]  Physical Therapy Treatment/Recertification/Discharge  Patient Details  Name: Jacob Gibbs MRN: [id number] Date of Birth: October 25, 1960 Referring Provider (PT): Leandrew Koyanagi MD   Encounter Date: 07/04/2020   Progress Note Reporting Period 06/18/2020 to 07/04/2020  See note below for Objective Data and Assessment of Progress/Goals.        PT End of Session - 07/04/20 0810    Visit Number 23    Number of Visits 30    Date for PT Re-Evaluation 07/27/20    Progress Note Due on Visit 30    PT Start Time 0805    PT Stop Time 0844    PT Time Calculation (min) 39 min    Activity Tolerance Patient tolerated treatment well    Behavior During Therapy [facility] for tasks assessed/performed           Past Medical History:  Diagnosis Date  . Anxiety   . Coronary artery disease    a. 2009: mutivessel CAD s/p CABG   b. 01/2016: inf STEMI s/p DES to RCA  . DDD (degenerative disc disease), cervical   . History of tobacco use    REMOTE  . Hyperlipidemia   . PAF (paroxysmal atrial fibrillation) (HCC)    a. brief episode in dec 2005 wtih no recurrence. No indication for [place]  . Sleep apnea    uses cpap  . Syncope and collapse     Past Surgical History:  Procedure Laterality Date  . CARDIAC CATHETERIZATION N/A 02/24/2016   Procedure: Coronary Stent Intervention;  Surgeon: Sherren Mocha, MD;  Location: [place] [facility];  Service: Cardiovascular;  Laterality: N/A;  . CARDIAC CATHETERIZATION N/A 02/24/2016   Procedure: Left Heart Cath and Cors/Grafts Angiography;  Surgeon: Sherren Mocha, MD;  Location: [place] [facility];  Service: Cardiovascular;  Laterality: N/A;  . CORONARY ARTERY BYPASS GRAFT  02/06/08   X 3  . SHOULDER ARTHROSCOPY WITH ROTATOR CUFF REPAIR AND SUBACROMIAL DECOMPRESSION Right 09/21/2018   Procedure: RIGHT SHOULDER ARTHROSCOPY WITH EXTENSIVE  DEBRIDEMENT, BICEPS TENODESIS, SUBACROMIAL DECOMPRESSION,  ROTATOR CUFF REPAIR;  Surgeon: Leandrew Koyanagi, MD;  Location: [place];  Service: Orthopedics;  Laterality: Right;  . SHOULDER ARTHROSCOPY WITH SUBACROMIAL DECOMPRESSION Left 03/27/2020   Procedure: LEFT SHOULDER ARTHROSCOPY WITH EXTENSIVE DEBRIDEMENT LABRUM AND BICEPS TENDON, SUBACROMIAL DECOMPRESSION, LYSIS OF ADHESIONS, MANIPULATION UNDER ANESTHESIA;  Surgeon: Leandrew Koyanagi, MD;  Location: [place];  Service: Orthopedics;  Laterality: Left;    There were no vitals filed for this visit.   Subjective Assessment - 07/04/20 0809    Subjective Pt. indicated no specific pain upon arrival today.  Pt. indicated doing better and better with tennis stuff c less reliance on any medicine.    Pertinent History CABG X 3.  OA C-spine and B shoulders    Limitations Lifting    Patient Stated Goals Return to normal L shoulder activities without pain (reaching, overhead function)    Currently in Pain? No/denies    Pain Onset More than a month ago              [facility] PT Assessment - 07/04/20 0001      Assessment   Medical Diagnosis s/p Lt shoulder arthroscopy/adhesive capsulitis    Referring Provider (PT) Georga Kaufmann Ephriam Jenkins MD    Onset Date/Surgical Date 03/27/20    Hand Dominance Right      Observation/Other  Assessments   Focus on Therapeutic Outcomes (FOTO)  65      AROM   Left Shoulder Flexion 135 Degrees   measured supine   Left Shoulder ABduction 110 Degrees   measured supine     Strength   Left Shoulder Flexion 4+/5   14.6, 16.4   Left Shoulder ABduction 4/5   14.9, 10.5   Left Shoulder Internal Rotation 5/5    Left Shoulder External Rotation 5/5                         [facility] Adult PT Treatment/Exercise - 07/04/20 0001      Shoulder Exercises: Supine   External Rotation Other (comment)   HEP   Internal Rotation Other (comment)   HEP     Shoulder Exercises: Seated   Other Seated  Exercises HEP use      Shoulder Exercises: Sidelying   ABduction Both   2 x 15 movement to 100 deg   ABduction Weight (lbs) 2      Shoulder Exercises: Standing   External Rotation Strengthening   er c 90 deg flexion punch x20   Theraband Level (Shoulder External Rotation) Level 3 (Green)    Other Standing Exercises body blade er/ir 30 sec x 3 in neutral, abd in 20 deg 30 sec x 2 on Lt UE      Shoulder Exercises: ROM/Strengthening   UBE (Upper Arm Bike) Lvl 4 4 mins fwd, 3 mins backward      Shoulder Exercises: Stretch   Other Shoulder Stretches ER doorway 90/90 stretch 30 sec x 3 Lt UE, posterior capsule cross arm stretch 30 sec x 3 Lt UE      Manual Therapy   Manual therapy comments g4 inferior jt mobs in flexion, abduction, scaption.  IR MET contract/relax in 90 deg abd supine                  PT Education - 07/04/20 0904    Education Details Discussion c Pt. on importance of working intervention for ROM gains as also for strengthening gains.  Research discussion about collagen fibers responding to longer load stretching > 30 seconds for improved mobility)    Person(s) Educated Patient    Methods Explanation    Comprehension Verbalized understanding               PT Long Term Goals - 07/04/20 0845      PT LONG TERM GOAL #1   Title Improve FOTO score to 66.    Baseline 65 update 07/04/2020    Time 4    Period Weeks    Status On-going    Target Date 07/27/20      PT LONG TERM GOAL #2   Title Improve Lt shoulder AROM to 90% of the uninvolved Rt.    Baseline --    Time 4    Period Weeks    Status On-going    Target Date 07/27/20      PT LONG TERM GOAL #3   Title Improve L shoulder pain to consistently 0-3/10 on the Numeric Pain Rating Scale.    Baseline 07/27/2020    Time 4    Period Weeks    Status On-going    Target Date 07/27/20      PT LONG TERM GOAL #4   Title Pt. will demonstrate Lt Jacob Gibbs 4+/5 or greater to facilitate recreational sport return,  lifting at PLOF.    Baseline --  Time 4    Period Weeks    Status On-going    Target Date 07/27/20      PT LONG TERM GOAL #5   Title Jacob Gibbs will be independent with his long-term HEP at [place].    Time 4    Period Weeks    Status On-going    Target Date 07/27/20                 Plan - 07/04/20 0835    Clinical Impression Statement Pt. has attended 23 visits overall during course of treatment, reporting continued steady overall reduction in symptoms and improved function to this point.  See objective data for updated information showing remaining Lt [place] AROM impairment/strength impairment that can limit full return to PLOF at this time.  Pt. is appropriate for continued skilled PT services c transitioning to HEP for long term progression.    Personal Factors and Comorbidities Comorbidity 1    Comorbidities Severe shoulder OA, previous CABG X 3 and cervical OA.    Examination-Activity Limitations Bathing;Dressing;Sleep;Hygiene/Grooming;Bed Mobility;Lift;Reach Overhead;Carry    Examination-Participation Restrictions Occupation;Community Activity    Stability/Clinical Decision Making Stable/Uncomplicated    Rehab Potential Good    PT Frequency 1x / week    PT Duration 4 weeks    PT Treatment/Interventions ADLs/Self Care Home Management;Moist Heat;Cryotherapy;Therapeutic activities;Therapeutic exercise;Neuromuscular re-education;Patient/family education;Manual techniques;Passive range of motion;Vasopneumatic Device;Joint Manipulations    PT Next Visit Plan Recommend transitioning to HEP over next few weeks, reduced frequency of visits.    PT Home Exercise Plan Access Code: [email or web address]    Consulted and Agree with Plan of Care Patient           Patient will benefit from skilled therapeutic intervention in order to improve the following deficits and impairments:  Decreased activity tolerance,Decreased endurance,Decreased range of motion,Decreased mobility,Decreased  strength,Hypomobility,Increased edema,Impaired flexibility,Impaired UE functional use,Pain  Visit Diagnosis: Chronic left shoulder pain  Stiffness of left shoulder, not elsewhere classified  Localized edema  Muscle weakness (generalized)  Abnormal posture     Problem List Patient Active Problem List   Diagnosis Date Noted  . Adhesive capsulitis of left shoulder 03/27/2020  . Loose body in shoulder joint, left 03/27/2020  . Impingement syndrome of left shoulder 03/27/2020  . 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 Coronary artery disease   . PAF (paroxysmal atrial fibrillation) ([place])   . Acute ST elevation myocardial infarction (STEMI) of inferior wall ([place]) 02/24/2016  . HLD (hyperlipidemia) 08/19/2010    Scot Jun, PT, DPT, OCS, ATC 07/04/20  9:10 AM   PHYSICAL THERAPY DISCHARGE SUMMARY  Visits from Start of Care: 23  Current functional level related to goals / functional outcomes: See note   Remaining deficits:see note   Education / Equipment: HEP Plan: Patient agrees to discharge.  Patient goals were partially met. Patient is being discharged due to being pleased with the current functional level.  ?????    Scot Jun, PT, DPT, OCS, ATC 07/25/20  8:25 AM        [facility] Physical Therapy [address] [place], [place], [address] Phone: [phone]   Fax:  [phone]  Name: BASSEL GASKILL MRN: [id number] Date of Birth: 30-Aug-1960

## 2020-07-09 ENCOUNTER — Encounter: Payer: BLUE CROSS/BLUE SHIELD | Admitting: Rehabilitative and Restorative Service Providers"

## 2020-07-11 ENCOUNTER — Encounter: Payer: 59 | Admitting: Rehabilitative and Restorative Service Providers"

## 2020-07-16 ENCOUNTER — Encounter: Payer: BLUE CROSS/BLUE SHIELD | Admitting: Rehabilitative and Restorative Service Providers"

## 2020-07-18 ENCOUNTER — Encounter: Payer: 59 | Admitting: Rehabilitative and Restorative Service Providers"

## 2020-07-23 ENCOUNTER — Encounter: Payer: BLUE CROSS/BLUE SHIELD | Admitting: Rehabilitative and Restorative Service Providers"

## 2020-07-25 ENCOUNTER — Encounter: Payer: 59 | Admitting: Rehabilitative and Restorative Service Providers"

## 2020-07-25 ENCOUNTER — Telehealth: Payer: Self-pay | Admitting: Rehabilitative and Restorative Service Providers"

## 2020-07-25 NOTE — Telephone Encounter (Signed)
Called and left message about missed appointment.  No more scheduled at this time.   Chyrel Masson, PT, DPT, OCS, ATC 07/25/20  8:20 AM

## 2020-08-21 ENCOUNTER — Other Ambulatory Visit: Payer: Self-pay

## 2020-08-21 DIAGNOSIS — Z125 Encounter for screening for malignant neoplasm of prostate: Secondary | ICD-10-CM | POA: Diagnosis not present

## 2020-08-21 MED ORDER — ROSUVASTATIN CALCIUM 20 MG PO TABS: 10.0000 mg | ORAL_TABLET | Freq: Every day | ORAL | 3 refills | Status: DC

## 2020-10-30 DIAGNOSIS — Z125 Encounter for screening for malignant neoplasm of prostate: Secondary | ICD-10-CM | POA: Diagnosis not present

## 2020-10-30 DIAGNOSIS — E291 Testicular hypofunction: Secondary | ICD-10-CM | POA: Diagnosis not present

## 2020-10-30 DIAGNOSIS — N5201 Erectile dysfunction due to arterial insufficiency: Secondary | ICD-10-CM | POA: Diagnosis not present

## 2020-10-30 DIAGNOSIS — R972 Elevated prostate specific antigen [PSA]: Secondary | ICD-10-CM | POA: Diagnosis not present

## 2021-02-03 DIAGNOSIS — I2510 Atherosclerotic heart disease of native coronary artery without angina pectoris: Secondary | ICD-10-CM | POA: Diagnosis not present

## 2021-02-03 DIAGNOSIS — R0789 Other chest pain: Secondary | ICD-10-CM | POA: Diagnosis not present

## 2021-02-03 DIAGNOSIS — G4733 Obstructive sleep apnea (adult) (pediatric): Secondary | ICD-10-CM | POA: Diagnosis not present

## 2021-02-03 DIAGNOSIS — K219 Gastro-esophageal reflux disease without esophagitis: Secondary | ICD-10-CM | POA: Diagnosis not present

## 2021-03-25 ENCOUNTER — Other Ambulatory Visit: Payer: Self-pay

## 2021-03-25 MED ORDER — METOPROLOL TARTRATE 25 MG PO TABS: ORAL_TABLET | ORAL | 0 refills | Status: DC

## 2021-04-02 DIAGNOSIS — Z125 Encounter for screening for malignant neoplasm of prostate: Secondary | ICD-10-CM | POA: Diagnosis not present

## 2021-04-02 DIAGNOSIS — E291 Testicular hypofunction: Secondary | ICD-10-CM | POA: Diagnosis not present

## 2021-04-02 DIAGNOSIS — I2510 Atherosclerotic heart disease of native coronary artery without angina pectoris: Secondary | ICD-10-CM | POA: Diagnosis not present

## 2021-04-02 DIAGNOSIS — R7301 Impaired fasting glucose: Secondary | ICD-10-CM | POA: Diagnosis not present

## 2021-04-02 DIAGNOSIS — E785 Hyperlipidemia, unspecified: Secondary | ICD-10-CM | POA: Diagnosis not present

## 2021-04-09 DIAGNOSIS — R82998 Other abnormal findings in urine: Secondary | ICD-10-CM | POA: Diagnosis not present

## 2021-04-21 ENCOUNTER — Other Ambulatory Visit: Payer: Self-pay | Admitting: *Deleted

## 2021-04-21 MED ORDER — OMEGA-3-ACID ETHYL ESTERS 1 G PO CAPS: 1.0000 | ORAL_CAPSULE | Freq: Two times a day (BID) | ORAL | 0 refills | Status: DC

## 2021-04-22 ENCOUNTER — Other Ambulatory Visit: Payer: Self-pay

## 2021-04-22 MED ORDER — EZETIMIBE 10 MG PO TABS: 10.0000 mg | ORAL_TABLET | Freq: Every day | ORAL | 0 refills | Status: DC

## 2021-04-28 DIAGNOSIS — L0889 Other specified local infections of the skin and subcutaneous tissue: Secondary | ICD-10-CM | POA: Diagnosis not present

## 2021-04-28 DIAGNOSIS — E785 Hyperlipidemia, unspecified: Secondary | ICD-10-CM | POA: Diagnosis not present

## 2021-06-04 DIAGNOSIS — E291 Testicular hypofunction: Secondary | ICD-10-CM | POA: Diagnosis not present

## 2021-06-04 DIAGNOSIS — N5201 Erectile dysfunction due to arterial insufficiency: Secondary | ICD-10-CM | POA: Diagnosis not present

## 2021-06-04 DIAGNOSIS — R972 Elevated prostate specific antigen [PSA]: Secondary | ICD-10-CM | POA: Diagnosis not present

## 2021-06-16 ENCOUNTER — Other Ambulatory Visit: Payer: Self-pay

## 2021-06-16 MED ORDER — METOPROLOL TARTRATE 25 MG PO TABS: ORAL_TABLET | ORAL | 0 refills | Status: DC

## 2021-06-25 ENCOUNTER — Other Ambulatory Visit: Payer: Self-pay

## 2021-06-30 ENCOUNTER — Other Ambulatory Visit: Payer: Self-pay | Admitting: *Deleted

## 2021-06-30 MED ORDER — METOPROLOL TARTRATE 25 MG PO TABS: ORAL_TABLET | ORAL | 0 refills | Status: DC

## 2021-07-01 ENCOUNTER — Other Ambulatory Visit: Payer: Self-pay

## 2021-07-01 MED ORDER — CLOPIDOGREL BISULFATE 75 MG PO TABS: 75.0000 mg | ORAL_TABLET | Freq: Every day | ORAL | 0 refills | Status: DC

## 2021-07-07 ENCOUNTER — Other Ambulatory Visit: Payer: Self-pay | Admitting: *Deleted

## 2021-07-07 MED ORDER — CLOPIDOGREL BISULFATE 75 MG PO TABS: 75.0000 mg | ORAL_TABLET | Freq: Every day | ORAL | 0 refills | Status: DC

## 2021-07-18 ENCOUNTER — Other Ambulatory Visit: Payer: Self-pay | Admitting: *Deleted

## 2021-07-18 ENCOUNTER — Other Ambulatory Visit: Payer: Self-pay

## 2021-07-18 MED ORDER — EZETIMIBE 10 MG PO TABS: 10.0000 mg | ORAL_TABLET | Freq: Every day | ORAL | 0 refills | Status: DC

## 2021-07-18 MED ORDER — ROSUVASTATIN CALCIUM 20 MG PO TABS: 10.0000 mg | ORAL_TABLET | Freq: Every day | ORAL | 1 refills | Status: DC

## 2021-07-21 ENCOUNTER — Other Ambulatory Visit: Payer: Self-pay | Admitting: *Deleted

## 2021-07-21 MED ORDER — OMEGA-3-ACID ETHYL ESTERS 1 G PO CAPS
1.0000 | ORAL_CAPSULE | Freq: Two times a day (BID) | ORAL | 1 refills | Status: DC
Start: 2021-07-21 — End: 2022-03-31

## 2021-07-23 ENCOUNTER — Other Ambulatory Visit: Payer: Self-pay

## 2021-07-23 MED ORDER — CLOPIDOGREL BISULFATE 75 MG PO TABS: 75.0000 mg | ORAL_TABLET | Freq: Every day | ORAL | 0 refills | Status: DC

## 2021-08-01 ENCOUNTER — Encounter: Payer: Self-pay | Admitting: Cardiovascular Disease

## 2021-08-01 ENCOUNTER — Ambulatory Visit: Payer: 59 | Admitting: Cardiovascular Disease

## 2021-08-01 VITALS — BP 124/70 | HR 60 | Ht 69.0 in | Wt 205.4 lb

## 2021-08-01 DIAGNOSIS — I480 Paroxysmal atrial fibrillation: Secondary | ICD-10-CM | POA: Diagnosis not present

## 2021-08-01 DIAGNOSIS — I25810 Atherosclerosis of coronary artery bypass graft(s) without angina pectoris: Secondary | ICD-10-CM | POA: Diagnosis not present

## 2021-08-01 DIAGNOSIS — E7800 Pure hypercholesterolemia, unspecified: Secondary | ICD-10-CM | POA: Diagnosis not present

## 2021-08-01 MED ORDER — METOPROLOL TARTRATE 25 MG PO TABS: ORAL_TABLET | ORAL | 3 refills | Status: DC

## 2021-08-01 MED ORDER — EZETIMIBE 10 MG PO TABS: 10.0000 mg | ORAL_TABLET | Freq: Every day | ORAL | 3 refills | Status: DC

## 2021-08-01 MED ORDER — ROSUVASTATIN CALCIUM 20 MG PO TABS
10.0000 mg | ORAL_TABLET | Freq: Every day | ORAL | 3 refills | Status: DC
Start: 2021-08-01 — End: 2022-08-20

## 2021-08-01 NOTE — Progress Notes (Signed)
? ?Chief Complaint  ?Patient presents with  ? Follow-up  ?  CAD  ? ?History of Present Illness: 61 yo male with history of paroxysmal atrial fibrillation, HLD and CAD s/p 3V CABG in 2009 here today for cardiac follow up. He underwent 3V CABG in 2009 (RIMA to LAD, LIMA to OM3, SVG to RCA). Echo December 2015 with normal LV size and function, mild valve disease. He was admitted to [facility] November 2017 via EMS with c/o left shoulder pain. Code STEMI called. Emergent cardiac cath with severe diffuse disease in the SVG to PDA. A drug eluting stent was placed in the native RCA. LVEF by LV gram 55% with subtle hypokinesis of the inferior wall. Echo February 2021 with LVEF=60-65%, mild MR.  ? ?He is here today for follow up. The patient denies any chest pain, dyspnea, palpitations, lower extremity edema, orthopnea, PND, dizziness, near syncope or syncope.  ? ?  ?Primary Care Physician: Cleatis Polka., MD ? ?Past Medical History:  ?Diagnosis Date  ? Anxiety   ? Coronary artery disease   ? a. 2009: mutivessel CAD s/p CABG   b. 01/2016: inf STEMI s/p DES to RCA  ? DDD (degenerative disc disease), cervical   ? History of tobacco use   ? REMOTE  ? Hyperlipidemia   ? PAF (paroxysmal atrial fibrillation) (HCC)   ? a. brief episode in dec 2005 wtih no recurrence. No indication for OAC  ? Sleep apnea   ? uses cpap  ? Syncope and collapse   ? ? ?Past Surgical History:  ?Procedure Laterality Date  ? CARDIAC CATHETERIZATION N/A 02/24/2016  ? Procedure: Coronary Stent Intervention;  Surgeon: Tonny Bollman, MD;  Location: [facility] INVASIVE CV LAB;  Service: Cardiovascular;  Laterality: N/A;  ? CARDIAC CATHETERIZATION N/A 02/24/2016  ? Procedure: Left Heart Cath and Cors/Grafts Angiography;  Surgeon: Tonny Bollman, MD;  Location: [facility] INVASIVE CV LAB;  Service: Cardiovascular;  Laterality: N/A;  ? CORONARY ARTERY BYPASS GRAFT  02/06/08  ? X 3  ? SHOULDER ARTHROSCOPY WITH ROTATOR CUFF REPAIR AND SUBACROMIAL DECOMPRESSION Right 09/21/2018  ?  Procedure: RIGHT SHOULDER ARTHROSCOPY WITH EXTENSIVE DEBRIDEMENT, BICEPS TENODESIS, SUBACROMIAL DECOMPRESSION,  ROTATOR CUFF REPAIR;  Surgeon: Tarry Kos, MD;  Location: [facility];  Service: Orthopedics;  Laterality: Right;  ? SHOULDER ARTHROSCOPY WITH SUBACROMIAL DECOMPRESSION Left 03/27/2020  ? Procedure: LEFT SHOULDER ARTHROSCOPY WITH EXTENSIVE DEBRIDEMENT LABRUM AND BICEPS TENDON, SUBACROMIAL DECOMPRESSION, LYSIS OF ADHESIONS, MANIPULATION UNDER ANESTHESIA;  Surgeon: Tarry Kos, MD;  Location: [facility];  Service: Orthopedics;  Laterality: Left;  ? ?Current Outpatient Medications  ?Medication Instructions  ? clopidogrel (PLAVIX) 75 mg, Oral, Daily, Keep 10/2021 appointment to receive further refills. Thank you.  ? ezetimibe (ZETIA) 10 mg, Oral, Daily  ? methocarbamol (ROBAXIN) 750 mg, Oral, 2 times daily PRN  ? metoprolol tartrate (LOPRESSOR) 25 MG tablet TAKE ONE-HALF TABLET BY MOUTH TWICE A DAY GENERIC EQUIVALENT FOR LOPRESSOR. t  ? nitroGLYCERIN (NITROSTAT) 0.4 mg, Sublingual, Every 5 min x3 PRN  ? omega-3 acid ethyl esters (LOVAZA) 1 g, Oral, 2 times daily  ? [name] 2 g, Topical, 2 times daily PRN  ? rosuvastatin (CRESTOR) 10 mg, Oral, Daily  ? testosterone cypionate (DEPOTESTOSTERONE CYPIONATE) 100 mg, Intramuscular, Every Tue  ? traMADol (ULTRAM) 50 mg, Oral, Every 6 hours PRN  ? ? ?Allergies  ?Allergen Reactions  ? Contrast Media [Iodinated Contrast Media] Other (See Comments)  ?  Face turned scaly (1989 CT).  Had MR  arthrogram 03/07/18 (w/ CT contrast) with no prep and no problems.  ? ? ?Social History  ? ?Socioeconomic History  ? Marital status: Married  ?  Spouse name: Not on file  ? Number of children: Not on file  ? Years of education: Not on file  ? Highest education level: Not on file  ?Occupational History  ? Occupation: Firefighterinancial advisor  ?Tobacco Use  ? Smoking status: Former  ?  Packs/day: 0.50  ?  Years: 20.00  ?  Pack years: 10.00  ?  Types: Cigarettes  ?   Quit date: 01/07/2004  ?  Years since quitting: 17.5  ? Smokeless tobacco: Never  ? Tobacco comments:  ?  Stopped 1357yrs ago  ?Substance and Sexual Activity  ? Alcohol use: Yes  ?  Alcohol/week: 3.0 standard drinks  ?  Types: 3 Standard drinks or equivalent per week  ?  Comment: OCCASIONAL  ? Drug use: No  ? Sexual activity: Not on file  ?Other Topics Concern  ? Not on file  ?Social History Narrative  ? MARRIED  ? TOBACCO USE ON AND OFF  ? ETOH OCCASIONALLY  ? USES HERBAL SUPP. ALONG WITH A DAILY VITAMIN PACK  ? HAD STRESS TEST APPROX 5 YRS  ? NEW ONSET AFIB  ? SYNCOPE  ? ?Social Determinants of Health  ? ?Financial Resource Strain: Not on file  ?Food Insecurity: Not on file  ?Transportation Needs: Not on file  ?Physical Activity: Not on file  ?Stress: Not on file  ?Social Connections: Not on file  ?Intimate Partner Violence: Not on file  ? ? ?Family History  ?Problem Relation Age of Onset  ? Coronary artery disease Mother   ?     ALSO ONE OF HIS UNCLES  ? ? ?Review of Systems:  As stated in the HPI and otherwise negative.  ? ?BP 124/70   Pulse 60   Ht 5\' 9"  (1.753 m)   Wt 205 lb 6.4 oz (93.2 kg)   SpO2 98%   BMI 30.33 kg/m?  ? ?Physical Examination: ? ?General: Well developed, well nourished, NAD  ?HEENT: OP clear, mucus membranes moist  ?SKIN: warm, dry. No rashes. ?Neuro: No focal deficits  ?Musculoskeletal: Muscle strength 5/5 all ext  ?Psychiatric: Mood and affect normal  ?Neck: No JVD, no carotid bruits, no thyromegaly, no lymphadenopathy.  ?Lungs:Clear bilaterally, no wheezes, rhonci, crackles ?Cardiovascular: Regular rate and rhythm. No murmurs, gallops or rubs. ?Abdomen:Soft. Bowel sounds present. Non-tender.  ?Extremities: No lower extremity edema. Pulses are 2 + in the bilateral DP/PT. ? ?Echo 05/25/19: ? ? 1. Left ventricular ejection fraction, by estimation, is 60 to 65%. Left  ?ventricular ejection fraction by 3D volume is 61 %. The left ventricle has  ?normal function. The left ventricle has no  regional wall motion  ?abnormalities. Left ventricular diastolic  ? parameters were normal.  ? 2. Right ventricular systolic function is normal. The right ventricular  ?size is normal. There is normal pulmonary artery systolic pressure. The  ?estimated right ventricular systolic pressure is 25.3 mmHg.  ? 3. The mitral valve is grossly normal. Mild mitral valve regurgitation.  ?No evidence of mitral stenosis.  ? 4. The aortic valve is tricuspid. Aortic valve regurgitation is not  ?visualized. No aortic stenosis is present.  ? 5. The inferior vena cava is normal in size with greater than 50%  ?respiratory variability, suggesting right atrial pressure of 3 mmHg.  ? ?EKG:  EKG is ordered today. ?The ekg ordered today demonstrates  NSR, incomplete RBBB ? ?Recent Labs: ?No results found for requested labs within last 8760 hours.  ? ?Lipid Panel ?   ?Component Value Date/Time  ? CHOL 102 04/27/2016 0907  ? TRIG 86 04/27/2016 0907  ? HDL 32 (L) 04/27/2016 0254  ? CHOLHDL 3.2 04/27/2016 0907  ? CHOLHDL 4.1 02/25/2016 0515  ? VLDL 13 02/25/2016 0515  ? LDLCALC 53 04/27/2016 0907  ? ?  ?Wt Readings from Last 3 Encounters:  ?08/01/21 205 lb 6.4 oz (93.2 kg)  ?06/27/20 205 lb (93 kg)  ?03/27/20 203 lb 4.2 oz (92.2 kg)  ?  ? ?Other studies Reviewed: ?Additional studies/ records that were reviewed today include: . ?Review of the above records demonstrates:  ? ? ?Assessment and Plan:  ? ?1. CAD s/p CABG without angina: He had CABG in 2009 and had an inferior STEMI in November 2017. A drug eluting stent was placed in the native RCA. He is doing well with no chest pain. Continue Plavix, beta blocker, statin and Zetia.    ? ?2. Paroxysmal atrial fibrillation: Remote episode but no known recurrence. Sinus today ? ?3. HLD: LDL 69 in January 2023 . Lipids followed in primary care. Will continue statin and Zetia ? ?Current medicines are reviewed at length with the patient today.  The patient does not have concerns regarding  medicines. ? ?The following changes have been made:  no change ? ?Labs/ tests ordered today include:  ? ?Orders Placed This Encounter  ?Procedures  ? EKG 12-Lead  ? ?Disposition:   F/U with me in 12  months ? ? ?Signed, ?Ch

## 2021-08-01 NOTE — Patient Instructions (Signed)
Medication Instructions:  No changes *If you need a refill on your cardiac medications before your next appointment, please call your pharmacy*   Lab Work: none If you have labs (blood work) drawn today and your tests are completely normal, you will receive your results only by: MyChart Message (if you have MyChart) OR A paper copy in the mail If you have any lab test that is abnormal or we need to change your treatment, we will call you to review the results.   Testing/Procedures: none   Follow-Up: At [facility], you and your health needs are our priority.  As part of our continuing mission to provide you with exceptional heart care, we have created designated Provider Care Teams.  These Care Teams include your primary Cardiologist (physician) and Advanced Practice Providers (APPs -  Physician Assistants and Nurse Practitioners) who all work together to provide you with the care you need, when you need it.   Your next appointment:   12 month(s)  The format for your next appointment:   In Person  Provider:   Christopher McAlhany, MD     Important Information About Sugar       

## 2021-08-04 ENCOUNTER — Telehealth: Payer: Self-pay

## 2021-08-04 NOTE — Telephone Encounter (Signed)
**Note De-Identified Jolayne Branson Obfuscation** Omega-3 ethyl esters started through covermymeds. ?Key: [id number] ?

## 2021-10-07 ENCOUNTER — Other Ambulatory Visit: Payer: Self-pay

## 2021-10-07 MED ORDER — EZETIMIBE 10 MG PO TABS: 10.0000 mg | ORAL_TABLET | Freq: Every day | ORAL | 3 refills | Status: DC

## 2021-11-03 ENCOUNTER — Ambulatory Visit: Payer: 59 | Admitting: Cardiovascular Disease

## 2021-11-03 ENCOUNTER — Other Ambulatory Visit: Payer: Self-pay | Admitting: *Deleted

## 2021-11-03 MED ORDER — CLOPIDOGREL BISULFATE 75 MG PO TABS: 75.0000 mg | ORAL_TABLET | Freq: Every day | ORAL | 3 refills | Status: DC

## 2022-03-31 ENCOUNTER — Other Ambulatory Visit: Payer: Self-pay

## 2022-03-31 MED ORDER — OMEGA-3-ACID ETHYL ESTERS 1 G PO CAPS: 1.0000 | ORAL_CAPSULE | Freq: Two times a day (BID) | ORAL | 1 refills | Status: DC

## 2022-03-31 NOTE — Telephone Encounter (Signed)
Pt's medication was sent to pt's pharmacy as requested. Confirmation received.  °

## 2022-04-16 DIAGNOSIS — R7301 Impaired fasting glucose: Secondary | ICD-10-CM | POA: Diagnosis not present

## 2022-04-16 DIAGNOSIS — I2510 Atherosclerotic heart disease of native coronary artery without angina pectoris: Secondary | ICD-10-CM | POA: Diagnosis not present

## 2022-04-16 DIAGNOSIS — E291 Testicular hypofunction: Secondary | ICD-10-CM | POA: Diagnosis not present

## 2022-04-16 DIAGNOSIS — Z125 Encounter for screening for malignant neoplasm of prostate: Secondary | ICD-10-CM | POA: Diagnosis not present

## 2022-04-16 DIAGNOSIS — E785 Hyperlipidemia, unspecified: Secondary | ICD-10-CM | POA: Diagnosis not present

## 2022-04-23 DIAGNOSIS — R82998 Other abnormal findings in urine: Secondary | ICD-10-CM | POA: Diagnosis not present

## 2022-07-06 DIAGNOSIS — R972 Elevated prostate specific antigen [PSA]: Secondary | ICD-10-CM | POA: Diagnosis not present

## 2022-07-06 DIAGNOSIS — N5201 Erectile dysfunction due to arterial insufficiency: Secondary | ICD-10-CM | POA: Diagnosis not present

## 2022-07-06 DIAGNOSIS — E291 Testicular hypofunction: Secondary | ICD-10-CM | POA: Diagnosis not present

## 2022-07-22 ENCOUNTER — Other Ambulatory Visit: Payer: Self-pay | Admitting: Cardiovascular Disease

## 2022-08-20 ENCOUNTER — Other Ambulatory Visit: Payer: Self-pay

## 2022-08-20 MED ORDER — ROSUVASTATIN CALCIUM 20 MG PO TABS: 10.0000 mg | ORAL_TABLET | Freq: Every day | ORAL | 0 refills | Status: DC

## 2022-08-31 ENCOUNTER — Other Ambulatory Visit: Payer: Self-pay

## 2022-08-31 MED ORDER — EZETIMIBE 10 MG PO TABS: 10.0000 mg | ORAL_TABLET | Freq: Every day | ORAL | 0 refills | Status: DC

## 2022-09-30 ENCOUNTER — Other Ambulatory Visit: Payer: Self-pay | Admitting: Cardiovascular Disease

## 2022-10-08 NOTE — Progress Notes (Signed)
Cardiology Office Note:  .   Date:  10/09/2022  ID:  Jacob Gibbs, DOB [birth year], MRN [id number] PCP: Cleatis Polka., MD  [facility] Providers Cardiologist:  Verne Carrow, MD    Patient Profile: .      PMH: Paroxysmal atrial fibrillation Brief episode in 2005 with no recurrence Not on OAC Hyperlipidemia CAD  S/p CABG x 3 in 2009 (RIMA-LAD, LIMA-OM3, SVG-RCA) STEMI 01/2016 >> emergent cath with severe diffuse disease in SVG to PDA >> DES to native RCA >> LVEF 55% Echocardiogram complete 05/25/2019 LVEF 60-65, no rwma, normal diastolic Mild mitral regurgitation No significant change from echo 2015 OSA Previously on CPAP Weight loss   Last cardiology clinic visit was 08/01/2021 with Dr. Clifton James at which time he was not having any specific cardiac symptoms.  No known recurrence of PAF since 2005. 1 year f/u was recommended.        History of Present Illness: Jacob Gibbs   Jacob Gibbs is a very pleasant 62 y.o. male here today for annual follow-up. Reports he is feeling well. Has occasional flares of costochondritis for which he occasionally takes an Aleve and symptoms resolve. No activity intolerance. Continues to play tennis but is having some shoulder issues. He denies chest pain, shortness of breath, lower extremity edema, fatigue, palpitations, melena, hematuria, hemoptysis, diaphoresis, weakness, presyncope, syncope, orthopnea, and PND. Is going snorkeling in [place], water will be very cold. We discussed avoiding extreme conditions.   ROS: See HPI       Studies Reviewed: Jacob Gibbs   EKG Interpretation Date/Time:  Friday October 09 2022 13:40:52 EDT Ventricular Rate:  63 PR Interval:  166 QRS Duration:  94 QT Interval:  428 QTC Calculation: 437 R Axis:   -19  Text Interpretation: Normal sinus rhythm Incomplete right bundle branch block When compared with ECG of 19-Sep-2018 08:58, No significant change was found Confirmed by Eligha Bridegroom [phone]) on 10/09/2022  3:55:39 PM             Physical Exam:   VS:  BP 122/68   Pulse 63   Ht 5\' 9"  (1.753 m)   Wt 212 lb (96.2 kg)   SpO2 97%   BMI 31.31 kg/m    Wt Readings from Last 3 Encounters:  10/09/22 212 lb (96.2 kg)  08/01/21 205 lb 6.4 oz (93.2 kg)  06/27/20 205 lb (93 kg)    GEN: Well nourished, well developed in no acute distress NECK: No JVD; No carotid bruits CARDIAC: RRR, no murmurs, rubs, gallops RESPIRATORY:  Clear to auscultation without rales, wheezing or rhonchi  ABDOMEN: Soft, non-tender, non-distended EXTREMITIES:  No edema; No deformity     ASSESSMENT AND PLAN: .    CAD: History of CABG x 3 in 2009, s/p STEMI 01/2016 with DES to native RCA. He denies chest pain, dyspnea, or other symptoms concerning for angina. No indication for further ischemic evaluation at this time. Secondary prevention emphasized including reducing LDL, 150 minutes moderate intensity exercise each week, along with heart healthy mostly plant-based diet.  Hyperlipidemia LDL goal < 55: LDL 84 on 04/16/22. Reports LDL was at its best when he was under 200 pounds.  He is working on weight loss. Lengthy discussion about LDL reduction.  He is already on ezetimibe, rosuvastatin, Lovaza. Is on low dose rosuvastatin. We will see if Repatha or other PCSK9i is approved by his insurance. If not, consider increasing rosuvastatin dose. Continue to work on healthy diet, weight loss, and achieving 150  minutes of moderate intensity exercise each week.   PAF: No evidence of atrial fibrillation in many years. Encouraged awareness of OSA to lessen risk of a fib in the future.   OSA: Previously intolerant of CPAP. Wife reports snoring, apnea episodes stopped with weight loss. He is aware that he may be gaining weight back and he has started to snore a little again.  Encouraged continued awareness. We discussed new devices that monitor sleep that he could consider purchasing.         Dispo: 1 year with Dr. Clifton James    Signed, Eligha Bridegroom, NP-C

## 2022-10-09 ENCOUNTER — Encounter: Payer: Self-pay | Admitting: Nurse Practitioner

## 2022-10-09 ENCOUNTER — Ambulatory Visit: Payer: No Typology Code available for payment source | Attending: Nurse Practitioner | Admitting: Nurse Practitioner

## 2022-10-09 VITALS — BP 122/68 | HR 63 | Ht 69.0 in | Wt 212.0 lb

## 2022-10-09 DIAGNOSIS — E7800 Pure hypercholesterolemia, unspecified: Secondary | ICD-10-CM

## 2022-10-09 DIAGNOSIS — I25810 Atherosclerosis of coronary artery bypass graft(s) without angina pectoris: Secondary | ICD-10-CM | POA: Diagnosis not present

## 2022-10-09 DIAGNOSIS — I480 Paroxysmal atrial fibrillation: Secondary | ICD-10-CM

## 2022-10-09 DIAGNOSIS — G4733 Obstructive sleep apnea (adult) (pediatric): Secondary | ICD-10-CM

## 2022-10-09 MED ORDER — ROSUVASTATIN CALCIUM 10 MG PO TABS: 10.0000 mg | ORAL_TABLET | Freq: Every day | ORAL | 3 refills | Status: DC

## 2022-10-09 MED ORDER — REPATHA SURECLICK 140 MG/ML ~~LOC~~ SOAJ: 140.0000 mg | SUBCUTANEOUS | 11 refills | Status: DC

## 2022-10-09 MED ORDER — NITROGLYCERIN 0.4 MG SL SUBL: 0.4000 mg | SUBLINGUAL_TABLET | SUBLINGUAL | 3 refills | Status: DC | PRN

## 2022-10-09 MED ORDER — CLOPIDOGREL BISULFATE 75 MG PO TABS: 75.0000 mg | ORAL_TABLET | Freq: Every day | ORAL | 3 refills | Status: DC

## 2022-10-09 NOTE — Patient Instructions (Signed)
Medication Instructions:  Your physician recommends that you continue on your current medications as directed. Please refer to the Current Medication list given to you today. START Repatha 140mg  injection every 14 days.   *If you need a refill on your cardiac medications before your next appointment, please call your pharmacy*   Lab Work: None  If you have labs (blood work) drawn today and your tests are completely normal, you will receive your results only by: MyChart Message (if you have MyChart) OR A paper copy in the mail If you have any lab test that is abnormal or we need to change your treatment, we will call you to review the results.   Testing/Procedures: None   Follow-Up: At [facility], you and your health needs are our priority.  As part of our continuing mission to provide you with exceptional heart care, we have created designated Provider Care Teams.  These Care Teams include your primary Cardiologist (physician) and Advanced Practice Providers (APPs -  Physician Assistants and Nurse Practitioners) who all work together to provide you with the care you need, when you need it.  We recommend signing up for the patient portal called "MyChart".  Sign up information is provided on this After Visit Summary.  MyChart is used to connect with patients for Virtual Visits (Telemedicine).  Patients are able to view lab/test results, encounter notes, upcoming appointments, etc.  Non-urgent messages can be sent to your provider as well.   To learn more about what you can do with MyChart, go to [email or web address].    Your next appointment:   1 year(s)  Provider:   Verne Carrow, MD

## 2022-10-13 ENCOUNTER — Other Ambulatory Visit (HOSPITAL_COMMUNITY): Payer: Self-pay

## 2022-10-13 ENCOUNTER — Telehealth: Payer: Self-pay

## 2022-10-13 NOTE — Telephone Encounter (Signed)
Jacob Gibbs, just ran a test claim. Looks like pharmacy filled Repatha for him on 10/09/22. He should be able to get it again after 10/31/22.

## 2022-10-13 NOTE — Telephone Encounter (Signed)
-----   Message from Levi Aland sent at 10/09/2022  3:57 PM EDT ----- Hi,  Would you please provide PA for Repatha for this pt or if another agent is preferred, please let me know.  Thank you! Marcelino Duster

## 2022-10-25 ENCOUNTER — Other Ambulatory Visit: Payer: Self-pay | Admitting: Cardiovascular Disease

## 2022-11-03 ENCOUNTER — Telehealth: Payer: Self-pay | Admitting: Cardiovascular Disease

## 2022-11-03 MED ORDER — METOPROLOL TARTRATE 25 MG PO TABS: ORAL_TABLET | ORAL | 3 refills | Status: DC

## 2022-11-03 NOTE — Telephone Encounter (Signed)
Pt's medication was sent to pt's pharmacy as requested. Confirmation received.  °

## 2022-11-03 NOTE — Telephone Encounter (Signed)
*  STAT* If patient is at the pharmacy, call can be transferred to refill team.   1. Which medications need to be refilled? (please list name of each medication and dose if known) metoprolol tartrate (LOPRESSOR) 25 MG tablet   2. Which pharmacy/location (including street and city if local pharmacy) is medication to be sent to? [id number] - [place], [place] - [address]   3. Do they need a 30 day or 90 day supply? 90 Day Supply  Pt had an appt on 10/09/22 for a yearly follow up.He currently out of the medication

## 2022-11-09 ENCOUNTER — Other Ambulatory Visit: Payer: Self-pay | Admitting: Cardiovascular Disease

## 2022-11-20 ENCOUNTER — Encounter: Payer: Self-pay | Admitting: Orthopaedic Surgery

## 2022-11-20 ENCOUNTER — Ambulatory Visit: Payer: No Typology Code available for payment source | Admitting: Orthopaedic Surgery

## 2022-11-20 ENCOUNTER — Other Ambulatory Visit (INDEPENDENT_AMBULATORY_CARE_PROVIDER_SITE_OTHER): Payer: No Typology Code available for payment source

## 2022-11-20 DIAGNOSIS — G8929 Other chronic pain: Secondary | ICD-10-CM

## 2022-11-20 DIAGNOSIS — M25511 Pain in right shoulder: Secondary | ICD-10-CM

## 2022-11-20 NOTE — Progress Notes (Signed)
Office Visit Note   Patient: Jacob Gibbs           Date of Birth: [birth year]           MRN: [id number] Visit Date: 11/20/2022              Requested by: Cleatis Polka., MD [address] [place],  [place] [address] PCP: Cleatis Polka., MD   Assessment & Plan: Visit Diagnoses:  1. Chronic right shoulder pain     Plan: Jacob Gibbs is a 62 year old gentleman with 6 weeks of right shoulder pain without specific injury.  I feel that his rotator cuff and more so his biceps tendon is inflamed from overactivity.  I would like to send him to Dr. Shon Baton for ultrasound-guided intra-articular steroid injection.  He will modify as needed and follow-up if symptoms do not improve.  Follow-Up Instructions: No follow-ups on file.   Orders:  Orders Placed This Encounter  Procedures   XR Shoulder Right   No orders of the defined types were placed in this encounter.     Procedures: No procedures performed   Clinical Data: No additional findings.   Subjective: Chief Complaint  Patient presents with   Right Shoulder - Pain    HPI Jacob Gibbs is a 62 year old gentleman who I know quite well comes in for 6 weeks of right shoulder pain.  He states the pain started after a fair amount of activity early in July in which she played tennis and also did housework and a bunch of overhead activities.  Now the pain radiates anteriorly.  He underwent rotator cuff repair and biceps tenodesis for SLAP tear in 2020.  He feels global pain throughout the shoulder. Review of Systems  Constitutional: Negative.   HENT: Negative.    Eyes: Negative.   Respiratory: Negative.    Cardiovascular: Negative.   Gastrointestinal: Negative.   Endocrine: Negative.   Genitourinary: Negative.   Skin: Negative.   Allergic/Immunologic: Negative.   Neurological: Negative.   Hematological: Negative.   Psychiatric/Behavioral: Negative.    All other systems reviewed and are negative.    Objective: Vital Signs:  There were no vitals taken for this visit.  Physical Exam Vitals and nursing note reviewed.  Constitutional:      Appearance: He is well-developed.  HENT:     Head: Normocephalic and atraumatic.  Eyes:     Pupils: Pupils are equal, round, and reactive to light.  Pulmonary:     Effort: Pulmonary effort is normal.  Abdominal:     Palpations: Abdomen is soft.  Musculoskeletal:        General: Normal range of motion.     Cervical back: Neck supple.  Skin:    General: Skin is warm.  Neurological:     Mental Status: He is alert and oriented to person, place, and time.  Psychiatric:        Behavior: Behavior normal.        Thought Content: Thought content normal.        Judgment: Judgment normal.     Ortho Exam Examination of the right shoulder shows baseline range of motion in all planes.  Manual muscle testing of the rotator cuff is slightly weak secondary to pain.  He has more pain with Speed test and tenderness to the bicipital groove.  Negative O'Brien's. Specialty Comments:  No specialty comments available.  Imaging: No results found.   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 2009: mutivessel CAD s/p CABG   b. 01/2016: inf STEMI s/p DES to RCA   DDD (degenerative disc disease), cervical    History of tobacco use    REMOTE   Hyperlipidemia    PAF (paroxysmal atrial fibrillation) (HCC)    a. brief episode in dec 2005 wtih no recurrence. No indication for  [facility]   Sleep apnea    uses cpap   Syncope and collapse     Family History  Problem Relation Age of Onset   Coronary artery disease Mother        ALSO ONE OF HIS UNCLES    Past Surgical History:  Procedure Laterality Date   CARDIAC CATHETERIZATION N/A 02/24/2016   Procedure: Coronary Stent Intervention;  Surgeon: Tonny Bollman, MD;  Location: [facility] INVASIVE CV LAB;  Service: Cardiovascular;  Laterality: N/A;   CARDIAC CATHETERIZATION N/A 02/24/2016   Procedure: Left Heart Cath and Cors/Grafts Angiography;  Surgeon: Tonny Bollman, MD;  Location: [facility] INVASIVE CV LAB;  Service: Cardiovascular;  Laterality: N/A;   CORONARY ARTERY BYPASS GRAFT  02/06/08   X 3   SHOULDER ARTHROSCOPY WITH ROTATOR CUFF REPAIR AND SUBACROMIAL DECOMPRESSION Right 09/21/2018   Procedure: RIGHT SHOULDER ARTHROSCOPY WITH EXTENSIVE DEBRIDEMENT, BICEPS TENODESIS, SUBACROMIAL DECOMPRESSION,  ROTATOR CUFF REPAIR;  Surgeon: Tarry Kos, MD;  Location: [facility];  Service: Orthopedics;  Laterality: Right;   SHOULDER ARTHROSCOPY WITH SUBACROMIAL DECOMPRESSION Left 03/27/2020   Procedure: LEFT SHOULDER ARTHROSCOPY WITH EXTENSIVE DEBRIDEMENT LABRUM AND BICEPS TENDON, SUBACROMIAL DECOMPRESSION, LYSIS OF ADHESIONS, MANIPULATION UNDER ANESTHESIA;  Surgeon: Tarry Kos, MD;  Location:  SURGERY CENTER;  Service: Orthopedics;  Laterality: Left;   Social History   Occupational History   Occupation: Firefighter  Tobacco Use   Smoking status: Former    Current packs/day: 0.00    Average packs/day: 0.5 packs/day for 20.0 years (10.0 ttl pk-yrs)    Types: Cigarettes    Start date: 01/07/1984    Quit date: 01/07/2004    Years since quitting: 18.8   Smokeless tobacco: Never   Tobacco comments:    Stopped 67yrs ago  Substance and Sexual Activity   Alcohol use: Yes    Alcohol/week: 3.0 standard drinks of alcohol    Types: 3 Standard drinks or equivalent per week    Comment: OCCASIONAL   Drug use:  No   Sexual activity: Not on file

## 2022-11-24 ENCOUNTER — Other Ambulatory Visit: Payer: Self-pay

## 2022-11-24 ENCOUNTER — Ambulatory Visit: Payer: No Typology Code available for payment source | Admitting: Sports Medicine

## 2022-11-24 ENCOUNTER — Encounter: Payer: Self-pay | Admitting: Sports Medicine

## 2022-11-24 DIAGNOSIS — M25511 Pain in right shoulder: Secondary | ICD-10-CM

## 2022-11-24 DIAGNOSIS — G8929 Other chronic pain: Secondary | ICD-10-CM | POA: Diagnosis not present

## 2022-11-24 MED ORDER — METHYLPREDNISOLONE ACETATE 40 MG/ML IJ SUSP
80.0000 mg | INTRAMUSCULAR | Status: AC | PRN
Start: 2022-11-24 — End: 2022-11-24
  Administered 2022-11-24: 80 mg via INTRA_ARTICULAR

## 2022-11-24 MED ORDER — LIDOCAINE HCL 1 % IJ SOLN
2.0000 mL | INTRAMUSCULAR | Status: AC | PRN
Start: 2022-11-24 — End: 2022-11-24
  Administered 2022-11-24: 2 mL

## 2022-11-24 MED ORDER — BUPIVACAINE HCL 0.25 % IJ SOLN
2.0000 mL | INTRAMUSCULAR | Status: AC | PRN
Start: 2022-11-24 — End: 2022-11-24
  Administered 2022-11-24: 2 mL via INTRA_ARTICULAR

## 2022-11-24 NOTE — Progress Notes (Signed)
   Procedure Note  Patient: Jacob Gibbs             Date of Birth: [birth year]           MRN: [id number]             Visit Date: 11/24/2022  HPI: Jacob Gibbs is a very pleasant 62 year old male who presents for right shoulder pain x 6 weeks.  Saw my partner, Dr. Roda Shutters recommended ultrasound-guided glenohumeral joint injection.  [name] is leaving for a trip to [place] on Thursday, he is also asking what can be done to help prevent this in the future and rehab inquiry.   - Notable Hx of R- rotator cuff repair and biceps tenodesis for SLAP tear in 2020.   PE: + TTP in the bicipital groove and anterior glenohumeral joint, mild pain at Codman's point.  There is about 10 degrees less of anterior flexion and abduction.  No blocks to internal or external rotation.  No AC joint TTP.  No swelling or effusion noted.  Imaging:  XR Shoulder Right X-rays demonstrate no acute abnormalities.  There is presence of metallic  anchor from prior biceps tenodesis  Visit Diagnoses:  1. Chronic right shoulder pain    Procedures:  Large Joint Inj: R glenohumeral on 11/24/2022 10:47 AM Indications: pain Details: 22 G 3.5 in needle, ultrasound-guided posterior approach Medications: 2 mL lidocaine 1 %; 2 mL bupivacaine 0.25 %; 80 mg methylPREDNISolone acetate 40 MG/ML Outcome: tolerated well, no immediate complications  US-guided glenohumeral joint injection, right shoulder After discussion on risks/benefits/indications, informed verbal consent was obtained. A timeout was then performed. The patient was positioned lying lateral recumbent on examination table. The patient's shoulder was prepped with betadine and multiple alcohol swabs and utilizing ultrasound guidance, the patient's glenohumeral joint was identified on ultrasound. Using ultrasound guidance a 22-gauge, 3.5 inch needle with a mixture of 2:2:2 cc's lidocaine:bupivicaine:depomedrol was directed from a lateral to medial direction via in-plane technique into the  glenohumeral joint with visualization of appropriate spread of injectate into the joint. Patient tolerated the procedure well without immediate complications.      Procedure, treatment alternatives, risks and benefits explained, specific risks discussed. Consent was given by the patient. Immediately prior to procedure a time out was called to verify the correct patient, procedure, equipment, support staff and site/side marked as required. Patient was prepped and draped in the usual sterile fashion.    - I evaluated the patient about 5 minutes post-injection and he was doing well without AE's  - Advised Tylenol, ice for any postinjection pain - Did have a discussion that once he returns from his trip, I do think it would be helpful for his shoulder pain as well as maintenance and prevention for him to restart home rotator cuff exercises with [employer] and his exercise rehab. He states he has the sheets at home and is comfortable performing these exercises - follow-up with Dr. Roda Shutters as indicated; I am happy to see them as needed  Madelyn Brunner, DO Primary Care Sports Medicine Physician  [facility]  This note was dictated using Dragon naturally speaking software and may contain errors in syntax, spelling, or content which have not been identified prior to signing this note.

## 2022-12-26 ENCOUNTER — Ambulatory Visit (HOSPITAL_COMMUNITY)
Admission: EM | Admit: 2022-12-26 | Discharge: 2022-12-26 | Disposition: A | Discharge status: Home / Self Care | Payer: No Typology Code available for payment source | Attending: Emergency Medicine | Admitting: Emergency Medicine

## 2022-12-26 ENCOUNTER — Encounter (HOSPITAL_COMMUNITY): Payer: Self-pay

## 2022-12-26 ENCOUNTER — Ambulatory Visit: Payer: Self-pay

## 2022-12-26 DIAGNOSIS — K5732 Diverticulitis of large intestine without perforation or abscess without bleeding: Secondary | ICD-10-CM | POA: Diagnosis not present

## 2022-12-26 DIAGNOSIS — R1013 Epigastric pain: Secondary | ICD-10-CM

## 2022-12-26 MED ORDER — PANTOPRAZOLE SODIUM 20 MG PO TBEC: 20.0000 mg | DELAYED_RELEASE_TABLET | Freq: Every day | ORAL | 0 refills | Status: AC

## 2022-12-26 MED ORDER — AMOXICILLIN-POT CLAVULANATE 875-125 MG PO TABS: 1.0000 | ORAL_TABLET | Freq: Two times a day (BID) | ORAL | 0 refills | Status: AC

## 2022-12-26 NOTE — ED Triage Notes (Signed)
Abdominal pain, constipation x 2 days. Patient tried prunes with no relief. Patient having a constant feeling of needing to go to the bathroom. Tried Mylanta and threw it up this morning. Reduced appetite.   No dietary or medication changes. Had COVID on 12/12/22.

## 2022-12-26 NOTE — ED Provider Notes (Signed)
[facility]    CSN: [id number] Arrival date & time: 12/26/22  1227      History   Chief Complaint Chief Complaint  Patient presents with   Abdominal Pain   Constipation    HPI DELOSS AMICO is a 62 y.o. male.   The history is provided by the patient.  Abdominal Pain Pain location:  Epigastric Pain quality: aching and gnawing   Relieved by:  Nothing Ineffective treatments: Mylanta. Associated symptoms: constipation   Constipation Associated symptoms: abdominal pain     Past Medical History:  Diagnosis Date   Anxiety    Coronary artery disease    a. 2009: mutivessel CAD s/p CABG   b. 01/2016: inf STEMI s/p DES to RCA   DDD (degenerative disc disease), cervical    History of tobacco use    REMOTE   Hyperlipidemia    PAF (paroxysmal atrial fibrillation) (HCC)    a. brief episode in dec 2005 wtih no recurrence. 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 03/27/2020   Coronary artery disease    PAF (paroxysmal atrial fibrillation) (HCC)    Acute ST elevation myocardial infarction (STEMI) of inferior wall (HCC) 02/24/2016   HLD (hyperlipidemia) 08/19/2010    Past Surgical History:  Procedure Laterality Date   CARDIAC CATHETERIZATION N/A 02/24/2016   Procedure: Coronary Stent Intervention;  Surgeon: Tonny Bollman, MD;  Location: [facility] INVASIVE CV LAB;  Service: Cardiovascular;  Laterality: N/A;   CARDIAC CATHETERIZATION N/A 02/24/2016   Procedure: Left Heart Cath and Cors/Grafts Angiography;  Surgeon: Tonny Bollman, MD;  Location: [facility] INVASIVE CV LAB;  Service: Cardiovascular;  Laterality: N/A;   CORONARY ARTERY BYPASS GRAFT  02/06/08   X 3   SHOULDER ARTHROSCOPY  WITH ROTATOR CUFF REPAIR AND SUBACROMIAL DECOMPRESSION Right 09/21/2018   Procedure: RIGHT SHOULDER ARTHROSCOPY WITH EXTENSIVE DEBRIDEMENT, BICEPS TENODESIS, SUBACROMIAL DECOMPRESSION,  ROTATOR CUFF REPAIR;  Surgeon: Tarry Kos, MD;  Location: [facility];  Service: Orthopedics;  Laterality: Right;   SHOULDER ARTHROSCOPY WITH SUBACROMIAL DECOMPRESSION Left 03/27/2020   Procedure: LEFT SHOULDER ARTHROSCOPY WITH EXTENSIVE DEBRIDEMENT LABRUM AND BICEPS TENDON, SUBACROMIAL DECOMPRESSION, LYSIS OF ADHESIONS, MANIPULATION UNDER ANESTHESIA;  Surgeon: Tarry Kos, MD;  Location: [facility];  Service: Orthopedics;  Laterality: Left;       Home Medications    Prior to Admission medications   Medication Sig Start Date End Date Taking? Authorizing Provider  amoxicillin-clavulanate (AUGMENTIN) 875-125 MG tablet Take 1 tablet by mouth every 12 (twelve) hours. 12/26/22  Yes Dariyah Garduno, Linde Gillis, NP  clopidogrel (PLAVIX) 75 MG tablet Take 1 tablet (75 mg total) by mouth daily. 10/09/22  Yes Swinyer, Zachary George, NP  Evolocumab (REPATHA SURECLICK) 140 MG/ML SOAJ Inject 140 mg into the skin every 14 (fourteen) days. 10/09/22  Yes Swinyer, Zachary George, NP  ezetimibe (ZETIA) 10 MG tablet TAKE 1 TABLET(10 MG) BY MOUTH DAILY 11/09/22  Yes Kathleene Hazel, MD  metoprolol tartrate (LOPRESSOR) 25 MG tablet TAKE 1/2 TABLET BY MOUTH TWICE DAILY 11/03/22  Yes Kathleene Hazel, MD  omega-3 acid ethyl esters (LOVAZA) 1 g capsule TAKE 1 CAPSULE BY MOUTH TWICE DAILY 07/22/22  Yes Kathleene Hazel, MD  pantoprazole (PROTONIX) 20 MG tablet Take 1 tablet (20 mg total) by mouth daily. 12/26/22  Yes Keiran Gaffey, Linde Gillis, NP  rosuvastatin (CRESTOR) 10 MG tablet Take 1 tablet (10 mg total) by mouth daily. 10/09/22  Yes Swinyer, Zachary George, NP  testosterone cypionate (DEPOTESTOSTERONE CYPIONATE) 200 MG/ML injection Inject 100 mg into the muscle every Tuesday.   Yes [provider]   methocarbamol (ROBAXIN) 750 MG tablet Take 1 tablet (750 mg total) by mouth 2 (two) times daily as needed for muscle spasms. 03/27/20   Tarry Kos, MD  nitroGLYCERIN (NITROSTAT) 0.4 MG SL tablet Place 1 tablet (0.4 mg total) under the tongue every 5 (five) minutes x 3 doses as needed for chest pain. 10/09/22   Swinyer, Zachary George, NP  traMADol (ULTRAM) 50 MG tablet Take 1 tablet (50 mg total) by mouth every 6 (six) hours as needed. 04/03/20   Cristie Hem, PA-C    Family History Family History  Problem Relation Age of Onset   Coronary artery disease Mother        ALSO ONE OF HIS UNCLES    Social History Social History   Tobacco Use   Smoking status: Former    Current packs/day: 0.00    Average packs/day: 0.5 packs/day for 20.0 years (10.0 ttl pk-yrs)    Types: Cigarettes    Start date: 01/07/1984    Quit date: 01/07/2004    Years since quitting: 18.9   Smokeless tobacco: Never   Tobacco comments:    Stopped 51yrs ago  Substance Use Topics   Alcohol use: Yes    Alcohol/week: 3.0 standard drinks of alcohol    Types: 3 Standard drinks or equivalent per week    Comment: OCCASIONAL   Drug use: No     Allergies   Contrast media [iodinated contrast media]   Review of Systems Review of Systems  Gastrointestinal:  Positive for abdominal pain and constipation.     Physical Exam Triage Vital Signs ED Triage Vitals  Encounter Vitals Group     BP 12/26/22 1245 135/84     Systolic BP Percentile --      Diastolic BP Percentile --      Pulse Rate 12/26/22 1245 (!) 55     Resp 12/26/22 1245 16     Temp 12/26/22 1245 98.1 F (36.7 C)     Temp Source 12/26/22 1245 Oral     SpO2 12/26/22 1245 96 %     Weight 12/26/22 1245 206 lb (93.4 kg)     Height 12/26/22 1245 5\' 9"  (1.753 m)     Head Circumference --      Peak Flow --      Pain Score 12/26/22 1242 7     Pain Loc --      Pain Education --      Exclude from Growth Chart --    No data found.  Updated Vital  Signs BP 135/84 (BP Location: Right Arm)   Pulse (!) 55   Temp 98.1 F (36.7 C) (Oral)   Resp 16   Ht 5\' 9"  (1.753 m)   Wt 206 lb (93.4 kg)   SpO2 96%   BMI 30.42 kg/m   Visual Acuity Right Eye Distance:   Left Eye Distance:   Bilateral Distance:    Right Eye Near:   Left Eye Near:    Bilateral Near:     Physical Exam Vitals and nursing note reviewed.  Abdominal:  Tenderness: There is abdominal tenderness in the epigastric area and left lower quadrant.     Comments: Tenderness at the left lower quadrant and epigastric area      UC Treatments / Results  Labs (all labs ordered are listed, but only abnormal results are displayed) Labs Reviewed - No data to display  EKG   Radiology No results found.  Procedures Procedures (including critical care time)  Medications Ordered in UC Medications - No data to display  Initial Impression / Assessment and Plan / UC Course  I have reviewed the triage vital signs and the nursing notes.  Pertinent labs & imaging results that were available during my care of the patient were reviewed by me and considered in my medical decision making (see chart for details).   Patient is reporting epigastric tenderness, bloating x 1 week.  He has tried Mylanta without resolution.  He had 1 episode of emesis this morning after taking Mylanta.  Emesis was bile and  Mylanta. Exam was positive for tenderness in the left lower quadrant. 1.  Diverticulitis will treat with Augmentin 2.  Gastritis we will treat with pantoprazole.  Patient encouraged to follow-up with PCP if no resolution or improvement in symptoms.   Final Clinical Impressions(s) / UC Diagnoses   Final diagnoses:  Diverticulitis of colon  Dyspepsia     Discharge Instructions      Take medications as ordered.  Follow-up with PCP if no improvement in symptoms.     ED Prescriptions     Medication Sig Dispense Auth. Provider   amoxicillin-clavulanate (AUGMENTIN)  875-125 MG tablet Take 1 tablet by mouth every 12 (twelve) hours. 20 tablet Myran Arcia, Linde Gillis, NP   pantoprazole (PROTONIX) 20 MG tablet Take 1 tablet (20 mg total) by mouth daily. 14 tablet Yaxiel Minnie, Linde Gillis, NP      PDMP not reviewed this encounter.

## 2022-12-26 NOTE — Discharge Instructions (Addendum)
Take medications as ordered.  Follow-up with PCP if no improvement in symptoms.

## 2023-02-02 ENCOUNTER — Other Ambulatory Visit: Payer: Self-pay | Admitting: Cardiovascular Disease

## 2023-02-14 DIAGNOSIS — S76312A Strain of muscle, fascia and tendon of the posterior muscle group at thigh level, left thigh, initial encounter: Secondary | ICD-10-CM | POA: Diagnosis not present

## 2023-02-23 ENCOUNTER — Other Ambulatory Visit: Payer: Self-pay | Admitting: Orthopaedic Surgery

## 2023-02-23 MED ORDER — PREDNISONE 10 MG (21) PO TBPK: ORAL_TABLET | ORAL | 3 refills | Status: AC

## 2023-02-23 MED ORDER — CYCLOBENZAPRINE HCL 5 MG PO TABS
5.0000 mg | ORAL_TABLET | Freq: Every evening | ORAL | 3 refills | Status: AC | PRN
Start: 2023-02-23 — End: ?

## 2023-02-23 MED ORDER — TRAMADOL HCL 50 MG PO TABS: 50.0000 mg | ORAL_TABLET | Freq: Two times a day (BID) | ORAL | 0 refills | Status: AC | PRN

## 2023-02-23 MED ORDER — GABAPENTIN 100 MG PO CAPS: 100.0000 mg | ORAL_CAPSULE | Freq: Three times a day (TID) | ORAL | 3 refills | Status: AC | PRN

## 2023-03-05 ENCOUNTER — Encounter: Payer: Self-pay | Admitting: Orthopaedic Surgery

## 2023-03-05 ENCOUNTER — Other Ambulatory Visit (INDEPENDENT_AMBULATORY_CARE_PROVIDER_SITE_OTHER): Payer: Self-pay

## 2023-03-05 ENCOUNTER — Ambulatory Visit: Payer: No Typology Code available for payment source | Admitting: Orthopaedic Surgery

## 2023-03-05 DIAGNOSIS — M545 Low back pain, unspecified: Secondary | ICD-10-CM

## 2023-03-05 DIAGNOSIS — G8929 Other chronic pain: Secondary | ICD-10-CM

## 2023-03-05 NOTE — Progress Notes (Signed)
Office Visit Note   Patient: Jacob Gibbs           Date of Birth: 1960/12/07           MRN: [id number] Visit Date: 03/05/2023              Requested by: Cleatis Polka., MD [address] [place],  [place] [address] PCP: Cleatis Polka., MD   Assessment & Plan: Visit Diagnoses:  1. Chronic low back pain, unspecified back pain laterality, unspecified whether sciatica present     Plan: Datron is a 62 year old gentleman with lumbar radiculopathy.  Due to the focal weakness to ankle dorsiflexion will order MRI to further assess.  Follow-up after the MRI.  Follow-Up Instructions: No follow-ups on file.   Orders:  Orders Placed This Encounter  Procedures   XR Lumbar Spine 2-3 Views   MR Lumbar Spine w/o contrast   No orders of the defined types were placed in this encounter.     Procedures: No procedures performed   Clinical Data: No additional findings.   Subjective: Chief Complaint  Patient presents with   Lower Back - Pain    HPI Jacob Gibbs is a 62 year old gentleman who comes in for back pain and left leg pain.  He started having symptoms a month ago when he lifted something and then got worse after he played tennis.  Then he felt a pop in his posterior thigh when he will was bowling.  Sometime afterward he started having pain in his left leg and calf and numbness to the lesser toes.  I actually sent in prescription for prednisone Dosepak which helped partially.  His pain has somewhat returned now that he has been off of the Dosepak.  He feels that he has weakness with some motor functions of the left lower extremity.    Review of Systems  Constitutional: Negative.   HENT: Negative.    Eyes: Negative.   Respiratory: Negative.    Cardiovascular: Negative.   Gastrointestinal: Negative.   Endocrine: Negative.   Genitourinary: Negative.   Musculoskeletal:  Positive for back pain.  Skin: Negative.   Allergic/Immunologic: Negative.   Neurological: Negative.    Hematological: Negative.   Psychiatric/Behavioral: Negative.    All other systems reviewed and are negative.    Objective: Vital Signs: There were no vitals taken for this visit.  Physical Exam Vitals and nursing note reviewed.  Constitutional:      Appearance: He is well-developed.  Pulmonary:     Effort: Pulmonary effort is normal.  Abdominal:     Palpations: Abdomen is soft.  Skin:    General: Skin is warm.  Neurological:     Mental Status: He is alert and oriented to person, place, and time.  Psychiatric:        Behavior: Behavior normal.        Thought Content: Thought content normal.        Judgment: Judgment normal.     Ortho Exam Exam of the left hamstring is unremarkable.  Exam of the left hip is unremarkable.  He has decreased patellar reflex and he has 4 out of 5 ankle dorsiflexion strength compared to 5 out of 5 on the contralateral side.  There is no clonus.  Specialty Comments:  No specialty comments available.  Imaging: XR Lumbar Spine 2-3 Views  Result Date: 03/05/2023 X-rays of the lumbar spine show diffuse degenerative changes of the facet joints and the vertebral bodies.    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 2009: mutivessel CAD s/p CABG   b. 01/2016: inf STEMI s/p DES to RCA   DDD (degenerative disc disease), cervical    History of tobacco use    REMOTE   Hyperlipidemia    PAF (paroxysmal atrial fibrillation) (HCC)    a.  brief episode in dec 2005 wtih no recurrence. No indication for [facility]   Sleep apnea    uses cpap   Syncope and collapse     Family History  Problem Relation Age of Onset   Coronary artery disease Mother        ALSO ONE OF HIS UNCLES    Past Surgical History:  Procedure Laterality Date   CARDIAC CATHETERIZATION N/A 02/24/2016   Procedure: Coronary Stent Intervention;  Surgeon: Tonny Bollman, MD;  Location: [facility] INVASIVE CV LAB;  Service: Cardiovascular;  Laterality: N/A;   CARDIAC CATHETERIZATION N/A 02/24/2016   Procedure: Left Heart Cath and Cors/Grafts Angiography;  Surgeon: Tonny Bollman, MD;  Location: [facility] INVASIVE CV LAB;  Service: Cardiovascular;  Laterality: N/A;   CORONARY ARTERY BYPASS GRAFT  02/06/08   X 3   SHOULDER ARTHROSCOPY WITH ROTATOR CUFF REPAIR AND SUBACROMIAL DECOMPRESSION Right 09/21/2018   Procedure: RIGHT SHOULDER ARTHROSCOPY WITH EXTENSIVE DEBRIDEMENT, BICEPS TENODESIS, SUBACROMIAL DECOMPRESSION,  ROTATOR CUFF REPAIR;  Surgeon: Tarry Kos, MD;  Location: [facility];  Service: Orthopedics;  Laterality: Right;   SHOULDER ARTHROSCOPY WITH SUBACROMIAL DECOMPRESSION Left 03/27/2020   Procedure: LEFT SHOULDER ARTHROSCOPY WITH EXTENSIVE DEBRIDEMENT LABRUM AND BICEPS TENDON, SUBACROMIAL DECOMPRESSION, LYSIS OF ADHESIONS, MANIPULATION UNDER ANESTHESIA;  Surgeon: Tarry Kos, MD;  Location: [facility];  Service: Orthopedics;  Laterality: Left;   Social History   Occupational History   Occupation: Firefighter  Tobacco Use   Smoking status: Former    Current packs/day: 0.00    Average packs/day: 0.5 packs/day for 20.0 years (10.0 ttl pk-yrs)    Types: Cigarettes    Start date: 01/07/1984    Quit date: 01/07/2004    Years since quitting: 19.1   Smokeless tobacco: Never   Tobacco comments:    Stopped 31yrs ago  Substance and Sexual Activity   Alcohol use: Yes    Alcohol/week: 3.0 standard drinks of alcohol    Types: 3 Standard  drinks or equivalent per week    Comment: OCCASIONAL   Drug use: No   Sexual activity: Not on file

## 2023-03-07 ENCOUNTER — Inpatient Hospital Stay
Admission: RE | Admit: 2023-03-07 | Discharge: 2023-03-07 | Discharge status: Home / Self Care | Payer: No Typology Code available for payment source | Source: Ambulatory Visit | Attending: Orthopaedic Surgery | Admitting: Orthopaedic Surgery

## 2023-03-07 DIAGNOSIS — M545 Low back pain, unspecified: Secondary | ICD-10-CM

## 2023-03-07 DIAGNOSIS — M5126 Other intervertebral disc displacement, lumbar region: Secondary | ICD-10-CM | POA: Diagnosis not present

## 2023-03-08 ENCOUNTER — Other Ambulatory Visit: Payer: Self-pay

## 2023-03-08 DIAGNOSIS — M545 Low back pain, unspecified: Secondary | ICD-10-CM

## 2023-03-10 DIAGNOSIS — Z6831 Body mass index (BMI) 31.0-31.9, adult: Secondary | ICD-10-CM | POA: Diagnosis not present

## 2023-03-10 DIAGNOSIS — M5126 Other intervertebral disc displacement, lumbar region: Secondary | ICD-10-CM | POA: Diagnosis not present

## 2023-03-11 ENCOUNTER — Ambulatory Visit: Payer: No Typology Code available for payment source | Admitting: Rehabilitative and Restorative Service Providers"

## 2023-03-11 NOTE — Therapy (Incomplete)
OUTPATIENT PHYSICAL THERAPY THORACOLUMBAR EVALUATION   Patient Name: Jacob Gibbs MRN: [id number] DOB:Jun 14, 1960, 62 y.o., male Today's Date: 03/11/2023  END OF SESSION:   Past Medical History:  Diagnosis Date   Anxiety    Coronary artery disease    a. 2009: mutivessel CAD s/p CABG   b. 01/2016: inf STEMI s/p DES to RCA   DDD (degenerative disc disease), cervical    History of tobacco use    REMOTE   Hyperlipidemia    PAF (paroxysmal atrial fibrillation) (HCC)    a. brief episode in dec 2005 wtih no recurrence. No indication for [facility]   Sleep apnea    uses cpap   Syncope and collapse    Past Surgical History:  Procedure Laterality Date   CARDIAC CATHETERIZATION N/A 02/24/2016   Procedure: Coronary Stent Intervention;  Surgeon: Tonny Bollman, MD;  Location: [facility] INVASIVE CV LAB;  Service: Cardiovascular;  Laterality: N/A;   CARDIAC CATHETERIZATION N/A 02/24/2016   Procedure: Left Heart Cath and Cors/Grafts Angiography;  Surgeon: Tonny Bollman, MD;  Location: [facility] INVASIVE CV LAB;  Service: Cardiovascular;  Laterality: N/A;   CORONARY ARTERY BYPASS GRAFT  02/06/08   X 3   SHOULDER ARTHROSCOPY WITH ROTATOR CUFF REPAIR AND SUBACROMIAL DECOMPRESSION Right 09/21/2018   Procedure: RIGHT SHOULDER ARTHROSCOPY WITH EXTENSIVE DEBRIDEMENT, BICEPS TENODESIS, SUBACROMIAL DECOMPRESSION,  ROTATOR CUFF REPAIR;  Surgeon: Tarry Kos, MD;  Location: [facility];  Service: Orthopedics;  Laterality: Right;   SHOULDER ARTHROSCOPY WITH SUBACROMIAL DECOMPRESSION Left 03/27/2020   Procedure: LEFT SHOULDER ARTHROSCOPY WITH EXTENSIVE DEBRIDEMENT LABRUM AND BICEPS TENDON, SUBACROMIAL DECOMPRESSION, LYSIS OF ADHESIONS, MANIPULATION UNDER ANESTHESIA;  Surgeon: Tarry Kos, MD;  Location:  SURGERY CENTER;  Service: Orthopedics;  Laterality: Left;   Patient Active Problem List   Diagnosis Date Noted   Adhesive capsulitis of left shoulder 03/27/2020   Loose body in shoulder joint,  left 03/27/2020   Impingement syndrome of left shoulder 03/27/2020   Degenerative tear of glenoid labrum of left shoulder 03/27/2020   Coronary artery disease    PAF (paroxysmal atrial fibrillation) (HCC)    Acute ST elevation myocardial infarction (STEMI) of inferior wall (HCC) 02/24/2016   HLD (hyperlipidemia) 08/19/2010    PCP: Cleatis Polka, MD  REFERRING PROVIDER: Tarry Kos, MD  REFERRING DIAG:  Diagnosis  M54.50,G89.29 (ICD-10-CM) - Chronic low back pain, unspecified back pain laterality, unspecified whether sciatica present    Rationale for Evaluation and Treatment: Rehabilitation  THERAPY DIAG:  No diagnosis found.  ONSET DATE: ***  SUBJECTIVE:  SUBJECTIVE STATEMENT: ***  PERTINENT HISTORY:  CAD, cervical DDD, HLD, CABG x 3, several cardiac cateterizations, Rt and Lt RTC repair, LT shoulder labrum tear  PAIN:  Are you having pain? {OPRCPAIN:27236}  PRECAUTIONS: {Therapy precautions:24002}  RED FLAGS: {PT Red Flags:29287}   WEIGHT BEARING RESTRICTIONS: {Yes ***/No:24003}  FALLS:  Has patient fallen in last 6 months? {fallsyesno:27318}  LIVING ENVIRONMENT: Lives with: {OPRC lives with:25569::"lives with their family"} Lives in: {Lives in:25570} Stairs: {opstairs:27293} Has following equipment at home: {Assistive devices:23999}  OCCUPATION: ***  PLOF: {PLOF:24004}  PATIENT GOALS: ***  NEXT MD VISIT: ***  OBJECTIVE:  Note: Objective measures were completed at Evaluation unless otherwise noted.  DIAGNOSTIC FINDINGS:  ***  PATIENT SURVEYS:  FOTO ***  COGNITION: Overall cognitive status: {cognition:24006}     SENSATION: {sensation:27233}  MUSCLE LENGTH: Hamstrings: Right *** deg; Left *** deg Thomas test: Right *** deg; Left *** deg  POSTURE:  {posture:25561}  PALPATION: ***  LUMBAR ROM:   AROM Left/Right 03/11/2023  Flexion   Extension   Right lateral flexion   Left lateral flexion   Right rotation   Left rotation    (Blank rows = not tested)  LOWER EXTREMITY ROM:     {AROM/PROM:27142}  Left/Right 03/11/2023   Hip flexion    Hip extension    Hip abduction    Hip adduction    Hip internal rotation    Hip external rotation    Knee flexion    Knee extension    Ankle dorsiflexion    Ankle plantarflexion    Ankle inversion    Ankle eversion     (Blank rows = not tested)  LOWER EXTREMITY STRENGTH:    MMT Left/Right 03/11/2023   Hip flexion    Hip extension    Hip abduction    Hip adduction    Hip internal rotation    Hip external rotation    Knee flexion    Knee extension    Ankle dorsiflexion    Ankle plantarflexion    Ankle inversion    Ankle eversion     (Blank rows = not tested)  LUMBAR SPECIAL TESTS:  {lumbar special test:25242}  FUNCTIONAL TESTS:  {Functional tests:24029}  GAIT: Distance walked: *** Assistive device utilized: {Assistive devices:23999} Level of assistance: {Levels of assistance:24026} Comments: ***  TODAY'S TREATMENT:                                                                                                                              DATE: 03/11/2023 ***    PATIENT EDUCATION:  Education details: *** Person educated: {Person educated:25204} Education method: {Education Method:25205} Education comprehension: {Education Comprehension:25206}  HOME EXERCISE PROGRAM: ***  ASSESSMENT:  CLINICAL IMPRESSION: Patient is a 62 y.o. male who was seen today for physical therapy evaluation and treatment for  Diagnosis  M54.50,G89.29 (ICD-10-CM) - Chronic low back pain, unspecified back pain laterality, unspecified whether sciatica present  .  ***  OBJECTIVE IMPAIRMENTS: {opptimpairments:25111}.  ACTIVITY LIMITATIONS: {activitylimitations:27494}  PARTICIPATION  LIMITATIONS: {participationrestrictions:25113}  PERSONAL FACTORS: CAD, cervical DDD, HLD, CABG x 3, several cardiac cateterizations, Rt and Lt RTC repair, LT shoulder labrum tear are also affecting patient's functional outcome.   REHAB POTENTIAL: {rehabpotential:25112}  CLINICAL DECISION MAKING: {clinical decision making:25114}  EVALUATION COMPLEXITY: {Evaluation complexity:25115}   GOALS: Goals reviewed with patient? Yes  SHORT TERM GOALS: Target date: 04/08/2023  *** Baseline: Goal status: INITIAL  2.  *** Baseline:  Goal status: INITIAL  3.  *** Baseline:  Goal status: INITIAL  4.  *** Baseline:  Goal status: INITIAL  5.  *** Baseline:  Goal status: INITIAL  6.  *** Baseline:  Goal status: INITIAL  LONG TERM GOALS: Target date: 05/06/2023  *** Baseline:  Goal status: INITIAL  2.  *** Baseline:  Goal status: INITIAL  3.  *** Baseline:  Goal status: INITIAL  4.  *** Baseline:  Goal status: INITIAL  5.  *** Baseline:  Goal status: INITIAL  6.  *** Baseline:  Goal status: INITIAL  PLAN:  PT FREQUENCY: {rehab frequency:25116}  PT DURATION: {rehab duration:25117}  PLANNED INTERVENTIONS: {rehab planned interventions:25118::"97110-Therapeutic exercises","97530- Therapeutic [phone]- Neuromuscular re-education","97535- Self VHQI","69629- Manual therapy"}.  PLAN FOR NEXT SESSION: Cherlyn Cushing, PT, MPT 03/11/2023, 9:34 AM

## 2023-03-16 ENCOUNTER — Telehealth: Payer: Self-pay | Admitting: *Deleted

## 2023-03-16 NOTE — Telephone Encounter (Signed)
   Pre-operative Risk Assessment    Patient Name: Jacob Gibbs  DOB: 24-May-1960 MRN: [id number]  DATE OF LAST VISIT: 10/09/22 Eligha Bridegroom, NP DATE OF NEXT VISIT: NONE    Request for Surgical Clearance    Procedure:   LEFT L5-S1 IL ESI  Date of Surgery:  Clearance TBD                                 Surgeon:  DR. Jerel Shepherd Group or Practice Name:  [facility] AT [facility] Phone number:  [phone] Fax number:  [phone] ATTN: DR. Richardine Service    Type of Clearance Requested:   - Pharmacy:  Hold Clopidogrel (Plavix) x 7 DAYS PRIOR   Type of Anesthesia:  Not Indicated   Additional requests/questions:    Elpidio Anis   03/16/2023, 12:06 PM

## 2023-03-17 NOTE — Telephone Encounter (Signed)
   Name: Jacob Gibbs  DOB: September 03, 1960  MRN: [id number]  Primary Cardiologist: Verne Carrow, MD   Preoperative team, please contact this patient and set up a phone call appointment for further preoperative risk assessment. Please obtain consent and complete medication review. Thank you for your help.  I confirm that guidance regarding antiplatelet and oral anticoagulation therapy has been completed and, if necessary, noted below.  His Plavix may be held for 7 days prior to his procedure.  Please resume as soon as hemostasis is achieved.  I also confirmed the patient resides in the state of [place]. As per [facility] Medical Board telemedicine laws, the patient must reside in the state in which the provider is licensed.   Ronney Asters, NP 03/17/2023, 8:10 AM [facility]

## 2023-03-17 NOTE — Telephone Encounter (Signed)
Patient states that he is unsure if he wants to have upcoming procedure due to no longer being in pain. Pt states that he will call our office if decides to have procedure. Will remove from the pool until patient calls back

## 2023-04-01 ENCOUNTER — Telehealth: Payer: Self-pay | Admitting: *Deleted

## 2023-04-01 ENCOUNTER — Telehealth: Payer: Self-pay | Admitting: Cardiovascular Disease

## 2023-04-01 NOTE — Telephone Encounter (Signed)
 Earl Lagos K11 minutes ago (8:25 AM)   AF Patient called to inform us he has decided to have the procedure (see 03/16/23 encounter). Please advise.      Note   Ignacio, Lowder [phone]  Tawni Millers

## 2023-04-01 NOTE — Telephone Encounter (Signed)
 Patient called to inform us he has decided to have the procedure (see 03/16/23 encounter). Please advise.

## 2023-04-01 NOTE — Telephone Encounter (Signed)
 I s/w the pt this morning who tells me that he has decided to proceed with his procedure. I informed the pt that the first tele pre op appt I have is 04/14/23. Pt agreed. Pt asked could he be put on a waitlist incase of a cancellation. I assured the pt that I will out him on a waitlist as well.  Med rec and consent are done.      Patient Consent for Virtual Visit        KYRIAN STAGE has provided verbal consent on 04/01/2023 for a virtual visit (video or telephone).   CONSENT FOR VIRTUAL VISIT FOR:  Arley LITTIE Pun  By participating in this virtual visit I agree to the following:  I hereby voluntarily request, consent and authorize [facility] and its employed or contracted physicians, physician assistants, nurse practitioners or other licensed health care professionals (the Practitioner), to provide me with telemedicine health care services (the "Services) as deemed necessary by the treating Practitioner. I acknowledge and consent to receive the Services by the Practitioner via telemedicine. I understand that the telemedicine visit will involve communicating with the Practitioner through live audiovisual communication technology and the disclosure of certain medical information by electronic transmission. I acknowledge that I have been given the opportunity to request an in-person assessment or other available alternative prior to the telemedicine visit and am voluntarily participating in the telemedicine visit.  I understand that I have the right to withhold or withdraw my consent to the use of telemedicine in the course of my care at any time, without affecting my right to future care or treatment, and that the Practitioner or I may terminate the telemedicine visit at any time. I understand that I have the right to inspect all information obtained and/or recorded in the course of the telemedicine visit and may receive copies of available information for a reasonable fee.  I  understand that some of the potential risks of receiving the Services via telemedicine include:  Delay or interruption in medical evaluation due to technological equipment failure or disruption; Information transmitted may not be sufficient (e.g. poor resolution of images) to allow for appropriate medical decision making by the Practitioner; and/or  In rare instances, security protocols could fail, causing a breach of personal health information.  Furthermore, I acknowledge that it is my responsibility to provide information about my medical history, conditions and care that is complete and accurate to the best of my ability. I acknowledge that Practitioner's advice, recommendations, and/or decision may be based on factors not within their control, such as incomplete or inaccurate data provided by me or distortions of diagnostic images or specimens that may result from electronic transmissions. I understand that the practice of medicine is not an exact science and that Practitioner makes no warranties or guarantees regarding treatment outcomes. I acknowledge that a copy of this consent can be made available to me via my patient portal [facility] MyChart), or I can request a printed copy by calling the office of [facility].    I understand that my insurance will be billed for this visit.   I have read or had this consent read to me. I understand the contents of this consent, which adequately explains the benefits and risks of the Services being provided via telemedicine.  I have been provided ample opportunity to ask questions regarding this consent and the Services and have had my questions answered to my satisfaction. I give my informed consent for the  services to be provided through the use of telemedicine in my medical care

## 2023-04-01 NOTE — Telephone Encounter (Signed)
 We will reach out to the pt to schedule a tele pre op appt.

## 2023-04-01 NOTE — Telephone Encounter (Signed)
 I s/w the pt this morning who tells me that he has decided to proceed with his procedure. I informed the pt that the first tele pre op appt I have is 04/14/23. Pt agreed. Pt asked could he be put on a waitlist incase of a cancellation. I assured the pt that I will out him on a waitlist as well.  Med rec and consent are done.

## 2023-04-02 NOTE — Telephone Encounter (Signed)
 Surgeon office sent duplicate though with an update surgeon is now listed as Dr. Kendell Bane Dawley.    Also anesthesia is General. I will update Dr. Jake Samples with notes.

## 2023-04-05 ENCOUNTER — Encounter: Payer: Self-pay | Admitting: Cardiovascular Disease

## 2023-04-13 NOTE — Progress Notes (Signed)
 Virtual Visit via Telephone Note   Because of Jacob Gibbs's co-morbid illnesses, he is at least at moderate risk for complications without adequate follow up.  This format is felt to be most appropriate for this patient at this time.  The patient did not have access to video technology/had technical difficulties with video requiring transitioning to audio format only (telephone).  All issues noted in this document were discussed and addressed.  No physical exam could be performed with this format.  Please refer to the patient's chart for his consent to telehealth for [facility].  Evaluation Performed:  Preoperative cardiovascular risk assessment _____________   Date:  04/14/2023   Patient ID:  Jacob Gibbs, DOB [birth year], MRN [id number] Patient Location:  Home Provider location:   Office  Primary Care Provider:  Jeannine Milroy., MD Primary Cardiologist:  Antoinette Batman, MD  Chief Complaint / Patient Profile   63 y.o. y/o male with a h/o paroxysmal atrial fibrillation, hyperlipidemia, CAD status post CABG x 3 in 2009, with stent placement to SVG to PDA 01/2016, OSA.   He is pending left L5-S1, IL, ESI with Dr. Daisey Dryer on date to be determined with recommendations to hold Plavix  for 7 days prior to procedure, and presents today for telephonic preoperative cardiovascular risk assessment.  History of Present Illness    Jacob Gibbs is a 63 y.o. male who presents via audio/video conferencing for a telehealth visit today.  Pt was last seen in cardiology clinic on 10/09/2022 by Slater Duncan, NP.  At that time Jacob Gibbs was doing well he was to continue secondary prevention and management of lipids.  The patient is now pending procedure as outlined above. Since his last visit, he has done well from a cardiac standpoint.  He is medically compliant.  He denies any bleeding on Plavix .  He is very physically active and has been traveling a good bit.    Past Medical History    Past Medical History:  Diagnosis Date   Anxiety    Coronary artery disease    a. 2009: mutivessel CAD s/p CABG   b. 01/2016: inf STEMI s/p DES to RCA   DDD (degenerative disc disease), cervical    History of tobacco use    REMOTE   Hyperlipidemia    PAF (paroxysmal atrial fibrillation) (HCC)    a. brief episode in dec 2005 wtih no recurrence. No indication for [facility]   Sleep apnea    uses cpap   Syncope and collapse    Past Surgical History:  Procedure Laterality Date   CARDIAC CATHETERIZATION N/A 02/24/2016   Procedure: Coronary Stent Intervention;  Surgeon: Arnoldo Lapping, MD;  Location: [facility] INVASIVE CV LAB;  Service: Cardiovascular;  Laterality: N/A;   CARDIAC CATHETERIZATION N/A 02/24/2016   Procedure: Left Heart Cath and Cors/Grafts Angiography;  Surgeon: Arnoldo Lapping, MD;  Location: [facility] INVASIVE CV LAB;  Service: Cardiovascular;  Laterality: N/A;   CORONARY ARTERY BYPASS GRAFT  02/06/08   X 3   SHOULDER ARTHROSCOPY WITH ROTATOR CUFF REPAIR AND SUBACROMIAL DECOMPRESSION Right 09/21/2018   Procedure: RIGHT SHOULDER ARTHROSCOPY WITH EXTENSIVE DEBRIDEMENT, BICEPS TENODESIS, SUBACROMIAL DECOMPRESSION,  ROTATOR CUFF REPAIR;  Surgeon: Wes Hamman, MD;  Location: [facility];  Service: Orthopedics;  Laterality: Right;   SHOULDER ARTHROSCOPY WITH SUBACROMIAL DECOMPRESSION Left 03/27/2020   Procedure: LEFT SHOULDER ARTHROSCOPY WITH EXTENSIVE DEBRIDEMENT LABRUM AND BICEPS TENDON, SUBACROMIAL DECOMPRESSION, LYSIS OF ADHESIONS, MANIPULATION UNDER ANESTHESIA;  Surgeon: Wes Hamman,  MD;  Location: [facility];  Service: Orthopedics;  Laterality: Left;    Allergies  Allergies  Allergen Reactions   Contrast Media [Iodinated Contrast Media] Other (See Comments)    Face turned scaly (1989 CT).  Had MR arthrogram 03/07/18 (w/ CT contrast) with no prep and no problems.    Home Medications    Prior to Admission medications   Medication  Sig Start Date End Date Taking? Authorizing Provider  amoxicillin -clavulanate (AUGMENTIN ) 875-125 MG tablet Take 1 tablet by mouth every 12 (twelve) hours. 12/26/22   Blitch, Dasie Epps, NP  clopidogrel  (PLAVIX ) 75 MG tablet Take 1 tablet (75 mg total) by mouth daily. 10/09/22   Swinyer, Leilani Punter, NP  cyclobenzaprine  (FLEXERIL ) 5 MG tablet Take 1-2 tablets (5-10 mg total) by mouth at bedtime as needed for muscle spasms. 02/23/23   Wes Hamman, MD  Evolocumab  (REPATHA  SURECLICK) 140 MG/ML SOAJ Inject 140 mg into the skin every 14 (fourteen) days. 10/09/22   Swinyer, Leilani Punter, NP  ezetimibe  (ZETIA ) 10 MG tablet TAKE 1 TABLET(10 MG) BY MOUTH DAILY 11/09/22   Odie Benne, MD  gabapentin  (NEURONTIN ) 100 MG capsule Take 1-2 capsules (100-200 mg total) by mouth 3 (three) times daily as needed. 02/23/23   Wes Hamman, MD  methocarbamol  (ROBAXIN ) 750 MG tablet Take 1 tablet (750 mg total) by mouth 2 (two) times daily as needed for muscle spasms. 03/27/20   Wes Hamman, MD  metoprolol  tartrate (LOPRESSOR ) 25 MG tablet TAKE 1/2 TABLET BY MOUTH TWICE DAILY 11/03/22   Odie Benne, MD  nitroGLYCERIN  (NITROSTAT ) 0.4 MG SL tablet Place 1 tablet (0.4 mg total) under the tongue every 5 (five) minutes x 3 doses as needed for chest pain. 10/09/22   Swinyer, Leilani Punter, NP  omega-3 acid ethyl esters (LOVAZA ) 1 g capsule TAKE 1 CAPSULE BY MOUTH TWICE DAILY 02/02/23   Odie Benne, MD  pantoprazole  (PROTONIX ) 20 MG tablet Take 1 tablet (20 mg total) by mouth daily. 12/26/22   Blitch, Dasie Epps, NP  predniSONE  (STERAPRED UNI-PAK 21 TAB) 10 MG (21) TBPK tablet Take as directed Patient not taking: Reported on 04/01/2023 02/23/23   Wes Hamman, MD  rosuvastatin  (CRESTOR ) 10 MG tablet Take 1 tablet (10 mg total) by mouth daily. 10/09/22   Swinyer, Leilani Punter, NP  testosterone  cypionate (DEPOTESTOSTERONE CYPIONATE) 200 MG/ML injection Inject 100 mg into the muscle every Tuesday.    [provider]  traMADol  (ULTRAM ) 50 MG tablet Take 1 tablet (50 mg total) by mouth every 6 (six) hours as needed. Patient not taking: Reported on 04/01/2023 04/03/20   Sandie Cross, PA-C  traMADol  (ULTRAM ) 50 MG tablet Take 1-2 tablets (50-100 mg total) by mouth every 12 (twelve) hours as needed. 02/23/23   Wes Hamman, MD    Physical Exam    Vital Signs:  Jacob Gibbs does not have vital signs available for review today.  Given telephonic nature of communication, physical exam is limited. AAOx3. NAD. Normal affect.  Speech and respirations are unlabored.  Accessory Clinical Findings    None  Assessment & Plan    1.  Preoperative Cardiovascular Risk Assessment: According to the Revised Cardiac Risk Index (RCRI), his Perioperative Risk of Major Cardiac Event is (%): 0.4  His Functional Capacity in METs is: 9.89 according to the [employer] Activity Status Index (DASI).   The patient was advised that if he develops new symptoms prior to surgery to contact our  office to arrange for a follow-up visit, and he verbalized understandin  Per office protocol, if patient is without any new symptoms or concerns at the time of their virtual visit, he may hold Plavix  for 5 days prior to procedure. Please resume lab X as soon as possible postprocedure, at the discretion of the surgeon.  Patient verbalized understanding.   A copy of this note will be routed to requesting surgeon.  Time:   Today, I have spent 10 minutes with the patient with telehealth technology discussing medical history, symptoms, and management plan.     Friddie Jetty, NP  04/14/2023, 10:00 AM

## 2023-04-14 ENCOUNTER — Ambulatory Visit: Payer: No Typology Code available for payment source | Attending: Cardiovascular Disease

## 2023-04-14 DIAGNOSIS — Z01810 Encounter for preprocedural cardiovascular examination: Secondary | ICD-10-CM | POA: Diagnosis not present

## 2023-04-14 DIAGNOSIS — Z01818 Encounter for other preprocedural examination: Secondary | ICD-10-CM

## 2023-04-28 DIAGNOSIS — M5451 Vertebrogenic low back pain: Secondary | ICD-10-CM | POA: Diagnosis not present

## 2023-04-28 DIAGNOSIS — M5416 Radiculopathy, lumbar region: Secondary | ICD-10-CM | POA: Diagnosis not present

## 2023-04-29 DIAGNOSIS — E785 Hyperlipidemia, unspecified: Secondary | ICD-10-CM | POA: Diagnosis not present

## 2023-04-29 DIAGNOSIS — E291 Testicular hypofunction: Secondary | ICD-10-CM | POA: Diagnosis not present

## 2023-04-29 DIAGNOSIS — R7301 Impaired fasting glucose: Secondary | ICD-10-CM | POA: Diagnosis not present

## 2023-04-29 DIAGNOSIS — Z125 Encounter for screening for malignant neoplasm of prostate: Secondary | ICD-10-CM | POA: Diagnosis not present

## 2023-04-29 DIAGNOSIS — I2510 Atherosclerotic heart disease of native coronary artery without angina pectoris: Secondary | ICD-10-CM | POA: Diagnosis not present

## 2023-04-30 DIAGNOSIS — M5451 Vertebrogenic low back pain: Secondary | ICD-10-CM | POA: Diagnosis not present

## 2023-05-02 DIAGNOSIS — E669 Obesity, unspecified: Secondary | ICD-10-CM | POA: Diagnosis not present

## 2023-05-05 DIAGNOSIS — M5451 Vertebrogenic low back pain: Secondary | ICD-10-CM | POA: Diagnosis not present

## 2023-05-06 DIAGNOSIS — G4733 Obstructive sleep apnea (adult) (pediatric): Secondary | ICD-10-CM | POA: Diagnosis not present

## 2023-05-06 DIAGNOSIS — Z1211 Encounter for screening for malignant neoplasm of colon: Secondary | ICD-10-CM | POA: Diagnosis not present

## 2023-05-06 DIAGNOSIS — Z955 Presence of coronary angioplasty implant and graft: Secondary | ICD-10-CM | POA: Diagnosis not present

## 2023-05-06 DIAGNOSIS — Z0000 Encounter for general adult medical examination without abnormal findings: Secondary | ICD-10-CM | POA: Diagnosis not present

## 2023-05-06 DIAGNOSIS — I2510 Atherosclerotic heart disease of native coronary artery without angina pectoris: Secondary | ICD-10-CM | POA: Diagnosis not present

## 2023-05-06 DIAGNOSIS — M5416 Radiculopathy, lumbar region: Secondary | ICD-10-CM | POA: Diagnosis not present

## 2023-05-06 DIAGNOSIS — E669 Obesity, unspecified: Secondary | ICD-10-CM | POA: Diagnosis not present

## 2023-05-06 DIAGNOSIS — R82998 Other abnormal findings in urine: Secondary | ICD-10-CM | POA: Diagnosis not present

## 2023-05-06 DIAGNOSIS — E785 Hyperlipidemia, unspecified: Secondary | ICD-10-CM | POA: Diagnosis not present

## 2023-05-06 DIAGNOSIS — Z1331 Encounter for screening for depression: Secondary | ICD-10-CM | POA: Diagnosis not present

## 2023-05-06 DIAGNOSIS — R7301 Impaired fasting glucose: Secondary | ICD-10-CM | POA: Diagnosis not present

## 2023-05-06 DIAGNOSIS — Z1339 Encounter for screening examination for other mental health and behavioral disorders: Secondary | ICD-10-CM | POA: Diagnosis not present

## 2023-05-06 DIAGNOSIS — E291 Testicular hypofunction: Secondary | ICD-10-CM | POA: Diagnosis not present

## 2023-05-08 ENCOUNTER — Encounter: Payer: Self-pay | Admitting: Cardiovascular Disease

## 2023-05-13 DIAGNOSIS — G4733 Obstructive sleep apnea (adult) (pediatric): Secondary | ICD-10-CM | POA: Diagnosis not present

## 2023-05-13 DIAGNOSIS — Z955 Presence of coronary angioplasty implant and graft: Secondary | ICD-10-CM | POA: Diagnosis not present

## 2023-05-13 DIAGNOSIS — R7301 Impaired fasting glucose: Secondary | ICD-10-CM | POA: Diagnosis not present

## 2023-05-13 DIAGNOSIS — K219 Gastro-esophageal reflux disease without esophagitis: Secondary | ICD-10-CM | POA: Diagnosis not present

## 2023-05-13 DIAGNOSIS — I480 Paroxysmal atrial fibrillation: Secondary | ICD-10-CM | POA: Diagnosis not present

## 2023-05-13 DIAGNOSIS — E785 Hyperlipidemia, unspecified: Secondary | ICD-10-CM | POA: Diagnosis not present

## 2023-05-13 DIAGNOSIS — E8881 Metabolic syndrome: Secondary | ICD-10-CM | POA: Diagnosis not present

## 2023-05-13 DIAGNOSIS — I2510 Atherosclerotic heart disease of native coronary artery without angina pectoris: Secondary | ICD-10-CM | POA: Diagnosis not present

## 2023-05-13 DIAGNOSIS — E669 Obesity, unspecified: Secondary | ICD-10-CM | POA: Diagnosis not present

## 2023-05-14 DIAGNOSIS — M5416 Radiculopathy, lumbar region: Secondary | ICD-10-CM | POA: Diagnosis not present

## 2023-05-14 DIAGNOSIS — M5451 Vertebrogenic low back pain: Secondary | ICD-10-CM | POA: Diagnosis not present

## 2023-05-19 DIAGNOSIS — M5451 Vertebrogenic low back pain: Secondary | ICD-10-CM | POA: Diagnosis not present

## 2023-05-19 DIAGNOSIS — M5416 Radiculopathy, lumbar region: Secondary | ICD-10-CM | POA: Diagnosis not present

## 2023-05-30 DIAGNOSIS — E669 Obesity, unspecified: Secondary | ICD-10-CM | POA: Diagnosis not present

## 2023-06-04 DIAGNOSIS — M5451 Vertebrogenic low back pain: Secondary | ICD-10-CM | POA: Diagnosis not present

## 2023-06-04 DIAGNOSIS — M5416 Radiculopathy, lumbar region: Secondary | ICD-10-CM | POA: Diagnosis not present

## 2023-06-11 DIAGNOSIS — M5416 Radiculopathy, lumbar region: Secondary | ICD-10-CM | POA: Diagnosis not present

## 2023-06-11 DIAGNOSIS — M5451 Vertebrogenic low back pain: Secondary | ICD-10-CM | POA: Diagnosis not present

## 2023-06-24 DIAGNOSIS — Z1211 Encounter for screening for malignant neoplasm of colon: Secondary | ICD-10-CM | POA: Diagnosis not present

## 2023-06-30 DIAGNOSIS — E669 Obesity, unspecified: Secondary | ICD-10-CM | POA: Diagnosis not present

## 2023-07-12 DIAGNOSIS — E291 Testicular hypofunction: Secondary | ICD-10-CM | POA: Diagnosis not present

## 2023-07-12 DIAGNOSIS — D751 Secondary polycythemia: Secondary | ICD-10-CM | POA: Diagnosis not present

## 2023-07-12 DIAGNOSIS — N5201 Erectile dysfunction due to arterial insufficiency: Secondary | ICD-10-CM | POA: Diagnosis not present

## 2023-07-30 DIAGNOSIS — E669 Obesity, unspecified: Secondary | ICD-10-CM | POA: Diagnosis not present

## 2023-08-09 DIAGNOSIS — K219 Gastro-esophageal reflux disease without esophagitis: Secondary | ICD-10-CM | POA: Diagnosis not present

## 2023-08-09 DIAGNOSIS — E669 Obesity, unspecified: Secondary | ICD-10-CM | POA: Diagnosis not present

## 2023-08-09 DIAGNOSIS — R300 Dysuria: Secondary | ICD-10-CM | POA: Diagnosis not present

## 2023-08-09 DIAGNOSIS — R7301 Impaired fasting glucose: Secondary | ICD-10-CM | POA: Diagnosis not present

## 2023-08-09 DIAGNOSIS — E8881 Metabolic syndrome: Secondary | ICD-10-CM | POA: Diagnosis not present

## 2023-08-09 DIAGNOSIS — I2510 Atherosclerotic heart disease of native coronary artery without angina pectoris: Secondary | ICD-10-CM | POA: Diagnosis not present

## 2023-08-09 DIAGNOSIS — E785 Hyperlipidemia, unspecified: Secondary | ICD-10-CM | POA: Diagnosis not present

## 2023-08-09 DIAGNOSIS — N390 Urinary tract infection, site not specified: Secondary | ICD-10-CM | POA: Diagnosis not present

## 2023-08-09 DIAGNOSIS — R319 Hematuria, unspecified: Secondary | ICD-10-CM | POA: Diagnosis not present

## 2023-08-09 DIAGNOSIS — R509 Fever, unspecified: Secondary | ICD-10-CM | POA: Diagnosis not present

## 2023-08-09 DIAGNOSIS — I480 Paroxysmal atrial fibrillation: Secondary | ICD-10-CM | POA: Diagnosis not present

## 2023-08-09 DIAGNOSIS — Z955 Presence of coronary angioplasty implant and graft: Secondary | ICD-10-CM | POA: Diagnosis not present

## 2023-08-13 ENCOUNTER — Encounter: Payer: Self-pay | Admitting: Internal Medicine

## 2023-08-13 ENCOUNTER — Other Ambulatory Visit (HOSPITAL_COMMUNITY): Payer: Self-pay | Admitting: Internal Medicine

## 2023-08-13 ENCOUNTER — Ambulatory Visit (HOSPITAL_BASED_OUTPATIENT_CLINIC_OR_DEPARTMENT_OTHER)
Admission: RE | Admit: 2023-08-13 | Discharge: 2023-08-13 | Disposition: A | Discharge status: Home / Self Care | Source: Ambulatory Visit | Attending: Internal Medicine | Admitting: Internal Medicine

## 2023-08-13 DIAGNOSIS — N390 Urinary tract infection, site not specified: Secondary | ICD-10-CM | POA: Insufficient documentation

## 2023-08-30 DIAGNOSIS — E669 Obesity, unspecified: Secondary | ICD-10-CM | POA: Diagnosis not present

## 2023-09-29 DIAGNOSIS — E669 Obesity, unspecified: Secondary | ICD-10-CM | POA: Diagnosis not present

## 2023-10-05 ENCOUNTER — Other Ambulatory Visit: Payer: Self-pay | Admitting: Nurse Practitioner

## 2023-10-05 DIAGNOSIS — I25810 Atherosclerosis of coronary artery bypass graft(s) without angina pectoris: Secondary | ICD-10-CM

## 2023-10-05 DIAGNOSIS — E7800 Pure hypercholesterolemia, unspecified: Secondary | ICD-10-CM

## 2023-10-30 DIAGNOSIS — E669 Obesity, unspecified: Secondary | ICD-10-CM | POA: Diagnosis not present

## 2023-11-10 ENCOUNTER — Other Ambulatory Visit: Payer: Self-pay | Admitting: Nurse Practitioner

## 2023-11-11 ENCOUNTER — Other Ambulatory Visit: Payer: Self-pay

## 2023-11-11 MED ORDER — METOPROLOL TARTRATE 25 MG PO TABS: ORAL_TABLET | ORAL | 0 refills | Status: DC

## 2023-11-25 ENCOUNTER — Other Ambulatory Visit: Payer: Self-pay | Admitting: Cardiovascular Disease

## 2023-11-30 DIAGNOSIS — E669 Obesity, unspecified: Secondary | ICD-10-CM | POA: Diagnosis not present

## 2023-12-13 ENCOUNTER — Other Ambulatory Visit: Payer: Self-pay | Admitting: Cardiovascular Disease

## 2023-12-13 ENCOUNTER — Other Ambulatory Visit: Payer: Self-pay | Admitting: Nurse Practitioner

## 2023-12-27 ENCOUNTER — Other Ambulatory Visit: Payer: Self-pay | Admitting: Cardiovascular Disease

## 2023-12-30 DIAGNOSIS — E669 Obesity, unspecified: Secondary | ICD-10-CM | POA: Diagnosis not present

## 2024-01-05 ENCOUNTER — Other Ambulatory Visit: Payer: Self-pay | Admitting: Cardiovascular Disease

## 2024-01-05 ENCOUNTER — Other Ambulatory Visit: Payer: Self-pay | Admitting: Nurse Practitioner

## 2024-01-06 ENCOUNTER — Telehealth: Payer: Self-pay | Admitting: Cardiovascular Disease

## 2024-01-06 NOTE — Telephone Encounter (Signed)
*  STAT* If patient is at the pharmacy, call can be transferred to refill team.   1. Which medications need to be refilled? (please list name of each medication and dose if known)   clopidogrel  (PLAVIX ) 75 MG tablet    2. Which pharmacy/location (including street and city if local pharmacy) is medication to be sent to? [id number] - [place], [place] - [address]   3. Do they need a 30 day or 90 day supply? 90   Patient has appt on 10/21

## 2024-01-07 ENCOUNTER — Other Ambulatory Visit: Payer: Self-pay | Admitting: Cardiovascular Disease

## 2024-01-17 NOTE — Progress Notes (Unsigned)
 No chief complaint on file.  History of Present Illness: 63 yo male with history of paroxysmal atrial fibrillation, HLD, sleep apnea and CAD s/p 3V CABG in 2009 here today for cardiac follow up. He underwent 3V CABG in 2009 (RIMA to LAD, LIMA to OM3, SVG to RCA). Echo December 2015 with normal LV size and function, mild valve disease. He was admitted to [facility] November 2017 via EMS with c/o left shoulder pain. Code STEMI called. Emergent cardiac cath with severe diffuse disease in the SVG to PDA. A drug eluting stent was placed in the native RCA. LVEF by LV gram 55% with subtle hypokinesis of the inferior wall. Echo February 2021 with LVEF=60-65%, mild MR.   He is here today for follow up. The patient denies any chest pain, dyspnea, palpitations, lower extremity edema, orthopnea, PND, dizziness, near syncope or syncope.    Primary Care Physician: Loreli Elsie JONETTA Mickey., MD  Past Medical History:  Diagnosis Date   Anxiety    Coronary artery disease    a. 2009: mutivessel CAD s/p CABG   b. 01/2016: inf STEMI s/p DES to RCA   DDD (degenerative disc disease), cervical    History of tobacco use    REMOTE   Hyperlipidemia    PAF (paroxysmal atrial fibrillation) (HCC)    a. brief episode in dec 2005 wtih no recurrence. No indication for [facility]   Sleep apnea    uses cpap   Syncope and collapse     Past Surgical History:  Procedure Laterality Date   CARDIAC CATHETERIZATION N/A 02/24/2016   Procedure: Coronary Stent Intervention;  Surgeon: Ozell Fell, MD;  Location: [facility] INVASIVE CV LAB;  Service: Cardiovascular;  Laterality: N/A;   CARDIAC CATHETERIZATION N/A 02/24/2016   Procedure: Left Heart Cath and Cors/Grafts Angiography;  Surgeon: Ozell Fell, MD;  Location: [facility] INVASIVE CV LAB;  Service: Cardiovascular;  Laterality: N/A;   CORONARY ARTERY BYPASS GRAFT  02/06/08   X 3   SHOULDER ARTHROSCOPY WITH ROTATOR CUFF REPAIR AND SUBACROMIAL DECOMPRESSION Right 09/21/2018   Procedure: RIGHT SHOULDER  ARTHROSCOPY WITH EXTENSIVE DEBRIDEMENT, BICEPS TENODESIS, SUBACROMIAL DECOMPRESSION,  ROTATOR CUFF REPAIR;  Surgeon: Jerri Kay HERO, MD;  Location: [facility];  Service: Orthopedics;  Laterality: Right;   SHOULDER ARTHROSCOPY WITH SUBACROMIAL DECOMPRESSION Left 03/27/2020   Procedure: LEFT SHOULDER ARTHROSCOPY WITH EXTENSIVE DEBRIDEMENT LABRUM AND BICEPS TENDON, SUBACROMIAL DECOMPRESSION, LYSIS OF ADHESIONS, MANIPULATION UNDER ANESTHESIA;  Surgeon: Jerri Kay HERO, MD;  Location: [facility];  Service: Orthopedics;  Laterality: Left;   Current Outpatient Medications  Medication Instructions   amoxicillin -clavulanate (AUGMENTIN ) 875-125 MG tablet 1 tablet, Oral, Every 12 hours   clopidogrel  (PLAVIX ) 75 MG tablet TAKE 1 TABLET(75 MG) BY MOUTH DAILY   cyclobenzaprine  (FLEXERIL ) 5-10 mg, Oral, At bedtime PRN   Evolocumab  (REPATHA  SURECLICK) 140 MG/ML SOAJ ADMINISTER 1 ML UNDER THE SKIN EVERY 14 DAYS   ezetimibe  (ZETIA ) 10 MG tablet TAKE 1 TABLET(10 MG) BY MOUTH DAILY   gabapentin  (NEURONTIN ) 100-200 mg, Oral, 3 times daily PRN   methocarbamol  (ROBAXIN ) 750 mg, Oral, 2 times daily PRN   metoprolol  tartrate (LOPRESSOR ) 12.5 mg, Oral, 2 times daily   nitroGLYCERIN  (NITROSTAT ) 0.4 MG SL tablet PLACE 1 TABLET UNDER THE TONGUE EVERY 5 MINUTES FOR 3 DOSES AS NEEDED FOR CHEST PAINS.   omega-3 acid ethyl esters (LOVAZA ) 1 g, Oral, 2 times daily   pantoprazole  (PROTONIX ) 20 mg, Oral, Daily   predniSONE  (STERAPRED UNI-PAK 21 TAB) 10 MG (21) TBPK  tablet Take as directed   rosuvastatin  (CRESTOR ) 10 MG tablet TAKE 1 TABLET(10 MG) BY MOUTH DAILY   testosterone  cypionate (DEPOTESTOSTERONE CYPIONATE) 100 mg, Every Tue   traMADol  (ULTRAM ) 50 mg, Oral, Every 6 hours PRN   traMADol  (ULTRAM ) 50-100 mg, Oral, Every 12 hours PRN    Allergies  Allergen Reactions   Contrast Media [Iodinated Contrast Media] Other (See Comments)    Face turned scaly (1989 CT).  Had MR arthrogram 03/07/18 (w/ CT  contrast) with no prep and no problems.    Social History   Socioeconomic History   Marital status: Married    Spouse name: Not on file   Number of children: Not on file   Years of education: Not on file   Highest education level: Not on file  Occupational History   Occupation: Firefighter  Tobacco Use   Smoking status: Former    Current packs/day: 0.00    Average packs/day: 0.5 packs/day for 20.0 years (10.0 ttl pk-yrs)    Types: Cigarettes    Start date: 01/07/1984    Quit date: 01/07/2004    Years since quitting: 20.0   Smokeless tobacco: Never   Tobacco comments:    Stopped 43yrs ago  Substance and Sexual Activity   Alcohol  use: Yes    Alcohol /week: 3.0 standard drinks of alcohol     Types: 3 Standard drinks or equivalent per week    Comment: OCCASIONAL   Drug use: No   Sexual activity: Not on file  Other Topics Concern   Not on file  Social History Narrative   MARRIED   TOBACCO USE ON AND OFF   ETOH OCCASIONALLY   USES HERBAL SUPP. ALONG WITH A DAILY VITAMIN PACK   HAD STRESS TEST APPROX 5 YRS   NEW ONSET AFIB   SYNCOPE   Social Drivers of Health   Financial Resource Strain: Not on file  Food Insecurity: Not on file  Transportation Needs: Not on file  Physical Activity: Not on file  Stress: Not on file  Social Connections: Not on file  Intimate Partner Violence: Not on file    Family History  Problem Relation Age of Onset   Coronary artery disease Mother        ALSO ONE OF HIS UNCLES    Review of Systems:  As stated in the HPI and otherwise negative.   There were no vitals taken for this visit.  Physical Examination: General: Well developed, well nourished, NAD  HEENT: OP clear, mucus membranes moist  SKIN: warm, dry. No rashes. Neuro: No focal deficits  Musculoskeletal: Muscle strength 5/5 all ext  Psychiatric: Mood and affect normal  Neck: No JVD, no carotid bruits, no thyromegaly, no lymphadenopathy.  Lungs:Clear bilaterally, no  wheezes, rhonci, crackles Cardiovascular: Regular rate and rhythm. No murmurs, gallops or rubs. Abdomen:Soft. Bowel sounds present. Non-tender.  Extremities: No lower extremity edema. Pulses are 2 + in the bilateral DP/PT.   EKG:  EKG is *** ordered today. The ekg ordered today demonstrates   Recent Labs: No results found for requested labs within last 365 days.   Lipid Panel    Component Value Date/Time   CHOL 102 04/27/2016 0907   TRIG 86 04/27/2016 0907   HDL 32 (L) 04/27/2016 0907   CHOLHDL 3.2 04/27/2016 0907   CHOLHDL 4.1 02/25/2016 0515   VLDL 13 02/25/2016 0515   LDLCALC 53 04/27/2016 0907     Wt Readings from Last 3 Encounters:  12/26/22 206 lb (93.4 kg)  10/09/22 212 lb (96.2 kg)  08/01/21 205 lb 6.4 oz (93.2 kg)      Assessment and Plan:   1. CAD s/p CABG without angina: He had CABG in 2009 and had an inferior STEMI in November 2017. A drug eluting stent was placed in the native RCA. No chest pain. Continue Plavix , beta blocker, statin and Zetia .   2. Paroxysmal atrial fibrillation: Remote episode but no known recurrence. Sinus today  3. HLD: LDL ***.  Lipids followed in primary care. Continue Crestor , Zetia  and Lovaza .   Labs/ tests ordered today include:  No orders of the defined types were placed in this encounter.  Disposition:   F/U with me in 12  months   Signed, Lonni Cash, MD 01/17/2024 8:53 PM    [facility] [address] [place] [address], [place], [place]  [address] Phone: [phone]; Fax: [phone]

## 2024-01-18 ENCOUNTER — Encounter: Payer: Self-pay | Admitting: Cardiovascular Disease

## 2024-01-18 ENCOUNTER — Ambulatory Visit: Attending: Cardiovascular Disease | Admitting: Cardiovascular Disease

## 2024-01-18 VITALS — BP 118/74 | HR 76 | Ht 69.0 in | Wt 200.0 lb

## 2024-01-18 DIAGNOSIS — I480 Paroxysmal atrial fibrillation: Secondary | ICD-10-CM

## 2024-01-18 DIAGNOSIS — I25810 Atherosclerosis of coronary artery bypass graft(s) without angina pectoris: Secondary | ICD-10-CM | POA: Diagnosis not present

## 2024-01-18 DIAGNOSIS — E7800 Pure hypercholesterolemia, unspecified: Secondary | ICD-10-CM

## 2024-01-18 MED ORDER — ROSUVASTATIN CALCIUM 10 MG PO TABS: 10.0000 mg | ORAL_TABLET | Freq: Every day | ORAL | 3 refills | Status: AC

## 2024-01-18 MED ORDER — NITROGLYCERIN 0.4 MG SL SUBL: SUBLINGUAL_TABLET | SUBLINGUAL | 6 refills | Status: AC

## 2024-01-18 MED ORDER — REPATHA SURECLICK 140 MG/ML ~~LOC~~ SOAJ: SUBCUTANEOUS | 3 refills | Status: AC

## 2024-01-18 MED ORDER — OMEGA-3-ACID ETHYL ESTERS 1 G PO CAPS: 1.0000 | ORAL_CAPSULE | Freq: Two times a day (BID) | ORAL | 3 refills | Status: AC

## 2024-01-18 MED ORDER — EZETIMIBE 10 MG PO TABS: 10.0000 mg | ORAL_TABLET | Freq: Every day | ORAL | 3 refills | Status: AC

## 2024-01-18 MED ORDER — CLOPIDOGREL BISULFATE 75 MG PO TABS: 75.0000 mg | ORAL_TABLET | Freq: Every day | ORAL | 3 refills | Status: AC

## 2024-01-18 NOTE — Patient Instructions (Signed)

## 2024-01-30 DIAGNOSIS — E669 Obesity, unspecified: Secondary | ICD-10-CM | POA: Diagnosis not present

## 2024-01-31 ENCOUNTER — Encounter: Payer: Self-pay | Admitting: Radiology

## 2024-02-23 ENCOUNTER — Telehealth: Payer: Self-pay | Admitting: Cardiovascular Disease

## 2024-02-23 MED ORDER — METOPROLOL TARTRATE 25 MG PO TABS: 12.5000 mg | ORAL_TABLET | Freq: Two times a day (BID) | ORAL | 3 refills | Status: AC

## 2024-02-23 NOTE — Telephone Encounter (Signed)
 Refill sent.

## 2024-02-23 NOTE — Telephone Encounter (Signed)
*  STAT* If patient is at the pharmacy, call can be transferred to refill team.   1. Which medications need to be refilled? (please list name of each medication and dose if known)   metoprolol  tartrate (LOPRESSOR ) 25 MG tablet   2. Would you like to learn more about the convenience, safety, & potential cost savings by using the [facility]?   3. Are you open to using the [facility] (Type [facility]. ).  4. Which pharmacy/location (including street and city if local pharmacy) is medication to be sent to?  [id number] - [place], [place] - [address]   5. Do they need a 30 day or 90 day supply?   90 day  Patient stated he has 2 days left of this medication.

## 2024-04-27 ENCOUNTER — Encounter: Payer: Self-pay | Admitting: Cardiovascular Disease

## 2024-04-28 ENCOUNTER — Other Ambulatory Visit (HOSPITAL_COMMUNITY): Payer: Self-pay

## 2024-05-03 ENCOUNTER — Telehealth: Payer: Self-pay | Admitting: Pharmacy Technician

## 2024-05-03 ENCOUNTER — Other Ambulatory Visit (HOSPITAL_COMMUNITY): Payer: Self-pay

## 2024-05-03 NOTE — Telephone Encounter (Signed)
" ° °  Pharmacy Patient Advocate Encounter   Received notification from Patient Advice Request messages that prior authorization for [facility] is required/requested.   Insurance verification completed.   The patient is insured through [employer].   Per test claim: PA required; PA submitted to above mentioned insurance via Latent Key/confirmation #/EOC [id number] Status is pending  "

## 2024-05-03 NOTE — Telephone Encounter (Signed)
" ° ° °  Pharmacy Patient Advocate Encounter   Received notification from Patient Advice Request messages that prior authorization for REPATHA  is required/requested.   Insurance verification completed.   The patient is insured through [employer].   Per test claim: PA required; PA submitted to above mentioned insurance via Latent Key/confirmation #/EOC [id number] Status is pending  "

## 2024-05-04 NOTE — Telephone Encounter (Signed)
 Pharmacy Patient Advocate Encounter  Received notification from [facility] that Prior Authorization for repatha  has been APPROVED from 05/03/24 to 05/03/25   PA #/Case ID/Reference #: [id number]

## 2024-05-04 NOTE — Telephone Encounter (Signed)
 Pharmacy Patient Advocate Encounter  Received notification from [facility] that Prior Authorization for wegovy has been APPROVED from 05/03/24 to 05/03/25   PA #/Case ID/Reference #: [id number]
